# Patient Record
Sex: Male | Born: 1960 | Race: White | Hispanic: No | Marital: Single | Smoking: Never smoker
Health system: Southern US, Community
[De-identification: ages and names within clinical notes are randomized; demographics above are authoritative.]

## PROBLEM LIST (undated history)

## (undated) DIAGNOSIS — I10 Essential (primary) hypertension: Secondary | ICD-10-CM

## (undated) DIAGNOSIS — E119 Type 2 diabetes mellitus without complications: Secondary | ICD-10-CM

## (undated) DIAGNOSIS — E785 Hyperlipidemia, unspecified: Secondary | ICD-10-CM

## (undated) DIAGNOSIS — T7840XA Allergy, unspecified, initial encounter: Secondary | ICD-10-CM

## (undated) HISTORY — DX: Hyperlipidemia, unspecified: E78.5

## (undated) HISTORY — DX: Allergy, unspecified, initial encounter: T78.40XA

## (undated) HISTORY — DX: Essential (primary) hypertension: I10

---

## 1997-10-08 ENCOUNTER — Ambulatory Visit (HOSPITAL_COMMUNITY): Admission: RE | Admit: 1997-10-08 | Discharge: 1997-10-08 | Payer: Self-pay | Admitting: Obstetrics and Gynecology

## 1997-12-28 ENCOUNTER — Inpatient Hospital Stay (HOSPITAL_COMMUNITY): Admission: AD | Admit: 1997-12-28 | Discharge: 1997-12-28 | Payer: Self-pay | Admitting: Obstetrics and Gynecology

## 1997-12-29 ENCOUNTER — Inpatient Hospital Stay (HOSPITAL_COMMUNITY): Admission: AD | Admit: 1997-12-29 | Discharge: 1997-12-31 | Payer: Self-pay | Admitting: Obstetrics and Gynecology

## 1998-05-28 ENCOUNTER — Other Ambulatory Visit: Admission: RE | Admit: 1998-05-28 | Discharge: 1998-05-28 | Payer: Self-pay | Admitting: Obstetrics and Gynecology

## 2004-05-01 HISTORY — PX: VASECTOMY: SHX75

## 2012-09-17 ENCOUNTER — Encounter: Payer: Self-pay | Admitting: Family Medicine

## 2012-09-17 ENCOUNTER — Ambulatory Visit (INDEPENDENT_AMBULATORY_CARE_PROVIDER_SITE_OTHER): Payer: BC Managed Care – PPO | Admitting: Family Medicine

## 2012-09-17 VITALS — BP 146/96 | HR 76 | Temp 98.3°F | Resp 18 | Ht 66.5 in | Wt 208.0 lb

## 2012-09-17 DIAGNOSIS — T7840XA Allergy, unspecified, initial encounter: Secondary | ICD-10-CM | POA: Insufficient documentation

## 2012-09-17 DIAGNOSIS — H04309 Unspecified dacryocystitis of unspecified lacrimal passage: Secondary | ICD-10-CM

## 2012-09-17 DIAGNOSIS — E1169 Type 2 diabetes mellitus with other specified complication: Secondary | ICD-10-CM | POA: Insufficient documentation

## 2012-09-17 DIAGNOSIS — I152 Hypertension secondary to endocrine disorders: Secondary | ICD-10-CM | POA: Insufficient documentation

## 2012-09-17 DIAGNOSIS — I10 Essential (primary) hypertension: Secondary | ICD-10-CM | POA: Insufficient documentation

## 2012-09-17 DIAGNOSIS — H04302 Unspecified dacryocystitis of left lacrimal passage: Secondary | ICD-10-CM

## 2012-09-17 DIAGNOSIS — E785 Hyperlipidemia, unspecified: Secondary | ICD-10-CM | POA: Insufficient documentation

## 2012-09-17 MED ORDER — SULFAMETHOXAZOLE-TRIMETHOPRIM 800-160 MG PO TABS: 1.0000 | ORAL_TABLET | Freq: Two times a day (BID) | ORAL | Status: DC

## 2012-09-17 NOTE — Progress Notes (Signed)
  Subjective:    Patient ID: Joseph [name], male    DOB: 09-09-1960, 52 y.o.   MRN: [id number]  HPI Patient reports 2-3 days of swelling and pain in his left eye. The upper eyelid is erythematous, edematous and swollen. The medial corner of the eye is swollen with a palpable swollen lacrimal duct. There is no conjunctivitis, blurred vision, or pain with extraocular movement. Past Medical History  Diagnosis Date  . Hyperlipidemia   . Hypertension   . Allergy    No current outpatient prescriptions on file prior to visit.   No current facility-administered medications on file prior to visit.   No Known Allergies History   Social History  . Marital Status: Married    Spouse Name: N/A    Number of Children: N/A  . Years of Education: N/A   Occupational History  . Not on file.   Social History Main Topics  . Smoking status: Never Smoker   . Smokeless tobacco: Never Used  . Alcohol Use: No  . Drug Use: No  . Sexually Active: Not on file   Other Topics Concern  . Not on file   Social History Narrative  . No narrative on file      Review of Systems  All other systems reviewed and are negative.       Objective:   Physical Exam  Vitals reviewed. Eyes: Conjunctivae and EOM are normal. Pupils are equal, round, and reactive to light. Right eye exhibits no chemosis, no discharge, no exudate and no hordeolum. Left eye exhibits no chemosis, no discharge, no exudate and no hordeolum.    Cardiovascular: Normal rate and regular rhythm.   Pulmonary/Chest: Effort normal and breath sounds normal.  There is erythema and swelling in the circled area.  The left upper eyelid is also edematous, erythematous, and swollen        Assessment & Plan:  1. Dacrocystitis, left Bactrim double strength tablets 1 by mouth twice a day for 10 days. He is also to apply warm moist compresses to the area 3 times a day. He is to recheck in 48 hours, sooner if worse. He may need probing of the upper  canniculi if swelling persists.

## 2012-09-19 ENCOUNTER — Telehealth: Payer: Self-pay | Admitting: Family Medicine

## 2012-09-19 NOTE — Telephone Encounter (Signed)
Great. Stay on ABx.

## 2012-09-19 NOTE — Telephone Encounter (Signed)
..  Patient aware per vm 

## 2012-09-30 ENCOUNTER — Telehealth: Payer: Self-pay | Admitting: Family Medicine

## 2012-10-01 NOTE — Telephone Encounter (Signed)
Pt called and told to apply compresses and he is going to call and make eye appt.

## 2012-10-01 NOTE — Telephone Encounter (Signed)
Apply warm compresses TID and I'd like him to see an eye doctor for possible nasolacrimal duct obstruction.  If it gets really bad, come in ASAP.  Does he have an eye doctor?

## 2012-10-04 ENCOUNTER — Encounter: Payer: Self-pay | Admitting: Family Medicine

## 2012-10-04 ENCOUNTER — Ambulatory Visit (INDEPENDENT_AMBULATORY_CARE_PROVIDER_SITE_OTHER): Payer: BC Managed Care – PPO | Admitting: Family Medicine

## 2012-10-04 VITALS — BP 149/100 | HR 68 | Temp 98.1°F | Resp 18 | Ht 66.5 in | Wt 206.0 lb

## 2012-10-04 DIAGNOSIS — I10 Essential (primary) hypertension: Secondary | ICD-10-CM

## 2012-10-04 DIAGNOSIS — L0291 Cutaneous abscess, unspecified: Secondary | ICD-10-CM

## 2012-10-04 DIAGNOSIS — L0390 Cellulitis, unspecified: Secondary | ICD-10-CM

## 2012-10-04 MED ORDER — SULFAMETHOXAZOLE-TRIMETHOPRIM 800-160 MG PO TABS: 1.0000 | ORAL_TABLET | Freq: Two times a day (BID) | ORAL | Status: DC

## 2012-10-04 NOTE — Progress Notes (Signed)
Subjective:    Patient ID: Joseph [name], male    DOB: 1960-05-15, 52 y.o.   MRN: [id number]  HPI 09/17/12 Patient reports 2-3 days of swelling and pain in his left eye. The upper eyelid is erythematous, edematous and swollen. The medial corner of the eye is swollen with a palpable swollen lacrimal duct. There is no conjunctivitis, blurred vision, or pain with extraocular movement.  Therefore, I diagnosed him with: 1. Dacrocystitis, left Bactrim double strength tablets 1 by mouth twice a day for 10 days. He is also to apply warm moist compresses to the area 3 times a day. He is to recheck in 48 hours, sooner if worse. He may need probing of the upper canniculi if swelling persists.  10/04/12 The redness and swelling is 90% better. He still has a trace amount of erythema just superior to the corner of the left eye. There is no palpable swelling there is a small whitehead in the center of the erythema. Therefore I did not feel that this was a nasolacrimal duct obstruction as much is possible cellulitis. There is still some residual erythema and irritation. There is no conjunctival erythema. He has no pain with extraocular movement and he has no blurred vision. Past Medical History  Diagnosis Date  . Hyperlipidemia   . Hypertension   . Allergy    Current Outpatient Prescriptions on File Prior to Visit  Medication Sig Dispense Refill  . aspirin 81 MG tablet Take 81 mg by mouth daily.      Marland Kitchen atorvastatin (LIPITOR) 80 MG tablet Take 80 mg by mouth at bedtime.      . fluticasone (FLONASE) 50 MCG/ACT nasal spray Place 2 sprays into the nose daily.      . Glucosamine 500 MG CAPS Take 1 capsule by mouth daily.      Marland Kitchen lisinopril-hydrochlorothiazide (PRINZIDE,ZESTORETIC) 20-12.5 MG per tablet Take 1 tablet by mouth daily.      Marland Kitchen loratadine (CLARITIN) 10 MG tablet Take 10 mg by mouth daily as needed for allergies.      . Lysine 500 MG CAPS Take 1 capsule by mouth daily.      . Multiple Vitamins-Minerals  (MULTIVITAMIN PO) Take by mouth.      . sulfamethoxazole-trimethoprim (BACTRIM DS,SEPTRA DS) 800-160 MG per tablet Take 1 tablet by mouth 2 (two) times daily.  20 tablet  0   No current facility-administered medications on file prior to visit.   No Known Allergies History   Social History  . Marital Status: Married    Spouse Name: N/A    Number of Children: N/A  . Years of Education: N/A   Occupational History  . Not on file.   Social History Main Topics  . Smoking status: Never Smoker   . Smokeless tobacco: Never Used  . Alcohol Use: No  . Drug Use: No  . Sexually Active: Not on file   Other Topics Concern  . Not on file   Social History Narrative  . No narrative on file      Review of Systems  All other systems reviewed and are negative.       Objective:   Physical Exam  Vitals reviewed. Eyes: Conjunctivae and EOM are normal. Pupils are equal, round, and reactive to light. Right eye exhibits no chemosis, no discharge, no exudate and no hordeolum. Left eye exhibits no chemosis, no discharge, no exudate and no hordeolum.    Cardiovascular: Normal rate and regular rhythm.   Pulmonary/Chest: Effort normal and  breath sounds normal.  There is erythema in the circled area.          Assessment & Plan:  1. Hypertension Increase the Prinzide 20/12.5-2 tablets by mouth daily.  Recheck blood pressure in 2 weeks.  2. Cellulitis Bactrim double strength tablets 1 by mouth twice a day for an additional 10 days.

## 2012-10-07 ENCOUNTER — Ambulatory Visit: Payer: BC Managed Care – PPO | Admitting: Family Medicine

## 2012-10-21 ENCOUNTER — Encounter: Payer: Self-pay | Admitting: Family Medicine

## 2012-10-21 ENCOUNTER — Ambulatory Visit (INDEPENDENT_AMBULATORY_CARE_PROVIDER_SITE_OTHER): Payer: BC Managed Care – PPO | Admitting: Family Medicine

## 2012-10-21 VITALS — BP 142/90 | HR 72 | Temp 98.1°F | Resp 16 | Wt 209.0 lb

## 2012-10-21 DIAGNOSIS — I10 Essential (primary) hypertension: Secondary | ICD-10-CM

## 2012-10-21 DIAGNOSIS — F411 Generalized anxiety disorder: Secondary | ICD-10-CM

## 2012-10-21 DIAGNOSIS — F418 Other specified anxiety disorders: Secondary | ICD-10-CM

## 2012-10-21 MED ORDER — METOPROLOL SUCCINATE ER 25 MG PO TB24: 25.0000 mg | ORAL_TABLET | Freq: Every day | ORAL | Status: DC

## 2012-10-21 NOTE — Progress Notes (Signed)
  Subjective:    Patient ID: Joseph [name], male    DOB: Oct 11, 1960, 52 y.o.   MRN: [id number]  HPI  Patient is currently taking Zestoretic 20/12.5 tablets 2 tablets by mouth daily. His blood pressure on ranging 140-160 over 90s. He is under a tremendous amount of stress and anxiety. His marriage is breaking apart. They're about to start couples counseling next week. He thinks this is a large part of his blood pressure issues. His previously been well controlled prior to this. He denies any depression. He denies panic attacks. He generally feels anxious. Past Medical History  Diagnosis Date  . Hyperlipidemia   . Hypertension   . Allergy    Current Outpatient Prescriptions on File Prior to Visit  Medication Sig Dispense Refill  . aspirin 81 MG tablet Take 81 mg by mouth daily.      Marland Kitchen atorvastatin (LIPITOR) 80 MG tablet Take 80 mg by mouth at bedtime.      . fluticasone (FLONASE) 50 MCG/ACT nasal spray Place 2 sprays into the nose daily.      . Glucosamine 500 MG CAPS Take 1 capsule by mouth daily.      Marland Kitchen lisinopril-hydrochlorothiazide (PRINZIDE,ZESTORETIC) 20-12.5 MG per tablet Take 2 tablets by mouth daily.       Marland Kitchen loratadine (CLARITIN) 10 MG tablet Take 10 mg by mouth daily as needed for allergies.      . Lysine 500 MG CAPS Take 1 capsule by mouth daily.      . Multiple Vitamins-Minerals (MULTIVITAMIN PO) Take by mouth.       No current facility-administered medications on file prior to visit.   No Known Allergies History   Social History  . Marital Status: Married    Spouse Name: N/A    Number of Children: N/A  . Years of Education: N/A   Occupational History  . Not on file.   Social History Main Topics  . Smoking status: Never Smoker   . Smokeless tobacco: Never Used  . Alcohol Use: No  . Drug Use: No  . Sexually Active: Not on file   Other Topics Concern  . Not on file   Social History Narrative  . No narrative on file     Review of Systems  All other systems  reviewed and are negative.       Objective:   Physical Exam  Vitals reviewed. Constitutional: He appears well-developed and well-nourished.  Cardiovascular: Normal rate, regular rhythm, normal heart sounds and intact distal pulses.  Exam reveals no gallop and no friction rub.   No murmur heard. Pulmonary/Chest: Effort normal and breath sounds normal. No respiratory distress. He has no wheezes. He has no rales. He exhibits no tenderness.          Assessment & Plan:  1. HTN (hypertension) At metoprolol XL 25 mg by mouth daily to try to address his elevated blood pressure as well as possibly help some with the situational anxiety. We will hold off on SSRIs and anxiolytics at the present time.  2. Situational anxiety Encouraged the patient to follow up with marriage counseling they're pursuing.  Recheck blood pressure in 2 weeks.

## 2012-12-16 ENCOUNTER — Telehealth: Payer: Self-pay | Admitting: Family Medicine

## 2012-12-16 MED ORDER — METOPROLOL SUCCINATE ER 25 MG PO TB24: 25.0000 mg | ORAL_TABLET | Freq: Every day | ORAL | Status: DC

## 2012-12-16 NOTE — Telephone Encounter (Signed)
Metoprolol Succ ER 25 mg tab 1 QD #90

## 2012-12-16 NOTE — Telephone Encounter (Signed)
Rx Refilled  

## 2012-12-31 ENCOUNTER — Telehealth: Payer: Self-pay | Admitting: Family Medicine

## 2012-12-31 MED ORDER — ATORVASTATIN CALCIUM 80 MG PO TABS: 80.0000 mg | ORAL_TABLET | Freq: Every day | ORAL | Status: DC

## 2012-12-31 NOTE — Telephone Encounter (Signed)
Atorvastatin 80 mg tab 1 QHS #90

## 2013-01-09 ENCOUNTER — Other Ambulatory Visit: Payer: Self-pay | Admitting: Family Medicine

## 2013-01-09 DIAGNOSIS — Z0000 Encounter for general adult medical examination without abnormal findings: Secondary | ICD-10-CM

## 2013-01-14 ENCOUNTER — Other Ambulatory Visit: Payer: BC Managed Care – PPO

## 2013-01-14 DIAGNOSIS — Z0000 Encounter for general adult medical examination without abnormal findings: Secondary | ICD-10-CM

## 2013-01-14 LAB — COMPREHENSIVE METABOLIC PANEL
BUN: 16 mg/dL (ref 6–23)
CO2: 30 mEq/L (ref 19–32)
Glucose, Bld: 101 mg/dL — ABNORMAL HIGH (ref 70–99)
Sodium: 140 mEq/L (ref 135–145)
Total Bilirubin: 0.8 mg/dL (ref 0.3–1.2)
Total Protein: 6.7 g/dL (ref 6.0–8.3)

## 2013-01-14 LAB — CBC WITH DIFFERENTIAL/PLATELET
Eosinophils Absolute: 0.1 10*3/uL (ref 0.0–0.7)
Eosinophils Relative: 2 % (ref 0–5)
HCT: 43.2 % (ref 39.0–52.0)
Hemoglobin: 15.2 g/dL (ref 13.0–17.0)
Lymphs Abs: 1.9 10*3/uL (ref 0.7–4.0)
MCH: 30.5 pg (ref 26.0–34.0)
MCV: 86.6 fL (ref 78.0–100.0)
Monocytes Absolute: 0.8 10*3/uL (ref 0.1–1.0)
Monocytes Relative: 11 % (ref 3–12)
Neutrophils Relative %: 58 % (ref 43–77)
RBC: 4.99 MIL/uL (ref 4.22–5.81)

## 2013-01-14 LAB — LIPID PANEL
Cholesterol: 113 mg/dL (ref 0–200)
Triglycerides: 144 mg/dL (ref <150)
VLDL: 29 mg/dL (ref 0–40)

## 2013-01-15 ENCOUNTER — Other Ambulatory Visit: Payer: Self-pay | Admitting: Family Medicine

## 2013-01-17 ENCOUNTER — Encounter: Payer: Self-pay | Admitting: Family Medicine

## 2013-01-17 ENCOUNTER — Ambulatory Visit (INDEPENDENT_AMBULATORY_CARE_PROVIDER_SITE_OTHER): Payer: BC Managed Care – PPO | Admitting: Family Medicine

## 2013-01-17 VITALS — BP 110/70 | HR 60 | Temp 97.9°F | Resp 14 | Ht 68.0 in | Wt 205.0 lb

## 2013-01-17 DIAGNOSIS — Z23 Encounter for immunization: Secondary | ICD-10-CM

## 2013-01-17 DIAGNOSIS — Z0000 Encounter for general adult medical examination without abnormal findings: Secondary | ICD-10-CM

## 2013-01-17 NOTE — Progress Notes (Signed)
Subjective:    Patient ID: Joseph [name], male    DOB: 09-Jul-1960, 52 y.o.   MRN: [id number]  HPI  Patient is a very pleasant 52 year old Caucasian male who is here today for his complete physical exam. Since I have last seen him he has changed his lifestyle dramatically. He is exercising every day. He is running. His blood pressure is much improved at 110/78 he is watching his diet. He is decreased snacking. Overall he is lost approximately 20 pounds. His most recent lab work is listed below: Appointment on 01/14/2013  Component Date Value Range Status  . WBC 01/14/2013 6.6  4.0 - 10.5 K/uL Final  . RBC 01/14/2013 4.99  4.22 - 5.81 MIL/uL Final  . Hemoglobin 01/14/2013 15.2  13.0 - 17.0 g/dL Final  . HCT 82/95/6213 43.2  39.0 - 52.0 % Final  . MCV 01/14/2013 86.6  78.0 - 100.0 fL Final  . MCH 01/14/2013 30.5  26.0 - 34.0 pg Final  . MCHC 01/14/2013 35.2  30.0 - 36.0 g/dL Final  . RDW 08/65/7846 13.9  11.5 - 15.5 % Final  . Platelets 01/14/2013 294  150 - 400 K/uL Final  . Neutrophils Relative % 01/14/2013 58  43 - 77 % Final  . Neutro Abs 01/14/2013 3.8  1.7 - 7.7 K/uL Final  . Lymphocytes Relative 01/14/2013 29  12 - 46 % Final  . Lymphs Abs 01/14/2013 1.9  0.7 - 4.0 K/uL Final  . Monocytes Relative 01/14/2013 11  3 - 12 % Final  . Monocytes Absolute 01/14/2013 0.8  0.1 - 1.0 K/uL Final  . Eosinophils Relative 01/14/2013 2  0 - 5 % Final  . Eosinophils Absolute 01/14/2013 0.1  0.0 - 0.7 K/uL Final  . Basophils Relative 01/14/2013 0  0 - 1 % Final  . Basophils Absolute 01/14/2013 0.0  0.0 - 0.1 K/uL Final  . Smear Review 01/14/2013 Criteria for review not met   Final  . Sodium 01/14/2013 140  135 - 145 mEq/L Final  . Potassium 01/14/2013 3.3* 3.5 - 5.3 mEq/L Final  . Chloride 01/14/2013 101  96 - 112 mEq/L Final  . CO2 01/14/2013 30  19 - 32 mEq/L Final  . Glucose, Bld 01/14/2013 101* 70 - 99 mg/dL Final  . BUN 96/29/5284 16  6 - 23 mg/dL Final  . Creat 13/24/4010 1.04  0.50 - 1.35  mg/dL Final  . Total Bilirubin 01/14/2013 0.8  0.3 - 1.2 mg/dL Final  . Alkaline Phosphatase 01/14/2013 55  39 - 117 U/L Final  . AST 01/14/2013 21  0 - 37 U/L Final  . ALT 01/14/2013 26  0 - 53 U/L Final  . Total Protein 01/14/2013 6.7  6.0 - 8.3 g/dL Final  . Albumin 27/25/3664 4.1  3.5 - 5.2 g/dL Final  . Calcium 40/34/7425 9.4  8.4 - 10.5 mg/dL Final  . Cholesterol 95/63/8756 113  0 - 200 mg/dL Final   Comment: ATP III Classification:                                < 200        mg/dL        Desirable                               200 - 239     mg/dL  Borderline High                               >= 240        mg/dL        High                             . Triglycerides 01/14/2013 144  <150 mg/dL Final  . HDL 13/24/4010 40  >39 mg/dL Final  . Total CHOL/HDL Ratio 01/14/2013 2.8   Final  . VLDL 01/14/2013 29  0 - 40 mg/dL Final  . LDL Cholesterol 01/14/2013 44  0 - 99 mg/dL Final   Comment:                            Total Cholesterol/HDL Ratio:CHD Risk                                                 Coronary Heart Disease Risk Table                                                                 Men       Women                                   1/2 Average Risk              3.4        3.3                                       Average Risk              5.0        4.4                                    2X Average Risk              9.6        7.1                                    3X Average Risk             23.4       11.0                          Use the calculated Patient Ratio above and the CHD Risk table                           to determine the patient's CHD Risk.  ATP III Classification (LDL):                                < 100        mg/dL         Optimal                               100 - 129     mg/dL         Near or Above Optimal                               130 - 159     mg/dL         Borderline High                               160 -  189     mg/dL         High                                > 190        mg/dL         Very High                             . PSA 01/14/2013 1.57  <=4.00 ng/mL Final   Comment: Test Methodology: ECLIA PSA (Electrochemiluminescence Immunoassay)                                                     For PSA values from 2.5-4.0, particularly in younger men <60 years                          old, the AUA and NCCN suggest testing for % Free PSA (3515) and                          evaluation of the rate of increase in PSA (PSA velocity).   Past Medical History  Diagnosis Date  . Hyperlipidemia   . Hypertension   . Allergy    Current Outpatient Prescriptions on File Prior to Visit  Medication Sig Dispense Refill  . aspirin 81 MG tablet Take 81 mg by mouth daily.      Marland Kitchen atorvastatin (LIPITOR) 80 MG tablet Take 1 tablet (80 mg total) by mouth at bedtime.  90 tablet  1  . fluticasone (FLONASE) 50 MCG/ACT nasal spray Place 2 sprays into the nose daily.      . Glucosamine 500 MG CAPS Take 1 capsule by mouth daily.      Marland Kitchen lisinopril-hydrochlorothiazide (PRINZIDE,ZESTORETIC) 20-12.5 MG per tablet TAKE 2 TABLETS BY MOUTH DAILY  180 tablet  3  . loratadine (CLARITIN) 10 MG tablet Take 10 mg by mouth daily as needed for allergies.      . Lysine 500 MG CAPS Take 1 capsule by mouth daily.      . metoprolol succinate (  TOPROL-XL) 25 MG 24 hr tablet Take 1 tablet (25 mg total) by mouth daily.  90 tablet  1  . Multiple Vitamins-Minerals (MULTIVITAMIN PO) Take by mouth.       No current facility-administered medications on file prior to visit.   No Known Allergies History   Social History  . Marital Status: Married    Spouse Name: N/A    Number of Children: N/A  . Years of Education: N/A   Occupational History  . Not on file.   Social History Main Topics  . Smoking status: Never Smoker   . Smokeless tobacco: Never Used  . Alcohol Use: No  . Drug Use: No  . Sexual Activity: Yes     Comment:  married to [place].  Three kids.  exercising daily.   Other Topics Concern  . Not on file   Social History Narrative  . No narrative on file   Family History  Problem Relation Age of Onset  . Heart disease Mother   . Heart disease Father   . Stroke Father   . Hypertension Sister      Review of Systems  All other systems reviewed and are negative.       Objective:   Physical Exam  Vitals reviewed. Constitutional: He is oriented to person, place, and time. He appears well-developed and well-nourished. No distress.  HENT:  Head: Normocephalic and atraumatic.  Right Ear: External ear normal.  Left Ear: External ear normal.  Nose: Nose normal.  Mouth/Throat: Oropharynx is clear and moist. No oropharyngeal exudate.  Eyes: Conjunctivae and EOM are normal. Pupils are equal, round, and reactive to light. Right eye exhibits no discharge. Left eye exhibits no discharge. No scleral icterus.  Neck: Normal range of motion. Neck supple. No JVD present. No tracheal deviation present. No thyromegaly present.  Cardiovascular: Normal rate, regular rhythm, normal heart sounds and intact distal pulses.  Exam reveals no gallop and no friction rub.   No murmur heard. Pulmonary/Chest: Effort normal and breath sounds normal. No stridor. No respiratory distress. He has no wheezes. He has no rales. He exhibits no tenderness.  Abdominal: Soft. Bowel sounds are normal. He exhibits no distension and no mass. There is no tenderness. There is no rebound and no guarding.  Genitourinary: Rectum normal and prostate normal.  Musculoskeletal: Normal range of motion. He exhibits no edema and no tenderness.  Lymphadenopathy:    He has no cervical adenopathy.  Neurological: He is alert and oriented to person, place, and time. He has normal reflexes. He displays normal reflexes. No cranial nerve deficit. He exhibits normal muscle tone. Coordination normal.  Skin: Skin is warm. No rash noted. He is not diaphoretic.  No erythema. No pallor.  Psychiatric: He has a normal mood and affect. His behavior is normal. Judgment and thought content normal.          Assessment & Plan:  1. Need for prophylactic vaccination and inoculation against influenza - Flu Vaccine QUAD 36+ mos IM  2. Routine general medical examination at a health care facility Patient's tetanus shot is up-to-date. He was given his flu shot today. His prostate exam is normal. His blood pressure is much improved. I recommend weaning him off the Toprol.  He will keep an eye on his blood pressure at home and call me if it gets higher than 140/90.  His cholesterol is excellent. I recommended decreasing atorvastatin to 40 mg by mouth daily. He will break the 80 mg pills in half.  I applauded the patient on his lifestyle changes, it is paying off. I will schedule him for colonoscopy for screening for colon cancer.

## 2013-02-07 ENCOUNTER — Encounter: Payer: Self-pay | Admitting: Internal Medicine

## 2013-03-24 ENCOUNTER — Ambulatory Visit (AMBULATORY_SURGERY_CENTER): Payer: Self-pay | Admitting: *Deleted

## 2013-03-24 VITALS — Ht 68.0 in | Wt 207.2 lb

## 2013-03-24 DIAGNOSIS — Z1211 Encounter for screening for malignant neoplasm of colon: Secondary | ICD-10-CM

## 2013-03-24 MED ORDER — NA SULFATE-K SULFATE-MG SULF 17.5-3.13-1.6 GM/177ML PO SOLN: 1.0000 | Freq: Once | ORAL | Status: DC

## 2013-03-24 NOTE — Progress Notes (Signed)
No allergies to eggs or soy. No problems with anesthesia.  

## 2013-03-25 ENCOUNTER — Encounter: Payer: Self-pay | Admitting: Internal Medicine

## 2013-03-31 ENCOUNTER — Encounter: Payer: Self-pay | Admitting: Internal Medicine

## 2013-03-31 ENCOUNTER — Ambulatory Visit (AMBULATORY_SURGERY_CENTER): Payer: BC Managed Care – PPO | Admitting: Internal Medicine

## 2013-03-31 VITALS — BP 134/92 | HR 59 | Temp 98.3°F | Resp 23 | Ht 68.0 in | Wt 207.0 lb

## 2013-03-31 DIAGNOSIS — Z1211 Encounter for screening for malignant neoplasm of colon: Secondary | ICD-10-CM

## 2013-03-31 DIAGNOSIS — K5730 Diverticulosis of large intestine without perforation or abscess without bleeding: Secondary | ICD-10-CM

## 2013-03-31 MED ORDER — SODIUM CHLORIDE 0.9 % IV SOLN: 500.0000 mL | INTRAVENOUS | Status: DC

## 2013-03-31 NOTE — Op Note (Signed)
[facility] [address] [place], [address]   COLONOSCOPY PROCEDURE REPORT  PATIENT: Joseph [name], Joseph [name]  MR#: [id number] BIRTHDATE: Oct 15, 1960 , 52  yrs. old GENDER: Male ENDOSCOPIST: Iva Boop, MD, [facility] REFERRED XB:[id number] Tanya Nones, M.D. PROCEDURE DATE:  03/31/2013 PROCEDURE:   Colonoscopy, screening First Screening Colonoscopy - Avg.  risk and is 50 yrs.  old or older Yes.  Prior Negative Screening - Now for repeat screening. N/A  History of Adenoma - Now for follow-up colonoscopy & has been > or = to 3 yrs.  N/A  Polyps Removed Today? No.  Recommend repeat exam, <10 yrs? No. ASA CLASS:   Class II INDICATIONS:average risk screening and first colonoscopy. MEDICATIONS: propofol (Diprivan) 300mg  IV, MAC sedation, administered by CRNA, and These medications were titrated to patient response per physician's verbal order  DESCRIPTION OF PROCEDURE:   After the risks benefits and alternatives of the procedure were thoroughly explained, informed consent was obtained.  A digital rectal exam revealed no abnormalities of the rectum, A digital rectal exam revealed no prostatic nodules, and A digital rectal exam revealed the prostate was not enlarged.   The LB [id number]  endoscope was introduced through the anus and advanced to the cecum, which was identified by both the appendix and ileocecal valve. No adverse events experienced.   The quality of the prep was excellent using Suprep  The instrument was then slowly withdrawn as the colon was fully examined.      COLON FINDINGS: Moderate diverticulosis was noted in the sigmoid colon.   The colon mucosa was otherwise normal.  Retroflexed views revealed no abnormalities. The time to cecum=2 minutes 13 seconds. Withdrawal time=8 minutes 19 seconds.  The scope was withdrawn and the procedure completed. COMPLICATIONS: There were no complications.  ENDOSCOPIC IMPRESSION: 1.   Moderate diverticulosis was noted in the  sigmoid colon 2.   The colon mucosa was otherwise normal  RECOMMENDATIONS: Repeat colonoscopy 10 years - 2024   eSigned:  Iva Boop, MD, [facility] 03/31/2013 1:49 PM cc: Lynnea Ferrier, MD and The Patient

## 2013-03-31 NOTE — Patient Instructions (Addendum)
No polyps or cancer seen today. You do have a condition called diverticulosis - common and not usually a problem. Please read the handout provided.  Next routine colonoscopy in 10 years - 2024.  I appreciate the opportunity to care for you. Iva Boop, MD, FACG  YOU HAD AN ENDOSCOPIC PROCEDURE TODAY AT THE [facility]: Refer to the procedure report that was given to you for any specific questions about what was found during the examination.  If the procedure report does not answer your questions, please call your gastroenterologist to clarify.  If you requested that your care partner not be given the details of your procedure findings, then the procedure report has been included in a sealed envelope for you to review at your convenience later.  YOU SHOULD EXPECT: Some feelings of bloating in the abdomen. Passage of more gas than usual.  Walking can help get rid of the air that was put into your GI tract during the procedure and reduce the bloating. If you had a lower endoscopy (such as a colonoscopy or flexible sigmoidoscopy) you may notice spotting of blood in your stool or on the toilet paper. If you underwent a bowel prep for your procedure, then you may not have a normal bowel movement for a few days.  DIET: Your first meal following the procedure should be a light meal and then it is ok to progress to your normal diet.  A half-sandwich or bowl of soup is an example of a good first meal.  Heavy or fried foods are harder to digest and may make you feel nauseous or bloated.  Likewise meals heavy in dairy and vegetables can cause extra gas to form and this can also increase the bloating.  Drink plenty of fluids but you should avoid alcoholic beverages for 24 hours.  ACTIVITY: Your care partner should take you home directly after the procedure.  You should plan to take it easy, moving slowly for the rest of the day.  You can resume normal activity the day after the procedure however  you should NOT DRIVE or use heavy machinery for 24 hours (because of the sedation medicines used during the test).    SYMPTOMS TO REPORT IMMEDIATELY: A gastroenterologist can be reached at any hour.  During normal business hours, 8:30 AM to 5:00 PM Monday through Friday, call [phone].  After hours and on weekends, please call the GI answering service at [phone] who will take a message and have the physician on call contact you.   Following lower endoscopy (colonoscopy or flexible sigmoidoscopy):  Excessive amounts of blood in the stool  Significant tenderness or worsening of abdominal pains  Swelling of the abdomen that is new, acute  Fever of 100F or higher    FOLLOW UP: If any biopsies were taken you will be contacted by phone or by letter within the next 1-3 weeks.  Call your gastroenterologist if you have not heard about the biopsies in 3 weeks.  Our staff will call the home number listed on your records the next business day following your procedure to check on you and address any questions or concerns that you may have at that time regarding the information given to you following your procedure. This is a courtesy call and so if there is no answer at the home number and we have not heard from you through the emergency physician on call, we will assume that you have returned to your regular daily activities without  incident.  SIGNATURES/CONFIDENTIALITY: You and/or your care partner have signed paperwork which will be entered into your electronic medical record.  These signatures attest to the fact that that the information above on your After Visit Summary has been reviewed and is understood.  Full responsibility of the confidentiality of this discharge information lies with you and/or your care-partner.   Diverticulosis information given.  Next Colonoscopy 10 years-2024

## 2013-03-31 NOTE — Progress Notes (Signed)
Patient did not experience any of the following events: a burn prior to discharge; a fall within the facility; wrong site/side/patient/procedure/implant event; or a hospital transfer or hospital admission upon discharge from the facility. (G8907) Patient did not have preoperative order for IV antibiotic SSI prophylaxis. (G8918)  

## 2013-04-01 ENCOUNTER — Telehealth: Payer: Self-pay | Admitting: *Deleted

## 2013-04-01 NOTE — Telephone Encounter (Signed)
  Follow up Call-  Call back number 03/31/2013  Post procedure Call Back phone  # [phone]  Permission to leave phone message Yes     Patient questions:  Do you have a fever, pain , or abdominal swelling? no Pain Score  0 *  Have you tolerated food without any problems? yes  Have you been able to return to your normal activities? yes  Do you have any questions about your discharge instructions: Diet   no Medications  no Follow up visit  no  Do you have questions or concerns about your Care? no  Actions: * If pain score is 4 or above: No action needed, pain <4.

## 2013-05-01 HISTORY — PX: COLONOSCOPY: SHX174

## 2013-05-30 ENCOUNTER — Telehealth: Payer: Self-pay | Admitting: Family Medicine

## 2013-05-30 MED ORDER — FLUTICASONE PROPIONATE 50 MCG/ACT NA SUSP: 2.0000 | Freq: Every day | NASAL | Status: DC

## 2013-05-30 NOTE — Telephone Encounter (Signed)
Medication refilled per protocol. 

## 2013-06-26 ENCOUNTER — Other Ambulatory Visit: Payer: Self-pay | Admitting: Family Medicine

## 2013-07-21 ENCOUNTER — Other Ambulatory Visit: Payer: Self-pay | Admitting: Family Medicine

## 2013-07-21 MED ORDER — FLUTICASONE PROPIONATE 50 MCG/ACT NA SUSP: 2.0000 | Freq: Every day | NASAL | Status: DC

## 2013-07-21 NOTE — Telephone Encounter (Signed)
Rx Refilled  

## 2013-07-23 ENCOUNTER — Other Ambulatory Visit: Payer: Self-pay | Admitting: Family Medicine

## 2013-07-23 MED ORDER — FLUTICASONE PROPIONATE 50 MCG/ACT NA SUSP: 2.0000 | Freq: Every day | NASAL | Status: DC

## 2013-07-23 NOTE — Telephone Encounter (Signed)
Rx refilled for 90 day supply. 

## 2013-08-04 ENCOUNTER — Ambulatory Visit: Payer: BC Managed Care – PPO | Admitting: Family Medicine

## 2013-08-12 ENCOUNTER — Ambulatory Visit: Payer: BC Managed Care – PPO | Admitting: Family Medicine

## 2013-08-25 ENCOUNTER — Ambulatory Visit (INDEPENDENT_AMBULATORY_CARE_PROVIDER_SITE_OTHER): Payer: BC Managed Care – PPO | Admitting: Family Medicine

## 2013-08-25 ENCOUNTER — Encounter: Payer: Self-pay | Admitting: Family Medicine

## 2013-08-25 ENCOUNTER — Encounter (INDEPENDENT_AMBULATORY_CARE_PROVIDER_SITE_OTHER): Payer: Self-pay

## 2013-08-25 VITALS — BP 130/90 | HR 72 | Temp 96.6°F | Resp 16 | Ht 68.0 in | Wt 218.0 lb

## 2013-08-25 DIAGNOSIS — Q828 Other specified congenital malformations of skin: Secondary | ICD-10-CM

## 2013-08-25 NOTE — Progress Notes (Signed)
   Subjective:    Patient ID: Joseph [name], male    DOB: [birth year], 53 y.o.   MRN: [id number]  HPI Patient presents to the skin tags he would like removed. He has 2 small skin tags on his left side of his neck each approximately 2 mm in size. He has 3 large skin tags in his right inguinal canal one 6 mm in size, one 4 mm in size, and one 3 mm in size. He has two skin tags in his left inguinal canal each approximately 4 mm in size.  There are no concerning features or cancers features about these. Past Medical History  Diagnosis Date  . Hyperlipidemia   . Hypertension   . Allergy    Past Surgical History  Procedure Laterality Date  . Vasectomy  2006   Current Outpatient Prescriptions on File Prior to Visit  Medication Sig Dispense Refill  . aspirin 81 MG tablet Take 81 mg by mouth daily.      Marland Kitchen atorvastatin (LIPITOR) 80 MG tablet TAKE 1 TABLET (80 MG TOTAL) BY MOUTH AT BEDTIME.  90 tablet  1  . fluticasone (FLONASE) 50 MCG/ACT nasal spray Place 2 sprays into both nostrils daily.  48 g  4  . Glucosamine 500 MG CAPS Take 1 capsule by mouth daily.      Marland Kitchen lisinopril-hydrochlorothiazide (PRINZIDE,ZESTORETIC) 20-12.5 MG per tablet TAKE 2 TABLETS BY MOUTH DAILY  180 tablet  3  . loratadine (CLARITIN) 10 MG tablet Take 10 mg by mouth daily as needed for allergies.      . Lysine 500 MG CAPS Take 1 capsule by mouth daily.      . Multiple Vitamins-Minerals (MULTIVITAMIN PO) Take by mouth.       No current facility-administered medications on file prior to visit.   History   Social History  . Marital Status: Married    Spouse Name: N/A    Number of Children: N/A  . Years of Education: N/A   Occupational History  . Not on file.   Social History Main Topics  . Smoking status: Never Smoker   . Smokeless tobacco: Never Used  . Alcohol Use: No  . Drug Use: No  . Sexual Activity: Yes     Comment: married to [place].  Three kids.  exercising daily.   Other Topics Concern  . Not on file    Social History Narrative  . No narrative on file      Review of Systems  All other systems reviewed and are negative.      Objective:   Physical Exam  Vitals reviewed. Cardiovascular: Normal rate and regular rhythm.   Pulmonary/Chest: Effort normal and breath sounds normal.  Skin tags as described and listed in the history of present illness        Assessment & Plan:  1. Accessory skin tags Each on the nine skin tags were anesthetized with 0.1% lidocaine with epinephrine. Using sterile technique, each of the skin tags was then removed via shave biopsy. Hemostasis was achieved with Drysol and a Band-Aid. The patient tolerated the procedure well with no complications. Followup when necessary.

## 2013-10-13 ENCOUNTER — Ambulatory Visit (INDEPENDENT_AMBULATORY_CARE_PROVIDER_SITE_OTHER): Payer: BC Managed Care – PPO | Admitting: Physician Assistant

## 2013-10-13 ENCOUNTER — Encounter: Payer: Self-pay | Admitting: Physician Assistant

## 2013-10-13 VITALS — BP 136/88 | HR 76 | Temp 97.8°F | Resp 18 | Wt 218.0 lb

## 2013-10-13 DIAGNOSIS — J329 Chronic sinusitis, unspecified: Secondary | ICD-10-CM

## 2013-10-13 MED ORDER — AMOXICILLIN-POT CLAVULANATE 875-125 MG PO TABS: 1.0000 | ORAL_TABLET | Freq: Two times a day (BID) | ORAL | Status: DC

## 2013-10-13 NOTE — Progress Notes (Signed)
    Patient ID: Joseph [name] MRN: [id number], DOB: 1960/05/30, 53 y.o. Date of Encounter: 10/13/2013, 3:08 PM    Chief Complaint:  Chief Complaint  Patient presents with  . sinus headache and pain x 2 weeks     HPI: 53 y.o. year old white male reports sinus headache pain for the past 2 weeks. When he bends over he can fill increased pressure and dizzy sensation. Says that he just sneezed and got out yellow mucus. He generally has not been a to get much out feels like it's very congested and stopped up. Has had some cough secondary to drainage down his throat but has had no chest congestion. Had some fever one day. No other known fevers or chills. No significant sore throat or ear pain.     Home Meds:   Outpatient Prescriptions Prior to Visit  Medication Sig Dispense Refill  . aspirin 81 MG tablet Take 81 mg by mouth daily.      Marland Kitchen atorvastatin (LIPITOR) 80 MG tablet TAKE 1 TABLET (80 MG TOTAL) BY MOUTH AT BEDTIME.  90 tablet  1  . fluticasone (FLONASE) 50 MCG/ACT nasal spray Place 2 sprays into both nostrils daily.  48 g  4  . Glucosamine 500 MG CAPS Take 1 capsule by mouth daily.      Marland Kitchen lisinopril-hydrochlorothiazide (PRINZIDE,ZESTORETIC) 20-12.5 MG per tablet TAKE 2 TABLETS BY MOUTH DAILY  180 tablet  3  . loratadine (CLARITIN) 10 MG tablet Take 10 mg by mouth daily as needed for allergies.      . Lysine 500 MG CAPS Take 1 capsule by mouth daily.      . Multiple Vitamins-Minerals (MULTIVITAMIN PO) Take by mouth.       No facility-administered medications prior to visit.    Allergies: No Known Allergies    Review of Systems: See HPI for pertinent ROS. All other ROS negative.    Physical Exam: Blood pressure 136/88, pulse 76, temperature 97.8 F (36.6 C), temperature source Oral, resp. rate 18, weight 218 lb (98.884 kg)., Body mass index is 33.15 kg/(m^2). General: WNWD WM.  Appears in no acute distress. HEENT: Normocephalic, atraumatic, eyes without discharge, sclera  non-icteric, nares are without discharge. Bilateral auditory canals clear, TM's are without perforation, pearly grey and translucent with reflective cone of light bilaterally. Oral cavity moist, posterior pharynx without exudate, erythema, peritonsillar abscess. Positive tenderness with percussion of bilateral maxillary sinuses. Minimal tenderness with percussion of frontal sinuses.  Neck: Supple. No thyromegaly. No lymphadenopathy. Lungs: Clear bilaterally to auscultation without wheezes, rales, or rhonchi. Breathing is unlabored. Heart: Regular rhythm. No murmurs, rubs, or gallops. Msk:  Strength and tone normal for age. Extremities/Skin: Warm and dry. Neuro: Alert and oriented X 3. Moves all extremities spontaneously. Gait is normal. CNII-XII grossly in tact. Psych:  Responds to questions appropriately with a normal affect.     ASSESSMENT AND PLAN:  53 y.o. year old male with  1. Sinusitis - amoxicillin-clavulanate (AUGMENTIN) 875-125 MG per tablet; Take 1 tablet by mouth 2 (two) times daily.  Dispense: 20 tablet; Refill: 0 Complete all of antibiotic. Offered Prednisone but he defers. Says it caused significant insomnia in the past for him. cont over-the-counter decongestants for symptom relief. Follow up if symptoms do not resolve with completion of antibiotic.  Marin Olp [place], [place], [facility] 10/13/2013 3:08 PM

## 2013-12-25 ENCOUNTER — Other Ambulatory Visit: Payer: Self-pay | Admitting: Family Medicine

## 2013-12-25 NOTE — Telephone Encounter (Signed)
Refill appropriate and filled per protocol. 

## 2014-01-07 ENCOUNTER — Other Ambulatory Visit: Payer: Self-pay | Admitting: Family Medicine

## 2014-03-06 ENCOUNTER — Ambulatory Visit (INDEPENDENT_AMBULATORY_CARE_PROVIDER_SITE_OTHER): Payer: BC Managed Care – PPO | Admitting: Family Medicine

## 2014-03-06 DIAGNOSIS — Z23 Encounter for immunization: Secondary | ICD-10-CM

## 2014-04-08 ENCOUNTER — Other Ambulatory Visit: Payer: Self-pay | Admitting: Family Medicine

## 2014-04-20 ENCOUNTER — Encounter: Payer: BC Managed Care – PPO | Admitting: Family Medicine

## 2014-04-28 ENCOUNTER — Other Ambulatory Visit: Payer: BC Managed Care – PPO

## 2014-04-28 DIAGNOSIS — E785 Hyperlipidemia, unspecified: Secondary | ICD-10-CM

## 2014-04-28 DIAGNOSIS — Z79899 Other long term (current) drug therapy: Secondary | ICD-10-CM

## 2014-04-28 DIAGNOSIS — I10 Essential (primary) hypertension: Secondary | ICD-10-CM

## 2014-04-28 DIAGNOSIS — Z0000 Encounter for general adult medical examination without abnormal findings: Secondary | ICD-10-CM

## 2014-04-28 DIAGNOSIS — Z125 Encounter for screening for malignant neoplasm of prostate: Secondary | ICD-10-CM

## 2014-04-28 LAB — LIPID PANEL
CHOL/HDL RATIO: 4.3 ratio
CHOLESTEROL: 145 mg/dL (ref 0–200)
HDL: 34 mg/dL — AB (ref >39)
LDL Cholesterol: 66 mg/dL (ref 0–99)
Triglycerides: 224 mg/dL — ABNORMAL HIGH (ref <150)
VLDL: 45 mg/dL — AB (ref 0–40)

## 2014-04-28 LAB — COMPLETE METABOLIC PANEL WITH GFR
ALT: 52 U/L (ref 0–53)
AST: 28 U/L (ref 0–37)
Albumin: 4.1 g/dL (ref 3.5–5.2)
Alkaline Phosphatase: 59 U/L (ref 39–117)
BUN: 16 mg/dL (ref 6–23)
CALCIUM: 9.4 mg/dL (ref 8.4–10.5)
CO2: 29 meq/L (ref 19–32)
CREATININE: 1.1 mg/dL (ref 0.50–1.35)
Chloride: 101 mEq/L (ref 96–112)
GFR, Est African American: 89 mL/min
GFR, Est Non African American: 77 mL/min
Glucose, Bld: 118 mg/dL — ABNORMAL HIGH (ref 70–99)
Potassium: 3.6 mEq/L (ref 3.5–5.3)
Sodium: 140 mEq/L (ref 135–145)
Total Bilirubin: 0.6 mg/dL (ref 0.2–1.2)
Total Protein: 6.6 g/dL (ref 6.0–8.3)

## 2014-04-28 LAB — CBC WITH DIFFERENTIAL/PLATELET
Basophils Absolute: 0 10*3/uL (ref 0.0–0.1)
Basophils Relative: 0 % (ref 0–1)
Eosinophils Absolute: 0.2 10*3/uL (ref 0.0–0.7)
Eosinophils Relative: 3 % (ref 0–5)
HCT: 45.1 % (ref 39.0–52.0)
Hemoglobin: 15.5 g/dL (ref 13.0–17.0)
LYMPHS PCT: 35 % (ref 12–46)
Lymphs Abs: 2.6 10*3/uL (ref 0.7–4.0)
MCH: 29.8 pg (ref 26.0–34.0)
MCHC: 34.4 g/dL (ref 30.0–36.0)
MCV: 86.6 fL (ref 78.0–100.0)
MPV: 11 fL (ref 8.6–12.4)
Monocytes Absolute: 0.7 10*3/uL (ref 0.1–1.0)
Monocytes Relative: 10 % (ref 3–12)
NEUTROS ABS: 3.8 10*3/uL (ref 1.7–7.7)
NEUTROS PCT: 52 % (ref 43–77)
Platelets: 251 10*3/uL (ref 150–400)
RBC: 5.21 MIL/uL (ref 4.22–5.81)
RDW: 14.4 % (ref 11.5–15.5)
WBC: 7.3 10*3/uL (ref 4.0–10.5)

## 2014-04-28 LAB — TSH: TSH: 1.062 u[IU]/mL (ref 0.350–4.500)

## 2014-04-29 LAB — PSA: PSA: 1.52 ng/mL (ref <=4.00)

## 2014-05-08 ENCOUNTER — Ambulatory Visit (INDEPENDENT_AMBULATORY_CARE_PROVIDER_SITE_OTHER): Payer: BLUE CROSS/BLUE SHIELD | Admitting: Family Medicine

## 2014-05-08 ENCOUNTER — Encounter: Payer: Self-pay | Admitting: Family Medicine

## 2014-05-08 VITALS — BP 142/100 | HR 82 | Temp 98.2°F | Resp 18 | Ht 68.0 in | Wt 228.0 lb

## 2014-05-08 DIAGNOSIS — Z0000 Encounter for general adult medical examination without abnormal findings: Secondary | ICD-10-CM

## 2014-05-08 MED ORDER — FLUTICASONE PROPIONATE 50 MCG/ACT NA SUSP: 2.0000 | Freq: Every day | NASAL | Status: DC

## 2014-05-08 NOTE — Progress Notes (Signed)
Subjective:    Patient ID: Joseph [name], male    DOB: 1961-01-25, 54 y.o.   MRN: [id number]  HPI Patient is a very pleasant 54 year old white male who is here today for complete physical exam. His colonoscopy was performed last year. All his immunizations are up-to-date. His blood pressure today is elevated at 142/100. He states that he is under tremendous amount of stress from work recently. However he has not been checking his blood pressure at home regularly. His most recent lab work as listed below. Patient missed that he is not eating as well as he was last year. Lab on 04/28/2014  Component Date Value Ref Range Status  . Sodium 04/28/2014 140  135 - 145 mEq/L Final  . Potassium 04/28/2014 3.6  3.5 - 5.3 mEq/L Final  . Chloride 04/28/2014 101  96 - 112 mEq/L Final  . CO2 04/28/2014 29  19 - 32 mEq/L Final  . Glucose, Bld 04/28/2014 118* 70 - 99 mg/dL Final  . BUN 04/28/2014 16  6 - 23 mg/dL Final  . Creat 04/28/2014 1.10  0.50 - 1.35 mg/dL Final  . Total Bilirubin 04/28/2014 0.6  0.2 - 1.2 mg/dL Final  . Alkaline Phosphatase 04/28/2014 59  39 - 117 U/L Final  . AST 04/28/2014 28  0 - 37 U/L Final  . ALT 04/28/2014 52  0 - 53 U/L Final  . Total Protein 04/28/2014 6.6  6.0 - 8.3 g/dL Final  . Albumin 04/28/2014 4.1  3.5 - 5.2 g/dL Final  . Calcium 04/28/2014 9.4  8.4 - 10.5 mg/dL Final  . GFR, Est African American 04/28/2014 89   Final  . GFR, Est Non African American 04/28/2014 77   Final   Comment:   The estimated GFR is a calculation valid for adults (>=1 years old) that uses the CKD-EPI algorithm to adjust for age and sex. It is   not to be used for children, pregnant women, hospitalized patients,    patients on dialysis, or with rapidly changing kidney function. According to the NKDEP, eGFR >89 is normal, 60-89 shows mild impairment, 30-59 shows moderate impairment, 15-29 shows severe impairment and <15 is ESRD.     Marland Kitchen TSH 04/28/2014 1.062  0.350 - 4.500 uIU/mL Final  .  Cholesterol 04/28/2014 145  0 - 200 mg/dL Final   Comment: ATP III Classification:       < 200        mg/dL        Desirable      200 - 239     mg/dL        Borderline High      >= 240        mg/dL        High     . Triglycerides 04/28/2014 224* <150 mg/dL Final  . HDL 04/28/2014 34* >39 mg/dL Final  . Total CHOL/HDL Ratio 04/28/2014 4.3   Final  . VLDL 04/28/2014 45* 0 - 40 mg/dL Final  . LDL Cholesterol 04/28/2014 66  0 - 99 mg/dL Final   Comment:   Total Cholesterol/HDL Ratio:CHD Risk                        Coronary Heart Disease Risk Table  Men       Women          1/2 Average Risk              3.4        3.3              Average Risk              5.0        4.4           2X Average Risk              9.6        7.1           3X Average Risk             23.4       11.0 Use the calculated Patient Ratio above and the CHD Risk table  to determine the patient's CHD Risk. ATP III Classification (LDL):       < 100        mg/dL         Optimal      100 - 129     mg/dL         Near or Above Optimal      130 - 159     mg/dL         Borderline High      160 - 189     mg/dL         High       > 190        mg/dL         Very High     . WBC 04/28/2014 7.3  4.0 - 10.5 K/uL Final  . RBC 04/28/2014 5.21  4.22 - 5.81 MIL/uL Final  . Hemoglobin 04/28/2014 15.5  13.0 - 17.0 g/dL Final  . HCT 04/28/2014 45.1  39.0 - 52.0 % Final  . MCV 04/28/2014 86.6  78.0 - 100.0 fL Final  . MCH 04/28/2014 29.8  26.0 - 34.0 pg Final  . MCHC 04/28/2014 34.4  30.0 - 36.0 g/dL Final  . RDW 04/28/2014 14.4  11.5 - 15.5 % Final  . Platelets 04/28/2014 251  150 - 400 K/uL Final  . MPV 04/28/2014 11.0  8.6 - 12.4 fL Final   ** Please note change in reference range(s). **  . Neutrophils Relative % 04/28/2014 52  43 - 77 % Final  . Neutro Abs 04/28/2014 3.8  1.7 - 7.7 K/uL Final  . Lymphocytes Relative 04/28/2014 35  12 - 46 % Final  . Lymphs Abs 04/28/2014 2.6  0.7 - 4.0  K/uL Final  . Monocytes Relative 04/28/2014 10  3 - 12 % Final  . Monocytes Absolute 04/28/2014 0.7  0.1 - 1.0 K/uL Final  . Eosinophils Relative 04/28/2014 3  0 - 5 % Final  . Eosinophils Absolute 04/28/2014 0.2  0.0 - 0.7 K/uL Final  . Basophils Relative 04/28/2014 0  0 - 1 % Final  . Basophils Absolute 04/28/2014 0.0  0.0 - 0.1 K/uL Final  . Smear Review 04/28/2014 Criteria for review not met   Final  . PSA 04/28/2014 1.52  <=4.00 ng/mL Final   Comment: Test Methodology: ECLIA PSA (Electrochemiluminescence Immunoassay)   For PSA values from 2.5-4.0, particularly in younger men <42 years old, the AUA and NCCN suggest testing for % Free PSA (3515) and evaluation of the rate of increase in PSA (  PSA velocity).    Past Medical History  Diagnosis Date  . Hyperlipidemia   . Hypertension   . Allergy    Past Surgical History  Procedure Laterality Date  . Vasectomy  2006   Current Outpatient Prescriptions on File Prior to Visit  Medication Sig Dispense Refill  . aspirin 81 MG tablet Take 81 mg by mouth daily.    Marland Kitchen atorvastatin (LIPITOR) 80 MG tablet TAKE 1 TABLET (80 MG TOTAL) BY MOUTH AT BEDTIME. 90 tablet 1  . Glucosamine 500 MG CAPS Take 1 capsule by mouth daily.    Marland Kitchen lisinopril-hydrochlorothiazide (PRINZIDE,ZESTORETIC) 20-12.5 MG per tablet TAKE 2 TABLETS BY MOUTH DAILY 180 tablet 0  . loratadine (CLARITIN) 10 MG tablet Take 10 mg by mouth daily as needed for allergies.    . Lysine 500 MG CAPS Take 1 capsule by mouth daily.    . Multiple Vitamins-Minerals (MULTIVITAMIN PO) Take by mouth.     No current facility-administered medications on file prior to visit.   No Known Allergies History   Social History  . Marital Status: Married    Spouse Name: N/A    Number of Children: N/A  . Years of Education: N/A   Occupational History  . Not on file.   Social History Main Topics  . Smoking status: Never Smoker   . Smokeless tobacco: Never Used  . Alcohol Use: No  . Drug Use:  No  . Sexual Activity: Yes     Comment: married to [place].  Three kids.  exercising daily.   Other Topics Concern  . Not on file   Social History Narrative   Family History  Problem Relation Age of Onset  . Heart disease Mother   . Heart disease Father   . Stroke Father   . Hypertension Sister   . Colon cancer Neg Hx       Review of Systems  All other systems reviewed and are negative.      Objective:   Physical Exam  Constitutional: He is oriented to person, place, and time. He appears well-developed and well-nourished. No distress.  HENT:  Head: Normocephalic and atraumatic.  Right Ear: External ear normal.  Left Ear: External ear normal.  Nose: Nose normal.  Mouth/Throat: Oropharynx is clear and moist. No oropharyngeal exudate.  Eyes: Conjunctivae and EOM are normal. Pupils are equal, round, and reactive to light. Right eye exhibits no discharge. Left eye exhibits no discharge. No scleral icterus.  Neck: Normal range of motion. Neck supple. No JVD present. No tracheal deviation present. No thyromegaly present.  Cardiovascular: Normal rate, regular rhythm, normal heart sounds and intact distal pulses.  Exam reveals no gallop and no friction rub.   No murmur heard. Pulmonary/Chest: Effort normal and breath sounds normal. No stridor. No respiratory distress. He has no wheezes. He has no rales. He exhibits no tenderness.  Abdominal: Soft. Bowel sounds are normal. He exhibits no distension and no mass. There is no tenderness. There is no rebound and no guarding.  Genitourinary: Rectum normal and prostate normal.  Musculoskeletal: Normal range of motion. He exhibits no edema or tenderness.  Lymphadenopathy:    He has no cervical adenopathy.  Neurological: He is alert and oriented to person, place, and time. He has normal reflexes. He displays normal reflexes. No cranial nerve deficit. He exhibits normal muscle tone. Coordination normal.  Skin: Rash noted. He is not  diaphoretic. There is erythema.  Psychiatric: He has a normal mood and affect. His behavior is  normal. Judgment and thought content normal.  Vitals reviewed.         Assessment & Plan:  Routine general medical examination at a health care facility  Physical exam is significant for an elevated blood pressure, borderline prediabetes, and significant elevations in triglycerides. Patient missed that his diet has slipped substantially. He would like to try 6 months of intensive therapeutic lifestyle changes including a low saturated fat low carbohydrate diet and increasing aerobic exercise. We will recheck his blood work in 6 months prior to making any medication changes. Patient will check his blood pressure everyday for the next 2 weeks and report to me the values. If consistently greater than 140/90, I would increase the patient's lisinopril hydrochlorothiazide to 40/25. 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 There is one concerning for cancer) of his ear that requires a biopsy. There is also one behind his left ear that requires biopsy.  There is one lesion on the dorsum of each hand is concerning for an AK. I would recommend a biopsy of the lesion in front year a biopsy of the lesion behind the ear and cryotherapy for the lesions on the hands. The patient will return in 2 weeks for those treatments.

## 2014-05-22 ENCOUNTER — Ambulatory Visit: Payer: BLUE CROSS/BLUE SHIELD | Admitting: Family Medicine

## 2014-06-05 ENCOUNTER — Ambulatory Visit: Payer: BLUE CROSS/BLUE SHIELD | Admitting: Family Medicine

## 2014-06-19 ENCOUNTER — Ambulatory Visit (INDEPENDENT_AMBULATORY_CARE_PROVIDER_SITE_OTHER): Payer: BLUE CROSS/BLUE SHIELD | Admitting: Family Medicine

## 2014-06-19 ENCOUNTER — Encounter: Payer: Self-pay | Admitting: Family Medicine

## 2014-06-19 VITALS — BP 154/94 | HR 76 | Temp 98.2°F | Resp 18 | Wt 228.0 lb

## 2014-06-19 DIAGNOSIS — D485 Neoplasm of uncertain behavior of skin: Secondary | ICD-10-CM | POA: Diagnosis not present

## 2014-06-19 DIAGNOSIS — L570 Actinic keratosis: Secondary | ICD-10-CM

## 2014-06-19 NOTE — Progress Notes (Signed)
Subjective:    Patient ID: Joseph [name], male    DOB: Aug 07, 1960, 54 y.o.   MRN: [id number]  HPI Please see my last office visit. Patient has returned today for biopsies of the lesions we discussed that his exam. There is a 7 mm erythematous scaly papule just in front of his left ear. This lesion has persisted. He is here today for biopsy of that. There is a 6 mm erythematous scaly papule on the dorsum of his right hand. He is here for cryotherapy to treat. There are 3 other lesions on the dorsum of his left hand. Each are scaly white papules that are approximately 3-4 mm in size. I believe all the lesions on his hands are actinic keratoses or possibly squamous cell carcinoma in situ. I am very concerned about a lesion on his left temple. His blood pressure is also consistently elevated today. Past Medical History  Diagnosis Date  . Hyperlipidemia   . Hypertension   . Allergy    Past Surgical History  Procedure Laterality Date  . Vasectomy  2006   Current Outpatient Prescriptions on File Prior to Visit  Medication Sig Dispense Refill  . aspirin 81 MG tablet Take 81 mg by mouth daily.    Marland Kitchen atorvastatin (LIPITOR) 80 MG tablet TAKE 1 TABLET (80 MG TOTAL) BY MOUTH AT BEDTIME. 90 tablet 1  . fluticasone (FLONASE) 50 MCG/ACT nasal spray Place 2 sprays into both nostrils daily. 48 g 11  . Glucosamine 500 MG CAPS Take 1 capsule by mouth daily.    Marland Kitchen lisinopril-hydrochlorothiazide (PRINZIDE,ZESTORETIC) 20-12.5 MG per tablet TAKE 2 TABLETS BY MOUTH DAILY 180 tablet 0  . loratadine (CLARITIN) 10 MG tablet Take 10 mg by mouth daily as needed for allergies.    . Lysine 500 MG CAPS Take 1 capsule by mouth daily.    . Multiple Vitamins-Minerals (MULTIVITAMIN PO) Take by mouth.     No current facility-administered medications on file prior to visit.   No Known Allergies History   Social History  . Marital Status: Married    Spouse Name: N/A  . Number of Children: N/A  . Years of Education: N/A     Occupational History  . Not on file.   Social History Main Topics  . Smoking status: Never Smoker   . Smokeless tobacco: Never Used  . Alcohol Use: No  . Drug Use: No  . Sexual Activity: Yes     Comment: married to [place].  Three kids.  exercising daily.   Other Topics Concern  . Not on file   Social History Narrative      Review of Systems  All other systems reviewed and are negative.      Objective:   Physical Exam  Cardiovascular: Normal rate and regular rhythm.   Pulmonary/Chest: Effort normal and breath sounds normal.  Skin: Rash noted. There is erythema.  Vitals reviewed.         Assessment & Plan:  Neoplasm of uncertain behavior of skin - Plan: Pathology  AK (actinic keratosis)  The lesion just in front of his left ear was anesthetized with 0.1% lidocaine with epinephrine. Shave biopsy was performed of the lesion using sterile technique. It was sent to pathology and labeled container. Hemostasis was achieved with Drysol and Ollie Sporn. The lesion on the dorsum of his right hand and the 3 lesions on the dorsum of his left hand were each treated with cryotherapy using liquid nitrogen for a total of 30 seconds each.  Also recommended he check his blood pressure daily and if consistently greater than 140/90, I would add amlodipine 10 mg by mouth daily.

## 2014-06-23 LAB — PATHOLOGY

## 2014-06-25 ENCOUNTER — Other Ambulatory Visit: Payer: Self-pay | Admitting: Family Medicine

## 2014-06-25 NOTE — Telephone Encounter (Signed)
Refill appropriate and filled per protocol. 

## 2014-06-29 ENCOUNTER — Other Ambulatory Visit: Payer: Self-pay | Admitting: Family Medicine

## 2014-06-29 ENCOUNTER — Telehealth: Payer: Self-pay | Admitting: Family Medicine

## 2014-06-29 DIAGNOSIS — C4492 Squamous cell carcinoma of skin, unspecified: Secondary | ICD-10-CM

## 2014-06-29 NOTE — Telephone Encounter (Signed)
Dr Dennard Schaumann asked patient to call in bp results   Average for 1 week 155/110   [phone]

## 2014-06-30 MED ORDER — AMLODIPINE BESYLATE 10 MG PO TABS: 10.0000 mg | ORAL_TABLET | Freq: Every day | ORAL | Status: DC

## 2014-06-30 NOTE — Telephone Encounter (Signed)
Pt aware and med sent to pharm - he will call back in a few week to report his BP readings

## 2014-06-30 NOTE — Telephone Encounter (Signed)
LMTRC

## 2014-06-30 NOTE — Telephone Encounter (Signed)
Add amlodipine 10 mg poqday as BP is too high.

## 2014-07-04 ENCOUNTER — Other Ambulatory Visit: Payer: Self-pay | Admitting: Physician Assistant

## 2014-07-06 NOTE — Telephone Encounter (Signed)
Medication refilled per protocol. 

## 2014-09-14 ENCOUNTER — Other Ambulatory Visit: Payer: Self-pay | Admitting: Family Medicine

## 2014-09-14 MED ORDER — AMLODIPINE BESYLATE 10 MG PO TABS: 10.0000 mg | ORAL_TABLET | Freq: Every day | ORAL | Status: DC

## 2014-09-14 NOTE — Telephone Encounter (Signed)
Medication called/sent to requested pharmacy  

## 2014-12-25 ENCOUNTER — Other Ambulatory Visit: Payer: Self-pay | Admitting: Family Medicine

## 2015-01-06 ENCOUNTER — Other Ambulatory Visit: Payer: Self-pay | Admitting: Family Medicine

## 2015-06-25 ENCOUNTER — Other Ambulatory Visit: Payer: Self-pay | Admitting: Family Medicine

## 2015-06-25 ENCOUNTER — Encounter: Payer: Self-pay | Admitting: Family Medicine

## 2015-06-25 NOTE — Telephone Encounter (Signed)
Medication refill for one time only.  Patient needs to be seen.  Letter sent for patient to call and schedule 

## 2015-08-18 ENCOUNTER — Encounter: Payer: Self-pay | Admitting: Physician Assistant

## 2015-08-18 ENCOUNTER — Other Ambulatory Visit: Payer: Self-pay | Admitting: Physician Assistant

## 2015-08-18 ENCOUNTER — Ambulatory Visit (INDEPENDENT_AMBULATORY_CARE_PROVIDER_SITE_OTHER): Payer: BLUE CROSS/BLUE SHIELD | Admitting: Physician Assistant

## 2015-08-18 VITALS — BP 126/94 | HR 96 | Temp 98.0°F | Resp 18 | Wt 223.0 lb

## 2015-08-18 DIAGNOSIS — R81 Glycosuria: Secondary | ICD-10-CM | POA: Diagnosis not present

## 2015-08-18 DIAGNOSIS — R300 Dysuria: Secondary | ICD-10-CM | POA: Diagnosis not present

## 2015-08-18 LAB — URINALYSIS, MICROSCOPIC ONLY
Bacteria, UA: NONE SEEN [HPF]
CASTS: NONE SEEN [LPF]
CRYSTALS: NONE SEEN [HPF]
RBC / HPF: NONE SEEN RBC/HPF (ref <=2)
Yeast: NONE SEEN [HPF]

## 2015-08-18 LAB — URINALYSIS, ROUTINE W REFLEX MICROSCOPIC
Bilirubin Urine: NEGATIVE
Hgb urine dipstick: NEGATIVE
LEUKOCYTES UA: NEGATIVE
Nitrite: NEGATIVE
PH: 6.5 (ref 5.0–8.0)
Specific Gravity, Urine: 1.015 (ref 1.001–1.035)

## 2015-08-18 LAB — HEMOGLOBIN A1C, FINGERSTICK: Hgb A1C (fingerstick): 11.7 % — ABNORMAL HIGH (ref <5.7)

## 2015-08-18 MED ORDER — CIPROFLOXACIN HCL 500 MG PO TABS: ORAL_TABLET | ORAL | Status: DC

## 2015-08-18 MED ORDER — AZITHROMYCIN 500 MG PO TABS: ORAL_TABLET | ORAL | Status: DC

## 2015-08-18 MED ORDER — CIPROFLOXACIN HCL 500 MG PO TABS: 500.0000 mg | ORAL_TABLET | Freq: Two times a day (BID) | ORAL | Status: DC

## 2015-08-18 NOTE — Progress Notes (Signed)
Patient ID: Joseph [name] MRN: [id number], DOB: 07-19-1960, 55 y.o. Date of Encounter: @DATE @  Chief Complaint:  Chief Complaint  Patient presents with  . urgency,burning, pain with urination    HPI: 55 y.o. year old white male  presents with above complaints.   He states that he started noticing these symptoms about 2 weeks ago. Says that he started drinking cranberry juice and then bought some AZO and has been using this. Says that he feels burning every time he urinates. Also is having urinary frequency. When asked if he had seen any discharge, he says that he has seen only a little small amount of white discharge 2 times. States that there is no coating or covering over the penis at all but just saw small amount of discharge coming out a couple times. Has seen no blood. However says that he also feels burning at the end of his penis even right now--- sitting in exam room after leaving urine sample. Reports that he has felt no pelvic pain----has had none of the discomfort that usually occurs with prostatitis. Has had no known fevers. Says he has a physical scheduled for next week.   Past Medical History  Diagnosis Date  . Hyperlipidemia   . Hypertension   . Allergy      Home Meds: Outpatient Prescriptions Prior to Visit  Medication Sig Dispense Refill  . amLODipine (NORVASC) 10 MG tablet Take 1 tablet (10 mg total) by mouth daily. 90 tablet 3  . aspirin 81 MG tablet Take 81 mg by mouth daily.    Marland Kitchen atorvastatin (LIPITOR) 80 MG tablet TAKE 1 TABLET BY MOUTH AT BEDTIME 90 tablet 0  . fluticasone (FLONASE) 50 MCG/ACT nasal spray Place 2 sprays into both nostrils daily. 48 g 11  . Glucosamine 500 MG CAPS Take 1 capsule by mouth daily.    Marland Kitchen lisinopril-hydrochlorothiazide (PRINZIDE,ZESTORETIC) 20-12.5 MG per tablet TAKE 2 TABLETS BY MOUTH DAILY 180 tablet 3  . loratadine (CLARITIN) 10 MG tablet Take 10 mg by mouth daily as needed for allergies.    . Lysine 500 MG CAPS Take 1  capsule by mouth daily.    . Multiple Vitamins-Minerals (MULTIVITAMIN PO) Take by mouth.     No facility-administered medications prior to visit.    Allergies: No Known Allergies  Social History   Social History  . Marital Status: Married    Spouse Name: N/A  . Number of Children: N/A  . Years of Education: N/A   Occupational History  . Not on file.   Social History Main Topics  . Smoking status: Never Smoker   . Smokeless tobacco: Never Used  . Alcohol Use: No  . Drug Use: No  . Sexual Activity: Yes     Comment: married to [place].  Three kids.  exercising daily.   Other Topics Concern  . Not on file   Social History Narrative    Family History  Problem Relation Age of Onset  . Heart disease Mother   . Heart disease Father   . Stroke Father   . Hypertension Sister   . Colon cancer Neg Hx      Review of Systems:  See HPI for pertinent ROS. All other ROS negative.    Physical Exam: Blood pressure 126/94, pulse 96, temperature 98 F (36.7 C), temperature source Oral, resp. rate 18, weight 223 lb (101.152 kg)., Body mass index is 33.91 kg/(m^2). General: Overweight WM. Appears in no acute distress. Neck: Supple. No thyromegaly. No  lymphadenopathy. Lungs: Clear bilaterally to auscultation without wheezes, rales, or rhonchi. Breathing is unlabored. Heart: RRR with S1 S2. No murmurs, rubs, or gallops. Abdomen: Soft, non-tender, non-distended with normoactive bowel sounds. No hepatomegaly. No rebound/guarding. No obvious abdominal masses. No tenderness with palpation even in the pelvic region. Musculoskeletal:  Strength and tone normal for age. No tenderness with percussion of costophrenic angles bilaterally. Extremities/Skin: Warm and dry. Neuro: Alert and oriented X 3. Moves all extremities spontaneously. Gait is normal. CNII-XII grossly in tact. Psych:  Responds to questions appropriately with a normal affect.     ASSESSMENT AND PLAN:  55 y.o. year old male  with  1. Burning with urination  Urinalysis does not show any signs of UTI/prostatitis. Therefore I was going to treat empirically to cover STDs gonorrhea chlamydia. However when I went back in the room and reviewed this he says that there is no way he can have an STD unless he can pick it up in a bathroom. Therefore told him not to pick up those antibiotics. Instead have him start Cipro to cover for possible UTI while I wait for lab results. Sent prescription for Cipro 500 mg twice a day 14 days but will follow-up with lab results in the interim. - Urinalysis, Routine w reflex microscopic (not at [facility]) - Urine culture - GC/chlamydia probe amp, urine - azithromycin (ZITHROMAX) 500 MG tablet; 4 pills x one dose  Dispense: 4 tablet; Refill: 0 - ciprofloxacin (CIPRO) 500 MG tablet; Take one tablet for one dose.  Dispense: 1 tablet; Refill: 0  2. Glucosuria - Hemoglobin A1C, fingerstick   Signed, [address] [place], [place], [facility] 08/18/2015 1:52 PM

## 2015-08-19 ENCOUNTER — Other Ambulatory Visit: Payer: Self-pay | Admitting: Physician Assistant

## 2015-08-19 ENCOUNTER — Ambulatory Visit (INDEPENDENT_AMBULATORY_CARE_PROVIDER_SITE_OTHER): Payer: BLUE CROSS/BLUE SHIELD | Admitting: Physician Assistant

## 2015-08-19 ENCOUNTER — Encounter: Payer: Self-pay | Admitting: Physician Assistant

## 2015-08-19 ENCOUNTER — Telehealth: Payer: Self-pay | Admitting: Family Medicine

## 2015-08-19 VITALS — BP 122/78 | HR 76 | Temp 98.2°F | Resp 18 | Wt 223.0 lb

## 2015-08-19 DIAGNOSIS — E1165 Type 2 diabetes mellitus with hyperglycemia: Secondary | ICD-10-CM | POA: Diagnosis not present

## 2015-08-19 DIAGNOSIS — E119 Type 2 diabetes mellitus without complications: Secondary | ICD-10-CM | POA: Insufficient documentation

## 2015-08-19 DIAGNOSIS — R7989 Other specified abnormal findings of blood chemistry: Secondary | ICD-10-CM | POA: Diagnosis not present

## 2015-08-19 LAB — GC/CHLAMYDIA PROBE AMP
CT Probe RNA: NOT DETECTED
GC Probe RNA: NOT DETECTED

## 2015-08-19 LAB — COMPLETE METABOLIC PANEL WITH GFR
ALBUMIN: 4.4 g/dL (ref 3.6–5.1)
ALK PHOS: 76 U/L (ref 40–115)
ALT: 70 U/L — AB (ref 9–46)
AST: 45 U/L — AB (ref 10–35)
BUN: 21 mg/dL (ref 7–25)
CO2: 24 mmol/L (ref 20–31)
Calcium: 9.8 mg/dL (ref 8.6–10.3)
Chloride: 99 mmol/L (ref 98–110)
Creat: 1.14 mg/dL (ref 0.70–1.33)
GFR, Est African American: 85 mL/min (ref >=60)
GFR, Est Non African American: 74 mL/min (ref >=60)
GLUCOSE: 266 mg/dL — AB (ref 70–99)
POTASSIUM: 3.7 mmol/L (ref 3.5–5.3)
SODIUM: 137 mmol/L (ref 135–146)
TOTAL PROTEIN: 7.2 g/dL (ref 6.1–8.1)
Total Bilirubin: 0.7 mg/dL (ref 0.2–1.2)

## 2015-08-19 LAB — URINE CULTURE: Colony Count: 3000

## 2015-08-19 MED ORDER — GLUCOSE BLOOD VI STRP: 1.0000 | ORAL_STRIP | Status: DC

## 2015-08-19 MED ORDER — INSULIN PEN NEEDLE 30G X 5 MM MISC: 1.0000 | Freq: Every day | Status: DC

## 2015-08-19 MED ORDER — BLOOD GLUCOSE MONITOR KIT: PACK | Status: AC

## 2015-08-19 MED ORDER — METFORMIN HCL 500 MG PO TABS: 500.0000 mg | ORAL_TABLET | Freq: Two times a day (BID) | ORAL | Status: DC

## 2015-08-19 MED ORDER — FLUCONAZOLE 150 MG PO TABS: 150.0000 mg | ORAL_TABLET | Freq: Once | ORAL | Status: DC

## 2015-08-19 MED ORDER — LANCETS 30G MISC: 1.0000 | Freq: Every day | Status: DC

## 2015-08-19 MED ORDER — INSULIN GLARGINE 100 UNIT/ML ~~LOC~~ SOLN: SUBCUTANEOUS | Status: DC

## 2015-08-19 MED ORDER — INSULIN DETEMIR 100 UNIT/ML FLEXPEN: 10.0000 [IU] | PEN_INJECTOR | Freq: Every day | SUBCUTANEOUS | Status: DC

## 2015-08-19 NOTE — Telephone Encounter (Signed)
New RX sent.  Pt made aware of formulary changes.

## 2015-08-19 NOTE — Telephone Encounter (Signed)
Levemir.  He is to dose the same as what instructed at [facility] (for Lantus)

## 2015-08-19 NOTE — Progress Notes (Signed)
Patient ID: Joseph [name] MRN: [id number], DOB: 05-23-1960, 55 y.o. Date of Encounter: @DATE @  Chief Complaint:  Chief Complaint  Patient presents with  . review lab results    new diabetic    HPI: 55 y.o. year old white male  presents for f/u of yesterday's OV and eval that revealed newly diagnosed diabetes.  Yesterday he had office visit secondary to complaints of dysuria and urinary frequency. Urinalysis revealed positive glucose. Hemoglobin A1c was checked and was elevated at 11.7.  He has no prior diagnosis of diabetes. At labs 04/28/14 --glucose was 118.  He tells me that for over a year now, his diet has been horrible. Says that before then-- he was following a Mediterranean diet and ate very healthy and was very strict with this diet. Says that since then, he has eaten horrible-- has eaten burgers and all kinds of food. Says that he was on vacation last week. The kids were drinking [facility] so he started drinking [facility] and has been drinking orange juice and even drinking Kool-Aid that the kids were drinking. Says that he does continue to jog some-- is currently doing 3 days a week-- about 2 miles. However, says that in the past, he was jogging every day.  Says that his mom does have to check her blood sugar and monitor it, but is on no medicines for diabetes. Says that his father has passed away but he did not have diabetes.     Past Medical History  Diagnosis Date  . Hyperlipidemia   . Hypertension   . Allergy      Home Meds: Outpatient Prescriptions Prior to Visit  Medication Sig Dispense Refill  . amLODipine (NORVASC) 10 MG tablet Take 1 tablet (10 mg total) by mouth daily. 90 tablet 3  . aspirin 81 MG tablet Take 81 mg by mouth daily.    Marland Kitchen atorvastatin (LIPITOR) 80 MG tablet TAKE 1 TABLET BY MOUTH AT BEDTIME 90 tablet 0  . azithromycin (ZITHROMAX) 500 MG tablet 4 pills x one dose 4 tablet 0  . fluticasone (FLONASE) 50 MCG/ACT nasal spray Place 2  sprays into both nostrils daily. 48 g 11  . Glucosamine 500 MG CAPS Take 1 capsule by mouth daily.    Marland Kitchen lisinopril-hydrochlorothiazide (PRINZIDE,ZESTORETIC) 20-12.5 MG per tablet TAKE 2 TABLETS BY MOUTH DAILY 180 tablet 3  . loratadine (CLARITIN) 10 MG tablet Take 10 mg by mouth daily as needed for allergies.    . Lysine 500 MG CAPS Take 1 capsule by mouth daily.    . Multiple Vitamins-Minerals (MULTIVITAMIN PO) Take by mouth.    . ciprofloxacin (CIPRO) 500 MG tablet Take one tablet for one dose. 1 tablet 0  . ciprofloxacin (CIPRO) 500 MG tablet Take 1 tablet (500 mg total) by mouth 2 (two) times daily. 28 tablet 0   No facility-administered medications prior to visit.    Allergies: No Known Allergies  Social History   Social History  . Marital Status: Married    Spouse Name: N/A  . Number of Children: N/A  . Years of Education: N/A   Occupational History  . Not on file.   Social History Main Topics  . Smoking status: Never Smoker   . Smokeless tobacco: Never Used  . Alcohol Use: No  . Drug Use: No  . Sexual Activity: Yes     Comment: married to [place].  Three kids.  exercising daily.   Other Topics Concern  . Not on file  Social History Narrative    Family History  Problem Relation Age of Onset  . Heart disease Mother   . Heart disease Father   . Stroke Father   . Hypertension Sister   . Colon cancer Neg Hx      Review of Systems:  See HPI for pertinent ROS. All other ROS negative.    Physical Exam: Blood pressure 122/78, pulse 76, temperature 98.2 F (36.8 C), temperature source Oral, resp. rate 18, weight 223 lb (101.152 kg)., Body mass index is 33.91 kg/(m^2). General: WNWD WM. Appears in no acute distress. Neck: Supple. No thyromegaly. No lymphadenopathy. Lungs: Clear bilaterally to auscultation without wheezes, rales, or rhonchi. Breathing is unlabored. Heart: RRR with S1 S2. No murmurs, rubs, or gallops. Abdomen: Soft, non-tender, non-distended with  normoactive bowel sounds. No hepatomegaly. No rebound/guarding. No obvious abdominal masses. Musculoskeletal:  Strength and tone normal for age. Extremities/Skin: Warm and dry. No edema.  Neuro: Alert and oriented X 3. Moves all extremities spontaneously. Gait is normal. CNII-XII grossly in tact. Psych:  Responds to questions appropriately with a normal affect.     ASSESSMENT AND PLAN:  55 y.o. year old male with  1. Type 2 diabetes mellitus with hyperglycemia, unspecified long term insulin use status (HCC) - COMPLETE METABOLIC PANEL WITH GFR - metFORMIN (GLUCOPHAGE) 500 MG tablet; Take 1 tablet (500 mg total) by mouth 2 (two) times daily with a meal.  Dispense: 60 tablet; Refill: 0 - insulin glargine (LANTUS) 100 UNIT/ML injection; Start with 10 units each night. Increase by 2 units each night, as directed.  Dispense: 10 mL; Refill: 11 - fluconazole (DIFLUCAN) 150 MG tablet; Take 1 tablet (150 mg total) by mouth once.  Dispense: 1 tablet; Refill: 0  Will prescribe Diflucan x 1 as I suspect that given his history provided yesterday, and hyperglycemia, that he probably has some yeast contributing to his dysuria.   He is to start metformin 500 mg 1 twice a day. At his follow-up visit, if he is tolerating this well with no GI side effects, will increase dose to 1000 mg twice a day.  Today I have given and reviewed carbohydrate handout. He is extremely motivated to make diet changes and says that he does better to follow a strict diet and will probably start following his Mediterranean diet again.  Today we have given him samples of Toujeo.  He is to start with 10 units each night. He is to check blood sugar every morning. He is to increase the Toujeo by 2 units each night until he gets fasting sugars down towards 150.  He is to schedule follow-up office visit with me for one week.  He states that he is still on his statin and aspirin.  We will further discuss diabetes at follow-up  visits. Will discuss long-term effects of diabetes and effects on eyes kidneys feet and heart. In the future will need to do diabetic foot exam, discuss ACE inhibitor and recheck lipid panel.  He states that he had a complete physical exam scheduled with Dr. Dennard Schaumann for April 28. He is going to move that out a month so that we can get this blood sugar controlled with these follow-up visits prior to him coming back in for the physical.  He is to schedule follow-up office visit with me for one week. He is to call/follow-up if he is having any questions or concerns in the interim.  Signed, [address] [place], [place], [facility] 08/19/2015 10:04 AM

## 2015-08-19 NOTE — Telephone Encounter (Signed)
Insurance covers Engineer, agricultural, Scientist, clinical (histocompatibility and immunogenetics).  Need to switch to one of those.

## 2015-08-21 LAB — HEPATITIS PANEL, ACUTE
HCV AB: NEGATIVE
HEP A IGM: NONREACTIVE
HEP B C IGM: NONREACTIVE
Hepatitis B Surface Ag: NEGATIVE

## 2015-08-23 ENCOUNTER — Other Ambulatory Visit: Payer: Self-pay | Admitting: Family Medicine

## 2015-08-23 ENCOUNTER — Telehealth: Payer: Self-pay | Admitting: Family Medicine

## 2015-08-23 DIAGNOSIS — R945 Abnormal results of liver function studies: Principal | ICD-10-CM

## 2015-08-23 DIAGNOSIS — E1165 Type 2 diabetes mellitus with hyperglycemia: Secondary | ICD-10-CM

## 2015-08-23 DIAGNOSIS — R7989 Other specified abnormal findings of blood chemistry: Secondary | ICD-10-CM

## 2015-08-23 NOTE — Telephone Encounter (Signed)
Diflucan has helped some but still with some irritation.  Can he have 1 more??

## 2015-08-23 NOTE — Telephone Encounter (Signed)
Yes

## 2015-08-24 ENCOUNTER — Other Ambulatory Visit: Payer: BLUE CROSS/BLUE SHIELD

## 2015-08-24 MED ORDER — FLUCONAZOLE 150 MG PO TABS: 150.0000 mg | ORAL_TABLET | Freq: Once | ORAL | Status: DC

## 2015-08-24 NOTE — Telephone Encounter (Signed)
Left pt message rx sent.

## 2015-08-24 NOTE — Telephone Encounter (Signed)
rx to pharmacy 

## 2015-08-26 ENCOUNTER — Ambulatory Visit (INDEPENDENT_AMBULATORY_CARE_PROVIDER_SITE_OTHER): Payer: BLUE CROSS/BLUE SHIELD | Admitting: Physician Assistant

## 2015-08-26 ENCOUNTER — Encounter: Payer: Self-pay | Admitting: Physician Assistant

## 2015-08-26 VITALS — BP 126/78 | HR 68 | Temp 97.8°F | Resp 18 | Wt 223.0 lb

## 2015-08-26 DIAGNOSIS — R945 Abnormal results of liver function studies: Secondary | ICD-10-CM

## 2015-08-26 DIAGNOSIS — E1165 Type 2 diabetes mellitus with hyperglycemia: Secondary | ICD-10-CM | POA: Diagnosis not present

## 2015-08-26 DIAGNOSIS — I10 Essential (primary) hypertension: Secondary | ICD-10-CM | POA: Diagnosis not present

## 2015-08-26 DIAGNOSIS — E785 Hyperlipidemia, unspecified: Secondary | ICD-10-CM | POA: Diagnosis not present

## 2015-08-26 DIAGNOSIS — R7989 Other specified abnormal findings of blood chemistry: Secondary | ICD-10-CM

## 2015-08-26 MED ORDER — METFORMIN HCL 1000 MG PO TABS: 1000.0000 mg | ORAL_TABLET | Freq: Two times a day (BID) | ORAL | Status: DC

## 2015-08-26 NOTE — Progress Notes (Signed)
Patient ID: Joseph [name] MRN: [id number], DOB: 1960/08/24, 55 y.o. Date of Encounter: _0 @  Chief Complaint:  Chief Complaint  Patient presents with  . new diabetic    1 week follow up    HPI: 55 y.o. year old white male  Presents for f/u of Newly Diagnosed Diabetes.  08/19/2015: presents for f/u of yesterday's OV and eval that revealed newly diagnosed diabetes.  Yesterday he had office visit secondary to complaints of dysuria and urinary frequency. Urinalysis revealed positive glucose. Hemoglobin A1c was checked and was elevated at 11.7.  He has no prior diagnosis of diabetes. At labs 04/28/14 --glucose was 118.  He tells me that for over a year now, his diet has been horrible. Says that before then-- he was following a Mediterranean diet and ate very healthy and was very strict with this diet. Says that since then, he has eaten horrible-- has eaten burgers and all kinds of food. Says that he was on vacation last week. The kids were drinking [facility] so he started drinking [facility] and has been drinking orange juice and even drinking Kool-Aid that the kids were drinking. Says that he does continue to jog some-- is currently doing 3 days a week-- about 2 miles. However, says that in the past, he was jogging every day.  Says that his mom does have to check her blood sugar and monitor it, but is on no medicines for diabetes. Says that his father has passed away but he did not have diabetes.  AT THAT OV: Started Metformin 542m 1 po BID Started Lantus at 10 units QHS and to titrate up 2 units each night until fasting BS around 120-150 then pause at that amount.   08/26/2015: He is taking the metformin 500 mg twice a day as directed. Having no significant GI adverse effects or other adverse effects. He has been taking the insulin as directed.(Secondary to insurance formulary, Lantus was changed to Levemir).  He brought in his log sheet where he has been checking blood  sugar every morning. He has been titrating up to 2 units each night.  April 21--- Gave 12 units--Fasting BS --278 April 22-- Gave 14 units----Fasting BS---230 April 23-- Gave 16 units---Fasting BS---241 April 24--- Gave 18 units---Fasting BS---236 April 25--Gave 20 units---Fasting BS---195 April 26---Gave 20 units--Fasting BS---217 April 27--- Fasting BS---165  Says that a couple of those nights he did not eat dinner until 9 PM secondary to kids ballgames. Noticed that on the nights that he ate dinner later his fasting morning blood sugars were higher. When he eats dinner earlier, the fasting sugars are lower. Also said he wasn't sure if he was supposed to keep going higher on the insulin so he paused at 20 units these past couple of nights.  Has his kids every other week. 3 kids. Says that he was eating tons of fast food over the past year----when he has them, they would be going between ballgames and just pick up fast food.   Past Medical History  Diagnosis Date  . Hyperlipidemia   . Hypertension   . Allergy      Home Meds: Outpatient Prescriptions Prior to Visit  Medication Sig Dispense Refill  . amLODipine (NORVASC) 10 MG tablet Take 1 tablet (10 mg total) by mouth daily. 90 tablet 3  . aspirin 81 MG tablet Take 81 mg by mouth daily.    .Marland Kitchenatorvastatin (LIPITOR) 80 MG tablet TAKE 1 TABLET BY MOUTH AT BEDTIME 90 tablet  0  . azithromycin (ZITHROMAX) 500 MG tablet 4 pills x one dose 4 tablet 0  . blood glucose meter kit and supplies KIT Dispense based on patient and insurance preference. Check fasting blood sugar each morning.  E11.65 1 each 0  . fluconazole (DIFLUCAN) 150 MG tablet Take 1 tablet (150 mg total) by mouth once. 1 tablet 0  . fluticasone (FLONASE) 50 MCG/ACT nasal spray Place 2 sprays into both nostrils daily. 48 g 11  . Glucosamine 500 MG CAPS Take 1 capsule by mouth daily.    . glucose blood test strip 1 each by Other route every morning. check fasting blood sugar  each morning before breakfast 100 each 3  . Lancets 30G MISC 1 each by Does not apply route daily. 100 each 3  . lisinopril-hydrochlorothiazide (PRINZIDE,ZESTORETIC) 20-12.5 MG per tablet TAKE 2 TABLETS BY MOUTH DAILY 180 tablet 3  . loratadine (CLARITIN) 10 MG tablet Take 10 mg by mouth daily as needed for allergies.    . Lysine 500 MG CAPS Take 1 capsule by mouth daily.    . Multiple Vitamins-Minerals (MULTIVITAMIN PO) Take by mouth.    . Insulin Pen Needle 30G X 5 MM MISC 1 each by Does not apply route daily. 100 each 3  . metFORMIN (GLUCOPHAGE) 500 MG tablet Take 1 tablet (500 mg total) by mouth 2 (two) times daily with a meal. 60 tablet 0  . Insulin Detemir (LEVEMIR) 100 UNIT/ML Pen Inject 10 Units into the skin daily at 10 pm. Increase by 2 units each night as instructed (Patient not taking: Reported on 08/26/2015) 15 mL 11   No facility-administered medications prior to visit.    Allergies: No Known Allergies  Social History   Social History  . Marital Status: Married    Spouse Name: N/A  . Number of Children: N/A  . Years of Education: N/A   Occupational History  . Not on file.   Social History Main Topics  . Smoking status: Never Smoker   . Smokeless tobacco: Never Used  . Alcohol Use: No  . Drug Use: No  . Sexual Activity: Yes     Comment: married to [place].  Three kids.  exercising daily.   Other Topics Concern  . Not on file   Social History Narrative    Family History  Problem Relation Age of Onset  . Heart disease Mother   . Heart disease Father   . Stroke Father   . Hypertension Sister   . Colon cancer Neg Hx      Review of Systems:  See HPI for pertinent ROS. All other ROS negative.    Physical Exam: Blood pressure 126/78, pulse 68, temperature 97.8 F (36.6 C), temperature source Oral, resp. rate 18, weight 223 lb (101.152 kg)., Body mass index is 33.91 kg/(m^2). General: WNWD WM. Appears in no acute distress. Neck: Supple. No thyromegaly. No  lymphadenopathy. Lungs: Clear bilaterally to auscultation without wheezes, rales, or rhonchi. Breathing is unlabored. Heart: RRR with S1 S2. No murmurs, rubs, or gallops. Abdomen: Soft, non-tender, non-distended with normoactive bowel sounds. No hepatomegaly. No rebound/guarding. No obvious abdominal masses. Musculoskeletal:  Strength and tone normal for age. Extremities/Skin: Warm and dry. No edema.  Neuro: Alert and oriented X 3. Moves all extremities spontaneously. Gait is normal. CNII-XII grossly in tact. Psych:  Responds to questions appropriately with a normal affect.     ASSESSMENT AND PLAN:  54 y.o. year old male with  1. Type 2 diabetes mellitus with   hyperglycemia, unspecified long term insulin use status (HCC)  Today I am increasing metformin to 1000 mg twice a day. If this causes GI adverse effects, he is to call us. He is to keep the insulin at the present dose of 20 units for several days while he is increasing the dose of his metformin. He will continue to check fasting morning BS. He will then adjust his Insulin dose accordingly. Goal--to keep Fasting AM BS around 120 .  He is going to continue low carbohydrate diet.  Discussed that he will eventually be able to/ need to decrease his insulin dose as his diet continues to improve. He voices understanding about adjusting his insulin dose.   Discussed long-term effects diabetes can have, especially if BS uncontrolled----including eyes, kidneys, feet, heart.  Disussed need for annual Diabetic Eye Exam. Discussed proper foot care.   Discussed plan for f/u.  He rescheduled his CPE with Dr. Pickard to end of May.   He will f/u at that time or call/ return for OV sooner if any questions or concerns.   30 minutes spent in face to face time with patient explaining, reviewing above information.    2. Elevated LFTs At recent lab, AST and ALT were slightly elevated. Hepatitis panel was added and was negative. Liver ultrasound was  then ordered. Patient states that he has this scheduled for next week. Suspect that these elevated LFTs were secondary to his extremely poor diet over the past year causing "fatty liver " Do not think that they were secondary to his Lipitor as only the AST and ALT were affected and were minimally increased. Will follow this up once we get the ultrasound report.  Signed, Mary Beth Dixon, PA, BSFM 08/26/2015 9:44 AM    

## 2015-08-27 ENCOUNTER — Encounter: Payer: BLUE CROSS/BLUE SHIELD | Admitting: Family Medicine

## 2015-09-01 ENCOUNTER — Other Ambulatory Visit: Payer: Self-pay

## 2015-09-07 ENCOUNTER — Ambulatory Visit
Admission: RE | Admit: 2015-09-07 | Discharge: 2015-09-07 | Disposition: A | Discharge status: Home / Self Care | Payer: BLUE CROSS/BLUE SHIELD | Source: Ambulatory Visit | Attending: Physician Assistant | Admitting: Physician Assistant

## 2015-09-07 DIAGNOSIS — R945 Abnormal results of liver function studies: Principal | ICD-10-CM

## 2015-09-07 DIAGNOSIS — R7989 Other specified abnormal findings of blood chemistry: Secondary | ICD-10-CM

## 2015-09-21 ENCOUNTER — Other Ambulatory Visit: Payer: BLUE CROSS/BLUE SHIELD

## 2015-09-21 ENCOUNTER — Other Ambulatory Visit: Payer: Self-pay | Admitting: Family Medicine

## 2015-09-21 DIAGNOSIS — Z0000 Encounter for general adult medical examination without abnormal findings: Secondary | ICD-10-CM

## 2015-09-21 LAB — CBC WITH DIFFERENTIAL/PLATELET
Basophils Absolute: 85 cells/uL (ref 0–200)
Basophils Relative: 1 %
EOS ABS: 170 {cells}/uL (ref 15–500)
Eosinophils Relative: 2 %
HEMATOCRIT: 44.4 % (ref 38.5–50.0)
HEMOGLOBIN: 15 g/dL (ref 13.0–17.0)
LYMPHS ABS: 2805 {cells}/uL (ref 850–3900)
Lymphocytes Relative: 33 %
MCH: 29 pg (ref 27.0–33.0)
MCHC: 33.8 g/dL (ref 32.0–36.0)
MCV: 85.7 fL (ref 80.0–100.0)
MONO ABS: 680 {cells}/uL (ref 200–950)
MPV: 10.9 fL (ref 7.5–12.5)
Monocytes Relative: 8 %
NEUTROS PCT: 56 %
Neutro Abs: 4760 cells/uL (ref 1500–7800)
Platelets: 297 10*3/uL (ref 140–400)
RBC: 5.18 MIL/uL (ref 4.20–5.80)
RDW: 14.6 % (ref 11.0–15.0)
WBC: 8.5 10*3/uL (ref 3.8–10.8)

## 2015-09-21 LAB — COMPLETE METABOLIC PANEL WITH GFR
ALBUMIN: 4.2 g/dL (ref 3.6–5.1)
ALK PHOS: 62 U/L (ref 40–115)
ALT: 56 U/L — AB (ref 9–46)
AST: 30 U/L (ref 10–35)
BUN: 15 mg/dL (ref 7–25)
CALCIUM: 9.4 mg/dL (ref 8.6–10.3)
CO2: 25 mmol/L (ref 20–31)
CREATININE: 1.01 mg/dL (ref 0.70–1.33)
Chloride: 102 mmol/L (ref 98–110)
GFR, Est African American: >89 mL/min (ref >=60)
GFR, Est Non African American: 85 mL/min (ref >=60)
Glucose, Bld: 121 mg/dL — ABNORMAL HIGH (ref 70–99)
POTASSIUM: 3.3 mmol/L — AB (ref 3.5–5.3)
Sodium: 139 mmol/L (ref 135–146)
Total Bilirubin: 0.5 mg/dL (ref 0.2–1.2)
Total Protein: 6.5 g/dL (ref 6.1–8.1)

## 2015-09-21 LAB — LIPID PANEL
CHOLESTEROL: 134 mg/dL (ref 125–200)
HDL: 34 mg/dL — ABNORMAL LOW (ref >=40)
LDL Cholesterol: 71 mg/dL (ref <130)
Total CHOL/HDL Ratio: 3.9 Ratio (ref <=5.0)
Triglycerides: 143 mg/dL (ref <150)
VLDL: 29 mg/dL (ref <30)

## 2015-09-22 LAB — PSA: PSA: 1.17 ng/mL (ref <=4.00)

## 2015-09-23 ENCOUNTER — Other Ambulatory Visit: Payer: Self-pay | Admitting: Family Medicine

## 2015-09-24 ENCOUNTER — Encounter: Payer: Self-pay | Admitting: Family Medicine

## 2015-09-24 ENCOUNTER — Ambulatory Visit (INDEPENDENT_AMBULATORY_CARE_PROVIDER_SITE_OTHER): Payer: BLUE CROSS/BLUE SHIELD | Admitting: Family Medicine

## 2015-09-24 VITALS — BP 160/92 | HR 80 | Temp 97.6°F | Resp 18 | Ht 68.0 in | Wt 225.0 lb

## 2015-09-24 DIAGNOSIS — Z0000 Encounter for general adult medical examination without abnormal findings: Secondary | ICD-10-CM

## 2015-09-24 DIAGNOSIS — E1165 Type 2 diabetes mellitus with hyperglycemia: Secondary | ICD-10-CM | POA: Diagnosis not present

## 2015-09-24 DIAGNOSIS — E785 Hyperlipidemia, unspecified: Secondary | ICD-10-CM

## 2015-09-24 DIAGNOSIS — I10 Essential (primary) hypertension: Secondary | ICD-10-CM | POA: Diagnosis not present

## 2015-09-24 MED ORDER — AMLODIPINE BESYLATE 10 MG PO TABS: 10.0000 mg | ORAL_TABLET | Freq: Every day | ORAL | Status: DC

## 2015-09-24 NOTE — Progress Notes (Signed)
Subjective:    Patient ID: Joseph [name], male    DOB: [birth year], 55 y.o.   MRN: [id number]  HPI Was seen 1 month ago. At that time was found to have a hemoglobin A1c of 11.7. Was started on metformin 1000 mg twice a day and was placed on Levemir. He is currently on 22 units of Levemir daily. Fasting blood sugars are averaging between 130 and 148. He is having no episodes of hypoglycemia. His blood pressure today is extremely high but he is adamant that it is good at home and that this is a fluke because he is nervous being at the doctor's office. We spent over 20 minutes discussing diabetes and diabetic education and checking his blood sugars and going over options for treatment in addition to his physical. Past Medical History  Diagnosis Date  . Hyperlipidemia   . Hypertension   . Allergy    Past Surgical History  Procedure Laterality Date  . Vasectomy  2006   Current Outpatient Prescriptions on File Prior to Visit  Medication Sig Dispense Refill  . aspirin 81 MG tablet Take 81 mg by mouth daily.    Marland Kitchen atorvastatin (LIPITOR) 80 MG tablet TAKE 1 TABLET BY MOUTH AT BEDTIME 90 tablet 1  . B-D UF III MINI PEN NEEDLES 31G X 5 MM MISC daily. as directed  3  . blood glucose meter kit and supplies KIT Dispense based on patient and insurance preference. Check fasting blood sugar each morning.  E11.65 1 each 0  . fluticasone (FLONASE) 50 MCG/ACT nasal spray Place 2 sprays into both nostrils daily. 48 g 11  . Glucosamine 500 MG CAPS Take 1 capsule by mouth daily.    Marland Kitchen glucose blood test strip 1 each by Other route every morning. check fasting blood sugar each morning before breakfast 100 each 3  . Insulin Detemir (LEVEMIR) 100 UNIT/ML Pen Inject 10 Units into the skin daily at 10 pm. Increase by 2 units each night as instructed (Patient taking differently: Inject 22 Units into the skin daily at 10 pm. Increase by 2 units each night as instructed) 15 mL 11  . Lancets 30G MISC 1 each by Does not  apply route daily. 100 each 3  . lisinopril-hydrochlorothiazide (PRINZIDE,ZESTORETIC) 20-12.5 MG per tablet TAKE 2 TABLETS BY MOUTH DAILY 180 tablet 3  . loratadine (CLARITIN) 10 MG tablet Take 10 mg by mouth daily as needed for allergies.    . Lysine 500 MG CAPS Take 1 capsule by mouth daily.    . metFORMIN (GLUCOPHAGE) 1000 MG tablet Take 1 tablet (1,000 mg total) by mouth 2 (two) times daily with a meal. 60 tablet 3  . Multiple Vitamins-Minerals (MULTIVITAMIN PO) Take by mouth.     No current facility-administered medications on file prior to visit.   No Known Allergies Social History   Social History  . Marital Status: Married    Spouse Name: N/A  . Number of Children: N/A  . Years of Education: N/A   Occupational History  . Not on file.   Social History Main Topics  . Smoking status: Never Smoker   . Smokeless tobacco: Never Used  . Alcohol Use: No  . Drug Use: No  . Sexual Activity: Yes     Comment: married to [place].  Three kids.  exercising daily.   Other Topics Concern  . Not on file   Social History Narrative   Family History  Problem Relation Age of Onset  . Heart disease  Mother   . Heart disease Father   . Stroke Father   . Hypertension Sister   . Colon cancer Neg Hx       Review of Systems  All other systems reviewed and are negative.      Objective:   Physical Exam  Constitutional: He is oriented to person, place, and time. He appears well-developed and well-nourished.  HENT:  Head: Normocephalic and atraumatic.  Right Ear: External ear normal.  Left Ear: External ear normal.  Nose: Nose normal.  Mouth/Throat: Oropharynx is clear and moist. No oropharyngeal exudate.  Eyes: Conjunctivae and EOM are normal. Pupils are equal, round, and reactive to light. Right eye exhibits no discharge. Left eye exhibits no discharge. No scleral icterus.  Neck: Normal range of motion. Neck supple. No JVD present. No tracheal deviation present. No thyromegaly  present.  Cardiovascular: Normal rate, regular rhythm, normal heart sounds and intact distal pulses.  Exam reveals no gallop and no friction rub.   No murmur heard. Pulmonary/Chest: Effort normal and breath sounds normal. No stridor. No respiratory distress. He has no wheezes. He has no rales. He exhibits no tenderness.  Abdominal: Soft. Bowel sounds are normal. He exhibits no distension and no mass. There is no tenderness. There is no rebound and no guarding.  Genitourinary: Rectum normal, prostate normal and penis normal.  Musculoskeletal: Normal range of motion. He exhibits no edema or tenderness.  Lymphadenopathy:    He has no cervical adenopathy.  Neurological: He is alert and oriented to person, place, and time. He has normal reflexes. He displays normal reflexes. No cranial nerve deficit. He exhibits normal muscle tone. Coordination normal.  Skin: Skin is warm. No rash noted. He is not diaphoretic. No erythema. No pallor.  Psychiatric: He has a normal mood and affect. His behavior is normal. Judgment and thought content normal.  Vitals reviewed.         Assessment & Plan:  Routine general medical examination at a health care facility  Essential hypertension  Hyperlipidemia  Type 2 diabetes mellitus with hyperglycemia, unspecified long term insulin use status ([place])  We had a very long discussion today regarding his diabetes. Over 20 minutes of time was spent just in diabetes education. I believe the patient has a opportunity to manage his blood sugars just with medication given his rapid response to insulin. Continue metformin 1000 mg by mouth twice a day. Discontinue Levemir. Replace with Jardiance 25 mg a day and recheck here in one month to see his response. Diabetic foot exam is performed today and is normal. Blood pressure is out of control however the patient states that this is a fluke. He will check his blood pressure everyday at home and bring the values with him when he  sees me in a month. He is convinced that he is just nervous today as it has been much better at other times. Cancer screening is up-to-date. His other lab work is within normal limits. His liver inflammation is improving with diet lifestyle and exercise changes that he is doing. In one month we'll check a urine microalbumin.

## 2015-10-14 ENCOUNTER — Other Ambulatory Visit: Payer: Self-pay | Admitting: Family Medicine

## 2015-10-14 DIAGNOSIS — E1165 Type 2 diabetes mellitus with hyperglycemia: Secondary | ICD-10-CM

## 2015-10-14 MED ORDER — METFORMIN HCL 1000 MG PO TABS: 1000.0000 mg | ORAL_TABLET | Freq: Two times a day (BID) | ORAL | Status: DC

## 2015-10-14 NOTE — Telephone Encounter (Signed)
Medication refilled per protocol. 

## 2015-10-21 ENCOUNTER — Encounter: Payer: Self-pay | Admitting: Family Medicine

## 2015-10-21 ENCOUNTER — Ambulatory Visit (INDEPENDENT_AMBULATORY_CARE_PROVIDER_SITE_OTHER): Payer: BLUE CROSS/BLUE SHIELD | Admitting: Family Medicine

## 2015-10-21 VITALS — BP 110/68 | HR 80 | Temp 98.1°F | Resp 16 | Wt 224.0 lb

## 2015-10-21 DIAGNOSIS — E1165 Type 2 diabetes mellitus with hyperglycemia: Secondary | ICD-10-CM

## 2015-10-21 MED ORDER — EMPAGLIFLOZIN 25 MG PO TABS: 25.0000 mg | ORAL_TABLET | Freq: Every day | ORAL | Status: DC

## 2015-10-21 NOTE — Progress Notes (Signed)
Subjective:    Patient ID: Joseph [name], male    DOB: 06-07-1960, 55 y.o.   MRN: [id number]  HPI 09/24/15 Was seen 1 month ago. At that time was found to have a hemoglobin A1c of 11.7. Was started on metformin 1000 mg twice a day and was placed on Levemir. He is currently on 22 units of Levemir daily. Fasting blood sugars are averaging between 130 and 148. He is having no episodes of hypoglycemia. His blood pressure today is extremely high but he is adamant that it is good at home and that this is a fluke because he is nervous being at the doctor's office. We spent over 20 minutes discussing diabetes and diabetic education and checking his blood sugars and going over options for treatment in addition to his physical.  At that time, my plan was: We had a very long discussion today regarding his diabetes. Over 20 minutes of time was spent just in diabetes education. I believe the patient has a opportunity to manage his blood sugars just with medication given his rapid response to insulin. Continue metformin 1000 mg by mouth twice a day. Discontinue Levemir. Replace with Jardiance 25 mg a day and recheck here in one month to see his response. Diabetic foot exam is performed today and is normal. Blood pressure is out of control however the patient states that this is a fluke. He will check his blood pressure everyday at home and bring the values with him when he sees me in a month. He is convinced that he is just nervous today as it has been much better at other times. Cancer screening is up-to-date. His other lab work is within normal limits. His liver inflammation is improving with diet lifestyle and exercise changes that he is doing. In one month we'll check a urine microalbumin.  10/21/15 Since stopping the insulin, his fasting blood sugar has been stable between 120 and 130. He denies any episodes of hypoglycemia. He denies any blurry vision, polyuria, polydipsia. Past Medical History  Diagnosis Date    . Hyperlipidemia   . Hypertension   . Allergy    Past Surgical History  Procedure Laterality Date  . Vasectomy  2006   Current Outpatient Prescriptions on File Prior to Visit  Medication Sig Dispense Refill  . amLODipine (NORVASC) 10 MG tablet Take 1 tablet (10 mg total) by mouth daily. 90 tablet 3  . aspirin 81 MG tablet Take 81 mg by mouth daily.    Marland Kitchen atorvastatin (LIPITOR) 80 MG tablet TAKE 1 TABLET BY MOUTH AT BEDTIME 90 tablet 1  . B-D UF III MINI PEN NEEDLES 31G X 5 MM MISC daily. as directed  3  . blood glucose meter kit and supplies KIT Dispense based on patient and insurance preference. Check fasting blood sugar each morning.  E11.65 1 each 0  . fluticasone (FLONASE) 50 MCG/ACT nasal spray Place 2 sprays into both nostrils daily. 48 g 11  . Glucosamine 500 MG CAPS Take 1 capsule by mouth daily.    Marland Kitchen glucose blood test strip 1 each by Other route every morning. check fasting blood sugar each morning before breakfast 100 each 3  . Lancets 30G MISC 1 each by Does not apply route daily. 100 each 3  . lisinopril-hydrochlorothiazide (PRINZIDE,ZESTORETIC) 20-12.5 MG per tablet TAKE 2 TABLETS BY MOUTH DAILY 180 tablet 3  . loratadine (CLARITIN) 10 MG tablet Take 10 mg by mouth daily as needed for allergies.    . Lysine 500  MG CAPS Take 1 capsule by mouth daily.    . metFORMIN (GLUCOPHAGE) 1000 MG tablet Take 1 tablet (1,000 mg total) by mouth 2 (two) times daily with a meal. 180 tablet 1  . Multiple Vitamins-Minerals (MULTIVITAMIN PO) Take by mouth.     No current facility-administered medications on file prior to visit.   No Known Allergies Social History   Social History  . Marital Status: Married    Spouse Name: N/A  . Number of Children: N/A  . Years of Education: N/A   Occupational History  . Not on file.   Social History Main Topics  . Smoking status: Never Smoker   . Smokeless tobacco: Never Used  . Alcohol Use: No  . Drug Use: No  . Sexual Activity: Yes      Comment: married to [place].  Three kids.  exercising daily.   Other Topics Concern  . Not on file   Social History Narrative   Family History  Problem Relation Age of Onset  . Heart disease Mother   . Heart disease Father   . Stroke Father   . Hypertension Sister   . Colon cancer Neg Hx       Review of Systems  All other systems reviewed and are negative.      Objective:   Physical Exam  Constitutional: He is oriented to person, place, and time. He appears well-developed and well-nourished.  HENT:  Head: Normocephalic and atraumatic.  Right Ear: External ear normal.  Left Ear: External ear normal.  Nose: Nose normal.  Mouth/Throat: Oropharynx is clear and moist. No oropharyngeal exudate.  Eyes: Conjunctivae and EOM are normal. Pupils are equal, round, and reactive to light. Right eye exhibits no discharge. Left eye exhibits no discharge. No scleral icterus.  Neck: Normal range of motion. Neck supple. No JVD present. No tracheal deviation present. No thyromegaly present.  Cardiovascular: Normal rate, regular rhythm, normal heart sounds and intact distal pulses.  Exam reveals no gallop and no friction rub.   No murmur heard. Pulmonary/Chest: Effort normal and breath sounds normal. No stridor. No respiratory distress. He has no wheezes. He has no rales. He exhibits no tenderness.  Abdominal: Soft. Bowel sounds are normal. He exhibits no distension and no mass. There is no tenderness. There is no rebound and no guarding.  Genitourinary: Rectum normal, prostate normal and penis normal.  Musculoskeletal: Normal range of motion. He exhibits no edema or tenderness.  Lymphadenopathy:    He has no cervical adenopathy.  Neurological: He is alert and oriented to person, place, and time. He has normal reflexes. No cranial nerve deficit. He exhibits normal muscle tone. Coordination normal.  Skin: Skin is warm. No rash noted. He is not diaphoretic. No erythema. No pallor.  Psychiatric:  He has a normal mood and affect. His behavior is normal. Judgment and thought content normal.  Vitals reviewed.         Assessment & Plan:  Diabetes type 2  Fasting blood sugars sound acceptable. I've asked the patient to begin checking 2 hour postprandial sugars. I recommended an annual diabetic eye exam. I would like to see the patient back at the end of August to check a microalbumin, CMP, fasting lipid panel, and hemoglobin A1c. Check 2 hour postprandial sugars. If higher than 200, I would recommend Actos versus trulicity.

## 2015-10-22 ENCOUNTER — Encounter: Payer: Self-pay | Admitting: Family Medicine

## 2015-11-01 ENCOUNTER — Ambulatory Visit: Payer: BLUE CROSS/BLUE SHIELD | Admitting: Family Medicine

## 2015-12-10 ENCOUNTER — Other Ambulatory Visit: Payer: Self-pay | Admitting: Family Medicine

## 2015-12-10 MED ORDER — EMPAGLIFLOZIN 25 MG PO TABS: 25.0000 mg | ORAL_TABLET | Freq: Every day | ORAL | 1 refills | Status: DC

## 2015-12-10 NOTE — Telephone Encounter (Signed)
Medication called/sent to requested pharmacy  

## 2015-12-22 ENCOUNTER — Other Ambulatory Visit: Payer: BLUE CROSS/BLUE SHIELD

## 2015-12-22 DIAGNOSIS — E1165 Type 2 diabetes mellitus with hyperglycemia: Principal | ICD-10-CM

## 2015-12-22 DIAGNOSIS — IMO0001 Reserved for inherently not codable concepts without codable children: Secondary | ICD-10-CM

## 2015-12-22 DIAGNOSIS — I10 Essential (primary) hypertension: Secondary | ICD-10-CM | POA: Diagnosis not present

## 2015-12-22 DIAGNOSIS — Z79899 Other long term (current) drug therapy: Secondary | ICD-10-CM

## 2015-12-22 DIAGNOSIS — E785 Hyperlipidemia, unspecified: Secondary | ICD-10-CM | POA: Diagnosis not present

## 2015-12-22 LAB — COMPLETE METABOLIC PANEL WITH GFR
ALT: 59 U/L — ABNORMAL HIGH (ref 9–46)
AST: 29 U/L (ref 10–35)
Albumin: 4.2 g/dL (ref 3.6–5.1)
Alkaline Phosphatase: 50 U/L (ref 40–115)
BILIRUBIN TOTAL: 0.5 mg/dL (ref 0.2–1.2)
BUN: 24 mg/dL (ref 7–25)
CHLORIDE: 102 mmol/L (ref 98–110)
CO2: 26 mmol/L (ref 20–31)
Calcium: 9.5 mg/dL (ref 8.6–10.3)
Creat: 1.12 mg/dL (ref 0.70–1.33)
GFR, EST AFRICAN AMERICAN: 86 mL/min (ref >=60)
GFR, EST NON AFRICAN AMERICAN: 75 mL/min (ref >=60)
Glucose, Bld: 115 mg/dL — ABNORMAL HIGH (ref 70–99)
POTASSIUM: 3.5 mmol/L (ref 3.5–5.3)
Sodium: 142 mmol/L (ref 135–146)
Total Protein: 6.6 g/dL (ref 6.1–8.1)

## 2015-12-22 LAB — LIPID PANEL
Cholesterol: 133 mg/dL (ref 125–200)
HDL: 35 mg/dL — AB (ref >=40)
LDL CALC: 68 mg/dL (ref <130)
TRIGLYCERIDES: 152 mg/dL — AB (ref <150)
Total CHOL/HDL Ratio: 3.8 Ratio (ref <=5.0)
VLDL: 30 mg/dL (ref <30)

## 2015-12-23 LAB — MICROALBUMIN / CREATININE URINE RATIO
Creatinine, Urine: 71 mg/dL (ref 20–370)
MICROALB UR: 1.1 mg/dL
MICROALB/CREAT RATIO: 15 ug/mg{creat} (ref <30)

## 2015-12-23 LAB — HEMOGLOBIN A1C
Hgb A1c MFr Bld: 6.6 % — ABNORMAL HIGH (ref <5.7)
Mean Plasma Glucose: 143 mg/dL

## 2015-12-24 ENCOUNTER — Encounter: Payer: Self-pay | Admitting: Family Medicine

## 2015-12-24 ENCOUNTER — Ambulatory Visit (INDEPENDENT_AMBULATORY_CARE_PROVIDER_SITE_OTHER): Payer: BLUE CROSS/BLUE SHIELD | Admitting: Family Medicine

## 2015-12-24 VITALS — BP 132/90 | HR 78 | Temp 98.0°F | Resp 16 | Ht 69.0 in | Wt 224.0 lb

## 2015-12-24 DIAGNOSIS — I10 Essential (primary) hypertension: Secondary | ICD-10-CM

## 2015-12-24 DIAGNOSIS — R945 Abnormal results of liver function studies: Secondary | ICD-10-CM

## 2015-12-24 DIAGNOSIS — E1165 Type 2 diabetes mellitus with hyperglycemia: Secondary | ICD-10-CM | POA: Diagnosis not present

## 2015-12-24 DIAGNOSIS — E785 Hyperlipidemia, unspecified: Secondary | ICD-10-CM | POA: Diagnosis not present

## 2015-12-24 DIAGNOSIS — R7989 Other specified abnormal findings of blood chemistry: Secondary | ICD-10-CM | POA: Diagnosis not present

## 2015-12-24 NOTE — Progress Notes (Signed)
Subjective:    Patient ID: Joseph [name], male    DOB: 11-08-1960, 55 y.o.   MRN: [id number]  HPI5/26/17 Was seen 1 month ago. At that time was found to have a hemoglobin A1c of 11.7. Was started on metformin 1000 mg twice a day and was placed on Levemir. He is currently on 22 units of Levemir daily. Fasting blood sugars are averaging between 130 and 148. He is having no episodes of hypoglycemia. His blood pressure today is extremely high but he is adamant that it is good at home and that this is a fluke because he is nervous being at the doctor's office. We spent over 20 minutes discussing diabetes and diabetic education and checking his blood sugars and going over options for treatment in addition to his physical.  At that time, my plan was: We had a very long discussion today regarding his diabetes. Over 20 minutes of time was spent just in diabetes education. I believe the patient has a opportunity to manage his blood sugars just with medication given his rapid response to insulin. Continue metformin 1000 mg by mouth twice a day. Discontinue Levemir. Replace with Jardiance 25 mg a day and recheck here in one month to see his response. Diabetic foot exam is performed today and is normal. Blood pressure is out of control however the patient states that this is a fluke. He will check his blood pressure everyday at home and bring the values with him when he sees me in a month. He is convinced that he is just nervous today as it has been much better at other times. Cancer screening is up-to-date. His other lab work is within normal limits. His liver inflammation is improving with diet lifestyle and exercise changes that he is doing. In one month we'll check a urine microalbumin.  10/21/15 Since stopping the insulin, his fasting blood sugar has been stable between 120 and 130. He denies any episodes of hypoglycemia. He denies any blurry vision, polyuria, polydipsia.  At that time, my plan was: Fasting blood  sugars sound acceptable. I've asked the patient to begin checking 2 hour postprandial sugars. I recommended an annual diabetic eye exam. I would like to see the patient back at the end of August to check a microalbumin, CMP, fasting lipid panel, and hemoglobin A1c. Check 2 hour postprandial sugars. If higher than 200, I would recommend Actos versus trulicity.  12/24/15 Patient has eye appointment scheduled for Monday. He is taking an aspirin. He denies any polyuria, polydipsia, or blurred vision. He denies any neuropathy in his feet. He denies any chest pain shortness of breath or dyspnea on exertion. He denies any myalgias or right upper quadrant pain. He is trying exercise 3 days a week. Past Medical History:  Diagnosis Date  . Allergy   . Hyperlipidemia   . Hypertension    Past Surgical History:  Procedure Laterality Date  . VASECTOMY  2006   Current Outpatient Prescriptions on File Prior to Visit  Medication Sig Dispense Refill  . amLODipine (NORVASC) 10 MG tablet Take 1 tablet (10 mg total) by mouth daily. 90 tablet 3  . aspirin 81 MG tablet Take 81 mg by mouth daily.    Marland Kitchen atorvastatin (LIPITOR) 80 MG tablet TAKE 1 TABLET BY MOUTH AT BEDTIME 90 tablet 1  . B-D UF III MINI PEN NEEDLES 31G X 5 MM MISC daily. as directed  3  . blood glucose meter kit and supplies KIT Dispense based on patient and  insurance preference. Check fasting blood sugar each morning.  E11.65 1 each 0  . empagliflozin (JARDIANCE) 25 MG TABS tablet Take 25 mg by mouth daily. 90 tablet 1  . fluticasone (FLONASE) 50 MCG/ACT nasal spray Place 2 sprays into both nostrils daily. 48 g 11  . Glucosamine 500 MG CAPS Take 1 capsule by mouth daily.    Marland Kitchen glucose blood test strip 1 each by Other route every morning. check fasting blood sugar each morning before breakfast 100 each 3  . Lancets 30G MISC 1 each by Does not apply route daily. 100 each 3  . lisinopril-hydrochlorothiazide (PRINZIDE,ZESTORETIC) 20-12.5 MG per tablet  TAKE 2 TABLETS BY MOUTH DAILY 180 tablet 3  . loratadine (CLARITIN) 10 MG tablet Take 10 mg by mouth daily as needed for allergies.    . Lysine 500 MG CAPS Take 1 capsule by mouth daily.    . metFORMIN (GLUCOPHAGE) 1000 MG tablet Take 1 tablet (1,000 mg total) by mouth 2 (two) times daily with a meal. 180 tablet 1  . Multiple Vitamins-Minerals (MULTIVITAMIN PO) Take by mouth.     No current facility-administered medications on file prior to visit.    No Known Allergies Social History   Social History  . Marital status: Married    Spouse name: N/A  . Number of children: N/A  . Years of education: N/A   Occupational History  . Not on file.   Social History Main Topics  . Smoking status: Never Smoker  . Smokeless tobacco: Never Used  . Alcohol use No  . Drug use: No  . Sexual activity: Yes     Comment: married to [place].  Three kids.  exercising daily.   Other Topics Concern  . Not on file   Social History Narrative  . No narrative on file   Family History  Problem Relation Age of Onset  . Heart disease Mother   . Heart disease Father   . Stroke Father   . Hypertension Sister   . Colon cancer Neg Hx       Review of Systems  Respiratory: Negative for apnea, cough, choking, chest tightness, shortness of breath and wheezing.   Cardiovascular: Negative for chest pain, palpitations and leg swelling.  Gastrointestinal: Negative for abdominal distention, abdominal pain and diarrhea.  Genitourinary: Negative for difficulty urinating, dysuria, frequency and hematuria.  All other systems reviewed and are negative.      Objective:   Physical Exam  Constitutional: He is oriented to person, place, and time. He appears well-developed and well-nourished.  HENT:  Head: Normocephalic and atraumatic.  Right Ear: External ear normal.  Left Ear: External ear normal.  Nose: Nose normal.  Mouth/Throat: Oropharynx is clear and moist. No oropharyngeal exudate.  Eyes: Conjunctivae  and EOM are normal. Pupils are equal, round, and reactive to light. Right eye exhibits no discharge. Left eye exhibits no discharge. No scleral icterus.  Neck: Normal range of motion. Neck supple. No JVD present. No tracheal deviation present. No thyromegaly present.  Cardiovascular: Normal rate, regular rhythm, normal heart sounds and intact distal pulses.  Exam reveals no gallop and no friction rub.   No murmur heard. Pulmonary/Chest: Effort normal and breath sounds normal. No stridor. No respiratory distress. He has no wheezes. He has no rales. He exhibits no tenderness.  Abdominal: Soft. Bowel sounds are normal. He exhibits no distension and no mass. There is no tenderness. There is no rebound and no guarding.  Genitourinary: Rectum normal, prostate normal and  penis normal.  Musculoskeletal: Normal range of motion. He exhibits no edema or tenderness.  Lymphadenopathy:    He has no cervical adenopathy.  Neurological: He is alert and oriented to person, place, and time. He has normal reflexes. No cranial nerve deficit. He exhibits normal muscle tone. Coordination normal.  Skin: Skin is warm. No rash noted. He is not diaphoretic. No erythema. No pallor.  Psychiatric: He has a normal mood and affect. His behavior is normal. Judgment and thought content normal.  Vitals reviewed.         Assessment & Plan:  Type 2 diabetes mellitus with hyperglycemia, unspecified long term insulin use status ([facility])  Essential hypertension  Hyperlipidemia  Elevated LFTs  Hemoglobin A1c has improved dramatically to 6.6. His blood pressure today is borderline but at home it is well controlled. His LDL cholesterol is at goal. 2 issues of concern is his elevated AST signifying fatty liver disease and also his low HDL. I have encouraged 10-15 pounds weight loss over the next 6 months and recheck lab work. I encouraged an hour of aerobic exercise 5 days a week. I believe that with 15 pounds weight loss we can cure  his elevated liver function tests, improve his HDL cholesterol, and better control his diabetes

## 2015-12-26 ENCOUNTER — Other Ambulatory Visit: Payer: Self-pay | Admitting: Family Medicine

## 2015-12-27 LAB — HM DIABETES EYE EXAM

## 2016-01-07 DIAGNOSIS — L570 Actinic keratosis: Secondary | ICD-10-CM | POA: Diagnosis not present

## 2016-01-07 DIAGNOSIS — L309 Dermatitis, unspecified: Secondary | ICD-10-CM | POA: Diagnosis not present

## 2016-01-07 DIAGNOSIS — Z85828 Personal history of other malignant neoplasm of skin: Secondary | ICD-10-CM | POA: Diagnosis not present

## 2016-02-17 ENCOUNTER — Encounter: Payer: Self-pay | Admitting: Family Medicine

## 2016-02-23 ENCOUNTER — Other Ambulatory Visit: Payer: Self-pay | Admitting: Physician Assistant

## 2016-02-23 NOTE — Telephone Encounter (Signed)
Rx refilled per protocol 

## 2016-03-17 ENCOUNTER — Ambulatory Visit (INDEPENDENT_AMBULATORY_CARE_PROVIDER_SITE_OTHER): Payer: BLUE CROSS/BLUE SHIELD

## 2016-03-17 DIAGNOSIS — Z23 Encounter for immunization: Secondary | ICD-10-CM | POA: Diagnosis not present

## 2016-04-20 ENCOUNTER — Ambulatory Visit (INDEPENDENT_AMBULATORY_CARE_PROVIDER_SITE_OTHER): Payer: BLUE CROSS/BLUE SHIELD | Admitting: Family Medicine

## 2016-04-20 ENCOUNTER — Encounter: Payer: Self-pay | Admitting: Family Medicine

## 2016-04-20 VITALS — BP 138/82 | HR 90 | Temp 98.2°F | Resp 14 | Ht 69.0 in | Wt 226.0 lb

## 2016-04-20 DIAGNOSIS — M79671 Pain in right foot: Secondary | ICD-10-CM | POA: Diagnosis not present

## 2016-04-20 NOTE — Progress Notes (Signed)
Subjective:    Patient ID: Joseph [name], male    DOB: 23-Aug-1960, 55 y.o.   MRN: [id number]  HPI  Patient reports 3 weeks of constant pain in his right heel, plantar surface. It is not near the insertion of the plantar fascia. It is most tender to palpation on the lateral edge of the plantar surface of the calcaneus. There is a 5 mm hard papule in this area the skin colored. It has normal dermal ridges. Papule is hyperkeratotic. This is concerning for a possible plantar's wart versus a foreign body. Using a razor blade, I performed a shave technique and remove the hyperkeratotic tissue. This revealed a round pore similar to a plantar wart.   Past Medical History:  Diagnosis Date  . Allergy   . Hyperlipidemia   . Hypertension    Past Surgical History:  Procedure Laterality Date  . VASECTOMY  2006   Current Outpatient Prescriptions on File Prior to Visit  Medication Sig Dispense Refill  . amLODipine (NORVASC) 10 MG tablet Take 1 tablet (10 mg total) by mouth daily. 90 tablet 3  . aspirin 81 MG tablet Take 81 mg by mouth daily.    Marland Kitchen atorvastatin (LIPITOR) 80 MG tablet TAKE 1 TABLET BY MOUTH AT BEDTIME 90 tablet 1  . B-D UF III MINI PEN NEEDLES 31G X 5 MM MISC daily. as directed  3  . blood glucose meter kit and supplies KIT Dispense based on patient and insurance preference. Check fasting blood sugar each morning.  E11.65 1 each 0  . empagliflozin (JARDIANCE) 25 MG TABS tablet Take 25 mg by mouth daily. 90 tablet 1  . fluticasone (FLONASE) 50 MCG/ACT nasal spray Place 2 sprays into both nostrils daily. 48 g 11  . Glucosamine 500 MG CAPS Take 1 capsule by mouth daily.    . Lancets 30G MISC 1 each by Does not apply route daily. 100 each 3  . lisinopril-hydrochlorothiazide (PRINZIDE,ZESTORETIC) 20-12.5 MG tablet TAKE 2 TABLETS BY MOUTH DAILY 180 tablet 3  . loratadine (CLARITIN) 10 MG tablet Take 10 mg by mouth daily as needed for allergies.    . Lysine 500 MG CAPS Take 1 capsule by mouth  daily.    . metFORMIN (GLUCOPHAGE) 1000 MG tablet Take 1 tablet (1,000 mg total) by mouth 2 (two) times daily with a meal. 180 tablet 1  . Multiple Vitamins-Minerals (MULTIVITAMIN PO) Take by mouth.    . ONE TOUCH ULTRA TEST test strip CHECK FASTING BLOOD SUGAR EACH MORNING 100 each 3   No current facility-administered medications on file prior to visit.    No Known Allergies Social History   Social History  . Marital status: Married    Spouse name: N/A  . Number of children: N/A  . Years of education: N/A   Occupational History  . Not on file.   Social History Main Topics  . Smoking status: Never Smoker  . Smokeless tobacco: Never Used  . Alcohol use No  . Drug use: No  . Sexual activity: Yes     Comment: married to Joseph [name].  Three kids.  exercising daily.   Other Topics Concern  . Not on file   Social History Narrative  . No narrative on file     Review of Systems  All other systems reviewed and are negative.      Objective:   Physical Exam  Cardiovascular: Normal rate and regular rhythm.   Pulmonary/Chest: Effort normal and breath sounds normal.  Vitals reviewed.  See hpi     Assessment & Plan:  Pain of right heel  [name] diagnosis includes plantar's wart versus foreign body. I believe this is a plantar's wart based on the appearance after performing debridement with a razor blade. I then treated the lesion with 30 seconds of liquid nitrogen cryotherapy. Should this area persist, I will perform a punch biopsy of the round pore using 2 mm punch.

## 2016-04-21 ENCOUNTER — Other Ambulatory Visit: Payer: Self-pay | Admitting: Family Medicine

## 2016-04-21 DIAGNOSIS — E1165 Type 2 diabetes mellitus with hyperglycemia: Secondary | ICD-10-CM

## 2016-05-12 DIAGNOSIS — L570 Actinic keratosis: Secondary | ICD-10-CM | POA: Diagnosis not present

## 2016-05-12 DIAGNOSIS — L578 Other skin changes due to chronic exposure to nonionizing radiation: Secondary | ICD-10-CM | POA: Diagnosis not present

## 2016-05-29 ENCOUNTER — Encounter: Payer: Self-pay | Admitting: Family Medicine

## 2016-05-29 ENCOUNTER — Ambulatory Visit (INDEPENDENT_AMBULATORY_CARE_PROVIDER_SITE_OTHER): Payer: BLUE CROSS/BLUE SHIELD | Admitting: Family Medicine

## 2016-05-29 VITALS — BP 140/88 | HR 82 | Temp 98.0°F | Resp 18 | Wt 228.0 lb

## 2016-05-29 DIAGNOSIS — B070 Plantar wart: Secondary | ICD-10-CM

## 2016-05-29 MED ORDER — HYDROCODONE-ACETAMINOPHEN 5-325 MG PO TABS: 1.0000 | ORAL_TABLET | Freq: Four times a day (QID) | ORAL | 0 refills | Status: DC | PRN

## 2016-05-29 MED ORDER — CEPHALEXIN 500 MG PO CAPS: 500.0000 mg | ORAL_CAPSULE | Freq: Three times a day (TID) | ORAL | 0 refills | Status: DC

## 2016-05-29 NOTE — Progress Notes (Signed)
Subjective:    Patient ID: Joseph [name], male    DOB: Nov 16, 1960, 56 y.o.   MRN: [id number]  HPI 04/20/16 Patient reports 3 weeks of constant pain in his right heel, plantar surface. It is not near the insertion of the plantar fascia. It is most tender to palpation on the lateral edge of the plantar surface of the calcaneus. There is a 5 mm hard papule in this area the skin colored. It has normal dermal ridges. Papule is hyperkeratotic. This is concerning for a possible plantar's wart versus a foreign body. Using a razor blade, I performed a shave technique and remove the hyperkeratotic tissue. This revealed a round pore similar to a plantar wart.  At that time, my plan was: Differential diagnosis includes plantar's wart versus foreign body. I believe this is a plantar's wart based on the appearance after performing debridement with a razor blade. I then treated the lesion with 30 seconds of liquid nitrogen cryotherapy. Should this area persist, I will perform a punch biopsy of the round pore using 2 mm punch.    05/29/16 Patient states the lesion has gotten larger. Now there is a hard extremely painful skin colored papule on the medial aspect of the right heel/plantar surface. It is approximately 5 mm in diameter. There are punctate purple capillary hemorrhages seen within the papule. Lesion is now obviously a plantar's wart. He is requesting therefore be removed given the fact conservative therapy last time failed to work. I offered the patient a referral to podiatrist. He is comfortable letting me remove it using a 5 mm punch biopsy Past Medical History:  Diagnosis Date  . Allergy   . Hyperlipidemia   . Hypertension    Past Surgical History:  Procedure Laterality Date  . VASECTOMY  2006   Current Outpatient Prescriptions on File Prior to Visit  Medication Sig Dispense Refill  . amLODipine (NORVASC) 10 MG tablet Take 1 tablet (10 mg total) by mouth daily. 90 tablet 3  . aspirin 81 MG  tablet Take 81 mg by mouth daily.    Marland Kitchen atorvastatin (LIPITOR) 80 MG tablet TAKE 1 TABLET BY MOUTH AT BEDTIME 90 tablet 1  . B-D UF III MINI PEN NEEDLES 31G X 5 MM MISC daily. as directed  3  . blood glucose meter kit and supplies KIT Dispense based on patient and insurance preference. Check fasting blood sugar each morning.  E11.65 1 each 0  . empagliflozin (JARDIANCE) 25 MG TABS tablet Take 25 mg by mouth daily. 90 tablet 1  . fluticasone (FLONASE) 50 MCG/ACT nasal spray Place 2 sprays into both nostrils daily. 48 g 11  . Glucosamine 500 MG CAPS Take 1 capsule by mouth daily.    . Lancets 30G MISC 1 each by Does not apply route daily. 100 each 3  . lisinopril-hydrochlorothiazide (PRINZIDE,ZESTORETIC) 20-12.5 MG tablet TAKE 2 TABLETS BY MOUTH DAILY 180 tablet 3  . loratadine (CLARITIN) 10 MG tablet Take 10 mg by mouth daily as needed for allergies.    . Lysine 500 MG CAPS Take 1 capsule by mouth daily.    . metFORMIN (GLUCOPHAGE) 1000 MG tablet TAKE 1 TABLET BY MOUTH TWICE A DAY WITH A MEAL 180 tablet 1  . Multiple Vitamins-Minerals (MULTIVITAMIN PO) Take by mouth.    . ONE TOUCH ULTRA TEST test strip CHECK FASTING BLOOD SUGAR EACH MORNING 100 each 3   No current facility-administered medications on file prior to visit.    No Known Allergies Social History  Social History  . Marital status: Married    Spouse name: N/A  . Number of children: N/A  . Years of education: N/A   Occupational History  . Not on file.   Social History Main Topics  . Smoking status: Never Smoker  . Smokeless tobacco: Never Used  . Alcohol use No  . Drug use: No  . Sexual activity: Yes     Comment: married to [place].  Three kids.  exercising daily.   Other Topics Concern  . Not on file   Social History Narrative  . No narrative on file     Review of Systems  All other systems reviewed and are negative.      Objective:   Physical Exam  Cardiovascular: Normal rate and regular rhythm.     Pulmonary/Chest: Effort normal and breath sounds normal.  Vitals reviewed.    See HPI     Assessment & Plan:  Plantar wart of right foot  The plantar's wart was anesthetized using 0.1% lidocaine with epinephrine. The area was prepped with betadine.  Then using a 5 mm punch biopsy the entire wart was cored down to the underlying dermis. Using a pair of forceps the wart was removed with traction and bluntly dissected using a pair of scissors. The base of the lesion was then debrided using a curette. The wound was then packed with quarter-inch iodoform gauze covered with Neosporin and bandaged.  Wound care discussed. Norco 5/325 1 poq6 hrs prn pain (10)

## 2016-06-06 ENCOUNTER — Other Ambulatory Visit: Payer: Self-pay | Admitting: Family Medicine

## 2016-06-06 NOTE — Telephone Encounter (Signed)
Medication refill for one time only.  Patient needs to be seen.  Letter sent for patient to call and schedule 

## 2016-06-07 ENCOUNTER — Telehealth: Payer: Self-pay | Admitting: Family Medicine

## 2016-06-07 NOTE — Telephone Encounter (Signed)
Patient's ins will no longer cover Jardiance but will cover Invokana or [place].

## 2016-06-08 MED ORDER — DAPAGLIFLOZIN PROPANEDIOL 10 MG PO TABS: 10.0000 mg | ORAL_TABLET | Freq: Every day | ORAL | 3 refills | Status: DC

## 2016-06-08 MED ORDER — EMPAGLIFLOZIN 25 MG PO TABS: 25.0000 mg | ORAL_TABLET | Freq: Every day | ORAL | 0 refills | Status: DC

## 2016-06-08 NOTE — Telephone Encounter (Signed)
Called and spoke to pt and he does not want to switch medications as he has a letter from his ins that states they will cover it. He has an appt to come in and discuss this with Dr. Dennard Schaumann. Called pharmacy and stopped the RX for [place].

## 2016-06-08 NOTE — Addendum Note (Signed)
Addended by: Shary Decamp B on: 06/08/2016 09:57 AM   Modules accepted: Orders

## 2016-06-08 NOTE — Telephone Encounter (Signed)
Medication called/sent to requested pharmacy and to [place] as ins will no longer cover medication.

## 2016-06-08 NOTE — Telephone Encounter (Signed)
farxiga 10 mg poqday

## 2016-06-09 ENCOUNTER — Other Ambulatory Visit: Payer: Self-pay | Admitting: Family Medicine

## 2016-06-09 ENCOUNTER — Other Ambulatory Visit: Payer: BLUE CROSS/BLUE SHIELD

## 2016-06-09 DIAGNOSIS — Z79899 Other long term (current) drug therapy: Secondary | ICD-10-CM

## 2016-06-09 DIAGNOSIS — I10 Essential (primary) hypertension: Secondary | ICD-10-CM | POA: Diagnosis not present

## 2016-06-09 DIAGNOSIS — E785 Hyperlipidemia, unspecified: Secondary | ICD-10-CM

## 2016-06-09 DIAGNOSIS — E1165 Type 2 diabetes mellitus with hyperglycemia: Secondary | ICD-10-CM

## 2016-06-09 LAB — COMPLETE METABOLIC PANEL WITH GFR
ALT: 74 U/L — ABNORMAL HIGH (ref 9–46)
AST: 40 U/L — ABNORMAL HIGH (ref 10–35)
Albumin: 4.4 g/dL (ref 3.6–5.1)
Alkaline Phosphatase: 55 U/L (ref 40–115)
BILIRUBIN TOTAL: 0.6 mg/dL (ref 0.2–1.2)
BUN: 22 mg/dL (ref 7–25)
CALCIUM: 9.4 mg/dL (ref 8.6–10.3)
CO2: 26 mmol/L (ref 20–31)
Chloride: 103 mmol/L (ref 98–110)
Creat: 1.02 mg/dL (ref 0.70–1.33)
GFR, Est Non African American: 84 mL/min (ref >=60)
Glucose, Bld: 137 mg/dL — ABNORMAL HIGH (ref 70–99)
Potassium: 3.5 mmol/L (ref 3.5–5.3)
Sodium: 141 mmol/L (ref 135–146)
TOTAL PROTEIN: 7.1 g/dL (ref 6.1–8.1)

## 2016-06-09 LAB — LIPID PANEL
CHOL/HDL RATIO: 4.3 ratio (ref <5.0)
Cholesterol: 145 mg/dL (ref <200)
HDL: 34 mg/dL — AB (ref >40)
LDL Cholesterol: 74 mg/dL (ref <100)
Triglycerides: 186 mg/dL — ABNORMAL HIGH (ref <150)
VLDL: 37 mg/dL — AB (ref <30)

## 2016-06-10 LAB — HEMOGLOBIN A1C
Hgb A1c MFr Bld: 7 % — ABNORMAL HIGH (ref <5.7)
MEAN PLASMA GLUCOSE: 154 mg/dL

## 2016-06-10 LAB — MICROALBUMIN / CREATININE URINE RATIO
CREATININE, URINE: 61 mg/dL (ref 20–370)
Microalb Creat Ratio: 30 mcg/mg creat — ABNORMAL HIGH (ref <30)
Microalb, Ur: 1.8 mg/dL

## 2016-06-13 ENCOUNTER — Ambulatory Visit (INDEPENDENT_AMBULATORY_CARE_PROVIDER_SITE_OTHER): Payer: BLUE CROSS/BLUE SHIELD | Admitting: Family Medicine

## 2016-06-13 ENCOUNTER — Encounter: Payer: Self-pay | Admitting: Family Medicine

## 2016-06-13 VITALS — BP 166/90 | Temp 97.8°F | Resp 16 | Ht 68.0 in | Wt 229.0 lb

## 2016-06-13 DIAGNOSIS — E7800 Pure hypercholesterolemia, unspecified: Secondary | ICD-10-CM | POA: Diagnosis not present

## 2016-06-13 DIAGNOSIS — I10 Essential (primary) hypertension: Secondary | ICD-10-CM | POA: Diagnosis not present

## 2016-06-13 DIAGNOSIS — R945 Abnormal results of liver function studies: Secondary | ICD-10-CM

## 2016-06-13 DIAGNOSIS — R7989 Other specified abnormal findings of blood chemistry: Secondary | ICD-10-CM | POA: Diagnosis not present

## 2016-06-13 DIAGNOSIS — E1165 Type 2 diabetes mellitus with hyperglycemia: Secondary | ICD-10-CM

## 2016-06-13 MED ORDER — EMPAGLIFLOZIN 25 MG PO TABS: 25.0000 mg | ORAL_TABLET | Freq: Every day | ORAL | 1 refills | Status: DC

## 2016-06-13 NOTE — Progress Notes (Signed)
Subjective:    Patient ID: Joseph [name], male    DOB: 1961-04-23, 56 y.o.   MRN: [id number]  Medication Refill  Pertinent negatives include no abdominal pain, chest pain or coughing.  09/24/15 Was seen 1 month ago. At that time was found to have a hemoglobin A1c of 11.7. Was started on metformin 1000 mg twice a day and was placed on Levemir. He is currently on 22 units of Levemir daily. Fasting blood sugars are averaging between 130 and 148. He is having no episodes of hypoglycemia. His blood pressure today is extremely high but he is adamant that it is good at home and that this is a fluke because he is nervous being at the doctor's office. We spent over 20 minutes discussing diabetes and diabetic education and checking his blood sugars and going over options for treatment in addition to his physical.  At that time, my plan was: We had a very long discussion today regarding his diabetes. Over 20 minutes of time was spent just in diabetes education. I believe the patient has a opportunity to manage his blood sugars just with medication given his rapid response to insulin. Continue metformin 1000 mg by mouth twice a day. Discontinue Levemir. Replace with Jardiance 25 mg a day and recheck here in one month to see his response. Diabetic foot exam is performed today and is normal. Blood pressure is out of control however the patient states that this is a fluke. He will check his blood pressure everyday at home and bring the values with him when he sees me in a month. He is convinced that he is just nervous today as it has been much better at other times. Cancer screening is up-to-date. His other lab work is within normal limits. His liver inflammation is improving with diet lifestyle and exercise changes that he is doing. In one month we'll check a urine microalbumin.  10/21/15 Since stopping the insulin, his fasting blood sugar has been stable between 120 and 130. He denies any episodes of hypoglycemia. He  denies any blurry vision, polyuria, polydipsia.  At that time, my plan was: Fasting blood sugars sound acceptable. I've asked the patient to begin checking 2 hour postprandial sugars. I recommended an annual diabetic eye exam. I would like to see the patient back at the end of August to check a microalbumin, CMP, fasting lipid panel, and hemoglobin A1c. Check 2 hour postprandial sugars. If higher than 200, I would recommend Actos versus trulicity.  12/24/15 Patient has eye appointment scheduled for Monday. He is taking an aspirin. He denies any polyuria, polydipsia, or blurred vision. He denies any neuropathy in his feet. He denies any chest pain shortness of breath or dyspnea on exertion. He denies any myalgias or right upper quadrant pain. He is trying exercise 3 days a week.  At that time, my plan was: Hemoglobin A1c has improved dramatically to 6.6. His blood pressure today is borderline but at home it is well controlled. His LDL cholesterol is at goal. 2 issues of concern is his elevated AST signifying fatty liver disease and also his low HDL. I have encouraged 10-15 pounds weight loss over the next 6 months and recheck lab work. I encouraged an hour of aerobic exercise 5 days a week. I believe that with 15 pounds weight loss we can cure his elevated liver function tests, improve his HDL cholesterol, and better control his diabetes  06/13/16 Patient has not been able to exercise because of the pain  in his right heel. Please see my previous office visits outlining the treatment of his plantar wart. As a result his weight has not dropped. Because of his inactivity, his hemoglobin A1c has gone up slightly. His blood pressure here today is elevated however at home he checking his blood pressure and finding it to be 130/80. He denies any chest pain shortness of breath or dyspnea on exertion. He denies any myalgias or right upper quadrant pain. He denies any polyuria, polydipsia, or blurred vision. His most  recent lab work as listed below Appointment on 06/09/2016  Component Date Value Ref Range Status  . Creatinine, Urine 06/09/2016 61  20 - 370 mg/dL Final  . Microalb, Ur 06/09/2016 1.8  Not estab mg/dL Final  . Microalb Creat Ratio 06/09/2016 30* <30 mcg/mg creat Final   Comment: The ADA has defined abnormalities in albumin excretion as follows:           Category           Result                            (mcg/mg creatinine)                 Normal:    <30       Microalbuminuria:    30 - 299   Clinical albuminuria:    > or = 300   The ADA recommends that at least two of three specimens collected within a 3 - 6 month period be abnormal before considering a patient to be within a diagnostic category.     . Hgb A1c MFr Bld 06/09/2016 7.0* <5.7 % Final   Comment:   For someone without known diabetes, a hemoglobin A1c value of 6.5% or greater indicates that they may have diabetes and this should be confirmed with a follow-up test.   For someone with known diabetes, a value <7% indicates that their diabetes is well controlled and a value greater than or equal to 7% indicates suboptimal control. A1c targets should be individualized based on duration of diabetes, age, comorbid conditions, and other considerations.   Currently, no consensus exists for use of hemoglobin A1c for diagnosis of diabetes for children.     . Mean Plasma Glucose 06/09/2016 154  mg/dL Final  . Cholesterol 06/09/2016 145  <200 mg/dL Final  . Triglycerides 06/09/2016 186* <150 mg/dL Final  . HDL 06/09/2016 34* >40 mg/dL Final  . Total CHOL/HDL Ratio 06/09/2016 4.3  <5.0 Ratio Final  . VLDL 06/09/2016 37* <30 mg/dL Final  . LDL Cholesterol 06/09/2016 74  <100 mg/dL Final  . Sodium 06/09/2016 141  135 - 146 mmol/L Final  . Potassium 06/09/2016 3.5  3.5 - 5.3 mmol/L Final  . Chloride 06/09/2016 103  98 - 110 mmol/L Final  . CO2 06/09/2016 26  20 - 31 mmol/L Final  . Glucose, Bld 06/09/2016 137* 70 - 99 mg/dL  Final  . BUN 06/09/2016 22  7 - 25 mg/dL Final  . Creat 06/09/2016 1.02  0.70 - 1.33 mg/dL Final   Comment:   For patients > or = 54 years of age: The upper reference limit for Creatinine is approximately 13% higher for people identified as African-American.     . Total Bilirubin 06/09/2016 0.6  0.2 - 1.2 mg/dL Final  . Alkaline Phosphatase 06/09/2016 55  40 - 115 U/L Final  . AST 06/09/2016 40* 10 - 35 U/L Final  .  ALT 06/09/2016 74* 9 - 46 U/L Final  . Total Protein 06/09/2016 7.1  6.1 - 8.1 g/dL Final  . Albumin 06/09/2016 4.4  3.6 - 5.1 g/dL Final  . Calcium 06/09/2016 9.4  8.6 - 10.3 mg/dL Final  . GFR, Est African American 06/09/2016 >89  >=60 mL/min Final  . GFR, Est Non African American 06/09/2016 84  >=60 mL/min Final    Past Medical History:  Diagnosis Date  . Allergy   . Hyperlipidemia   . Hypertension    Past Surgical History:  Procedure Laterality Date  . VASECTOMY  2006   Current Outpatient Prescriptions on File Prior to Visit  Medication Sig Dispense Refill  . amLODipine (NORVASC) 10 MG tablet Take 1 tablet (10 mg total) by mouth daily. 90 tablet 3  . aspirin 81 MG tablet Take 81 mg by mouth daily.    Marland Kitchen atorvastatin (LIPITOR) 80 MG tablet TAKE 1 TABLET BY MOUTH AT BEDTIME 90 tablet 1  . B-D UF III MINI PEN NEEDLES 31G X 5 MM MISC daily. as directed  3  . blood glucose meter kit and supplies KIT Dispense based on patient and insurance preference. Check fasting blood sugar each morning.  E11.65 1 each 0  . fluticasone (FLONASE) 50 MCG/ACT nasal spray Place 2 sprays into both nostrils daily. 48 g 11  . Glucosamine 500 MG CAPS Take 1 capsule by mouth daily.    Marland Kitchen HYDROcodone-acetaminophen (NORCO) 5-325 MG tablet Take 1 tablet by mouth every 6 (six) hours as needed for moderate pain. 10 tablet 0  . Lancets 30G MISC 1 each by Does not apply route daily. 100 each 3  . lisinopril-hydrochlorothiazide (PRINZIDE,ZESTORETIC) 20-12.5 MG tablet TAKE 2 TABLETS BY MOUTH DAILY  180 tablet 3  . loratadine (CLARITIN) 10 MG tablet Take 10 mg by mouth daily as needed for allergies.    . Lysine 500 MG CAPS Take 1 capsule by mouth daily.    . metFORMIN (GLUCOPHAGE) 1000 MG tablet TAKE 1 TABLET BY MOUTH TWICE A DAY WITH A MEAL 180 tablet 1  . Multiple Vitamins-Minerals (MULTIVITAMIN PO) Take by mouth.    . ONE TOUCH ULTRA TEST test strip CHECK FASTING BLOOD SUGAR EACH MORNING 100 each 3   No current facility-administered medications on file prior to visit.    No Known Allergies Social History   Social History  . Marital status: Married    Spouse name: N/A  . Number of children: N/A  . Years of education: N/A   Occupational History  . Not on file.   Social History Main Topics  . Smoking status: Never Smoker  . Smokeless tobacco: Never Used  . Alcohol use No  . Drug use: No  . Sexual activity: Yes     Comment: married to [place].  Three kids.  exercising daily.   Other Topics Concern  . Not on file   Social History Narrative  . No narrative on file   Family History  Problem Relation Age of Onset  . Heart disease Mother   . Heart disease Father   . Stroke Father   . Hypertension Sister   . Colon cancer Neg Hx       Review of Systems  Respiratory: Negative for apnea, cough, choking, chest tightness, shortness of breath and wheezing.   Cardiovascular: Negative for chest pain, palpitations and leg swelling.  Gastrointestinal: Negative for abdominal distention, abdominal pain and diarrhea.  Genitourinary: Negative for difficulty urinating, dysuria, frequency and hematuria.  All  other systems reviewed and are negative.      Objective:   Physical Exam  Constitutional: He is oriented to person, place, and time. He appears well-developed and well-nourished.  HENT:  Head: Normocephalic and atraumatic.  Right Ear: External ear normal.  Left Ear: External ear normal.  Nose: Nose normal.  Mouth/Throat: Oropharynx is clear and moist. No oropharyngeal  exudate.  Eyes: Conjunctivae and EOM are normal. Pupils are equal, round, and reactive to light. Right eye exhibits no discharge. Left eye exhibits no discharge. No scleral icterus.  Neck: Normal range of motion. Neck supple. No JVD present. No tracheal deviation present. No thyromegaly present.  Cardiovascular: Normal rate, regular rhythm, normal heart sounds and intact distal pulses.  Exam reveals no gallop and no friction rub.   No murmur heard. Pulmonary/Chest: Effort normal and breath sounds normal. No stridor. No respiratory distress. He has no wheezes. He has no rales. He exhibits no tenderness.  Abdominal: Soft. Bowel sounds are normal. He exhibits no distension and no mass. There is no tenderness. There is no rebound and no guarding.  Genitourinary: Rectum normal, prostate normal and penis normal.  Musculoskeletal: Normal range of motion. He exhibits no edema or tenderness.  Lymphadenopathy:    He has no cervical adenopathy.  Neurological: He is alert and oriented to person, place, and time. He has normal reflexes. No cranial nerve deficit. He exhibits normal muscle tone. Coordination normal.  Skin: Skin is warm. No rash noted. He is not diaphoretic. No erythema. No pallor.  Psychiatric: He has a normal mood and affect. His behavior is normal. Judgment and thought content normal.  Vitals reviewed.         Assessment & Plan:  Essential hypertension  Type 2 diabetes mellitus with hyperglycemia, unspecified long term insulin use status (HCC)  Pure hypercholesterolemia  Elevated LFTs  Discontinue Lipitor and repeat CMP in 2 weeks to see if elevated liver function tests are due to statin. I suspect however that they're due to inactivity and fatty liver disease which is been confirmed on ultrasound. If they do not improve off Lipitor I will have the patient resume Lipitor. I will really want him to focus hard on diet exercise and weight loss to get his hemoglobin A1c below 6.5. If  not successful in 6 months, we will add Victoza. His blood pressure today is elevated at his home blood pressures are acceptable. Diabetic foot exam is performed and is normal. Recommended annual diabetic eye exam

## 2016-06-14 ENCOUNTER — Telehealth: Payer: Self-pay | Admitting: Family Medicine

## 2016-06-14 NOTE — Telephone Encounter (Signed)
PA submitted through [email or web address] - Next Steps  The plan will send a determination in CoverMyMeds and via fax, typically within 1 to 5 business days.

## 2016-06-19 NOTE — Telephone Encounter (Signed)
Pt called to check status of PA  - still have not seen anything on it yet but did leave him samples up front.

## 2016-06-21 NOTE — Telephone Encounter (Signed)
Received additional information to fill out for medication - forms filled out and faxed back

## 2016-06-23 ENCOUNTER — Encounter: Payer: Self-pay | Admitting: Family Medicine

## 2016-06-23 NOTE — Telephone Encounter (Signed)
Joseph [name] has been denied stating he must try and failed one medication on formulary. [place] or Invokana. Per Dr. Dennard Schaumann he would like him on Farxiga 10mg . Have sent pt mychart message with all information. Will await answer from pt as he does have samples of Jardiance for 2 weeks.

## 2016-06-26 NOTE — Telephone Encounter (Signed)
Received email from pt and he would like to stay on Jardiance and will just pay for it out of pocket.

## 2016-06-28 ENCOUNTER — Other Ambulatory Visit: Payer: BLUE CROSS/BLUE SHIELD

## 2016-06-28 DIAGNOSIS — R7989 Other specified abnormal findings of blood chemistry: Secondary | ICD-10-CM

## 2016-06-28 DIAGNOSIS — R945 Abnormal results of liver function studies: Principal | ICD-10-CM

## 2016-06-28 LAB — COMPREHENSIVE METABOLIC PANEL
ALBUMIN: 4.1 g/dL (ref 3.6–5.1)
ALT: 81 U/L — ABNORMAL HIGH (ref 9–46)
AST: 34 U/L (ref 10–35)
Alkaline Phosphatase: 50 U/L (ref 40–115)
BUN: 21 mg/dL (ref 7–25)
CALCIUM: 9.5 mg/dL (ref 8.6–10.3)
CHLORIDE: 106 mmol/L (ref 98–110)
CO2: 23 mmol/L (ref 20–31)
Creat: 1.22 mg/dL (ref 0.70–1.33)
Glucose, Bld: 176 mg/dL — ABNORMAL HIGH (ref 70–99)
POTASSIUM: 3.4 mmol/L — AB (ref 3.5–5.3)
Sodium: 139 mmol/L (ref 135–146)
Total Bilirubin: 0.5 mg/dL (ref 0.2–1.2)
Total Protein: 6.6 g/dL (ref 6.1–8.1)

## 2016-06-29 ENCOUNTER — Encounter: Payer: Self-pay | Admitting: Family Medicine

## 2016-07-07 DIAGNOSIS — L579 Skin changes due to chronic exposure to nonionizing radiation, unspecified: Secondary | ICD-10-CM | POA: Diagnosis not present

## 2016-07-07 DIAGNOSIS — L570 Actinic keratosis: Secondary | ICD-10-CM | POA: Diagnosis not present

## 2016-08-22 ENCOUNTER — Telehealth: Payer: Self-pay | Admitting: *Deleted

## 2016-08-22 NOTE — Telephone Encounter (Signed)
Received request from pharmacy for [place] on Jardiance.   PA submitted.   Dx: E11.65- uncontrolled DM

## 2016-08-22 NOTE — Telephone Encounter (Signed)
Your information has been submitted to [employer]. To check for an updated outcome later, reopen this PA request from your dashboard. If [employer] has not responded to your request within 24 hours, contact [employer] at [phone]. If you think there may be a problem with your PA request, use our live chat feature at the bottom right.    

## 2016-08-23 MED ORDER — EMPAGLIFLOZIN 25 MG PO TABS: 25.0000 mg | ORAL_TABLET | Freq: Every day | ORAL | 1 refills | Status: DC

## 2016-08-23 NOTE — Telephone Encounter (Signed)
Received PA determination.   PA approved 08/22/2016- 08/22/2017.  Pharmacy made aware.

## 2016-09-17 ENCOUNTER — Other Ambulatory Visit: Payer: Self-pay | Admitting: Family Medicine

## 2016-09-27 ENCOUNTER — Other Ambulatory Visit: Payer: Self-pay | Admitting: Family Medicine

## 2016-09-27 DIAGNOSIS — I10 Essential (primary) hypertension: Secondary | ICD-10-CM

## 2016-09-27 DIAGNOSIS — E7800 Pure hypercholesterolemia, unspecified: Secondary | ICD-10-CM

## 2016-09-27 DIAGNOSIS — E1165 Type 2 diabetes mellitus with hyperglycemia: Secondary | ICD-10-CM

## 2016-10-04 ENCOUNTER — Other Ambulatory Visit: Payer: BLUE CROSS/BLUE SHIELD

## 2016-10-04 DIAGNOSIS — I10 Essential (primary) hypertension: Secondary | ICD-10-CM

## 2016-10-04 DIAGNOSIS — E1165 Type 2 diabetes mellitus with hyperglycemia: Secondary | ICD-10-CM

## 2016-10-04 DIAGNOSIS — Z125 Encounter for screening for malignant neoplasm of prostate: Secondary | ICD-10-CM | POA: Diagnosis not present

## 2016-10-04 DIAGNOSIS — E7800 Pure hypercholesterolemia, unspecified: Secondary | ICD-10-CM

## 2016-10-05 LAB — CBC WITH DIFFERENTIAL/PLATELET
BASOS PCT: 0 %
Basophils Absolute: 0 cells/uL (ref 0–200)
EOS ABS: 282 {cells}/uL (ref 15–500)
Eosinophils Relative: 3 %
HEMATOCRIT: 48.8 % (ref 38.5–50.0)
Hemoglobin: 16.1 g/dL (ref 13.0–17.0)
Lymphocytes Relative: 28 %
Lymphs Abs: 2632 cells/uL (ref 850–3900)
MCH: 28.9 pg (ref 27.0–33.0)
MCHC: 33 g/dL (ref 32.0–36.0)
MCV: 87.6 fL (ref 80.0–100.0)
MONO ABS: 752 {cells}/uL (ref 200–950)
MPV: 10.3 fL (ref 7.5–12.5)
Monocytes Relative: 8 %
NEUTROS ABS: 5734 {cells}/uL (ref 1500–7800)
Neutrophils Relative %: 61 %
PLATELETS: 240 10*3/uL (ref 140–400)
RBC: 5.57 MIL/uL (ref 4.20–5.80)
RDW: 15 % (ref 11.0–15.0)
WBC: 9.4 10*3/uL (ref 3.8–10.8)

## 2016-10-05 LAB — COMPREHENSIVE METABOLIC PANEL
ALK PHOS: 55 U/L (ref 40–115)
ALT: 70 U/L — AB (ref 9–46)
AST: 31 U/L (ref 10–35)
Albumin: 4.1 g/dL (ref 3.6–5.1)
BILIRUBIN TOTAL: 0.5 mg/dL (ref 0.2–1.2)
BUN: 23 mg/dL (ref 7–25)
CALCIUM: 9.2 mg/dL (ref 8.6–10.3)
CO2: 24 mmol/L (ref 20–31)
Chloride: 103 mmol/L (ref 98–110)
Creat: 1.11 mg/dL (ref 0.70–1.33)
GLUCOSE: 123 mg/dL — AB (ref 70–99)
POTASSIUM: 3.3 mmol/L — AB (ref 3.5–5.3)
Sodium: 140 mmol/L (ref 135–146)
TOTAL PROTEIN: 6.6 g/dL (ref 6.1–8.1)

## 2016-10-05 LAB — PSA: PSA: 1.1 ng/mL (ref <=4.0)

## 2016-10-05 LAB — HEMOGLOBIN A1C
HEMOGLOBIN A1C: 7 % — AB (ref <5.7)
Mean Plasma Glucose: 154 mg/dL

## 2016-10-05 LAB — LIPID PANEL
CHOL/HDL RATIO: 3.9 ratio (ref <5.0)
CHOLESTEROL: 125 mg/dL (ref <200)
HDL: 32 mg/dL — ABNORMAL LOW (ref >40)
LDL Cholesterol: 57 mg/dL (ref <100)
Triglycerides: 180 mg/dL — ABNORMAL HIGH (ref <150)
VLDL: 36 mg/dL — AB (ref <30)

## 2016-10-06 ENCOUNTER — Other Ambulatory Visit: Payer: BLUE CROSS/BLUE SHIELD

## 2016-10-10 ENCOUNTER — Ambulatory Visit (INDEPENDENT_AMBULATORY_CARE_PROVIDER_SITE_OTHER): Payer: BLUE CROSS/BLUE SHIELD | Admitting: Family Medicine

## 2016-10-10 ENCOUNTER — Encounter: Payer: Self-pay | Admitting: Family Medicine

## 2016-10-10 VITALS — BP 134/90 | HR 78 | Temp 98.0°F | Resp 18 | Ht 69.0 in | Wt 225.0 lb

## 2016-10-10 DIAGNOSIS — E119 Type 2 diabetes mellitus without complications: Secondary | ICD-10-CM | POA: Diagnosis not present

## 2016-10-10 DIAGNOSIS — Z0000 Encounter for general adult medical examination without abnormal findings: Secondary | ICD-10-CM | POA: Diagnosis not present

## 2016-10-10 DIAGNOSIS — I10 Essential (primary) hypertension: Secondary | ICD-10-CM

## 2016-10-10 DIAGNOSIS — E7800 Pure hypercholesterolemia, unspecified: Secondary | ICD-10-CM

## 2016-10-10 NOTE — Progress Notes (Signed)
Subjective:    Patient ID: Joseph [name], male    DOB: May 16, 1960, 56 y.o.   MRN: [id number]  Medication Refill  Pertinent negatives include no abdominal pain, chest pain or coughing.  09/24/15 Was seen 1 month ago. At that time was found to have a hemoglobin A1c of 11.7. Was started on metformin 1000 mg twice a day and was placed on Levemir. He is currently on 22 units of Levemir daily. Fasting blood sugars are averaging between 130 and 148. He is having no episodes of hypoglycemia. His blood pressure today is extremely high but he is adamant that it is good at home and that this is a fluke because he is nervous being at the doctor's office. We spent over 20 minutes discussing diabetes and diabetic education and checking his blood sugars and going over options for treatment in addition to his physical.  At that time, my plan was: We had a very long discussion today regarding his diabetes. Over 20 minutes of time was spent just in diabetes education. I believe the patient has a opportunity to manage his blood sugars just with medication given his rapid response to insulin. Continue metformin 1000 mg by mouth twice a day. Discontinue Levemir. Replace with Jardiance 25 mg a day and recheck here in one month to see his response. Diabetic foot exam is performed today and is normal. Blood pressure is out of control however the patient states that this is a fluke. He will check his blood pressure everyday at home and bring the values with him when he sees me in a month. He is convinced that he is just nervous today as it has been much better at other times. Cancer screening is up-to-date. His other lab work is within normal limits. His liver inflammation is improving with diet lifestyle and exercise changes that he is doing. In one month we'll check a urine microalbumin.  10/21/15 Since stopping the insulin, his fasting blood sugar has been stable between 120 and 130. He denies any episodes of hypoglycemia. He  denies any blurry vision, polyuria, polydipsia.  At that time, my plan was: Fasting blood sugars sound acceptable. I've asked the patient to begin checking 2 hour postprandial sugars. I recommended an annual diabetic eye exam. I would like to see the patient back at the end of August to check a microalbumin, CMP, fasting lipid panel, and hemoglobin A1c. Check 2 hour postprandial sugars. If higher than 200, I would recommend Actos versus trulicity.  12/24/15 Patient has eye appointment scheduled for Monday. He is taking an aspirin. He denies any polyuria, polydipsia, or blurred vision. He denies any neuropathy in his feet. He denies any chest pain shortness of breath or dyspnea on exertion. He denies any myalgias or right upper quadrant pain. He is trying exercise 3 days a week.  At that time, my plan was: Hemoglobin A1c has improved dramatically to 6.6. His blood pressure today is borderline but at home it is well controlled. His LDL cholesterol is at goal. 2 issues of concern is his elevated AST signifying fatty liver disease and also his low HDL. I have encouraged 10-15 pounds weight loss over the next 6 months and recheck lab work. I encouraged an hour of aerobic exercise 5 days a week. I believe that with 15 pounds weight loss we can cure his elevated liver function tests, improve his HDL cholesterol, and better control his diabetes  06/13/16 Patient has not been able to exercise because of the pain  in his right heel. Please see my previous office visits outlining the treatment of his plantar wart. As a result his weight has not dropped. Because of his inactivity, his hemoglobin A1c has gone up slightly. His blood pressure here today is elevated however at home he checking his blood pressure and finding it to be 130/80. He denies any chest pain shortness of breath or dyspnea on exertion. He denies any myalgias or right upper quadrant pain. He denies any polyuria, polydipsia, or blurred vision.  At that  time, my plan was: Discontinue Lipitor and repeat CMP in 2 weeks to see if elevated liver function tests are due to statin. I suspect however that they're due to inactivity and fatty liver disease which is been confirmed on ultrasound. If they do not improve off Lipitor I will have the patient resume Lipitor. I will really want him to focus hard on diet exercise and weight loss to get his hemoglobin A1c below 6.5. If not successful in 6 months, we will add Victoza. His blood pressure today is elevated at his home blood pressures are acceptable. Diabetic foot exam is performed and is normal. Recommended annual diabetic eye exam  10/10/16 Here today for complete physical exam.  He has been working with diet and exercise having originally lost 10 pounds he has gained some of that back. He admits that he has not been is stringently with his diet as he would like is consistent with his exercises he would like. He denies any chest pain or shortness of breath. He denies any polyuria polydipsia or blurry vision. His most recent lab work as listed below. Lab on 10/04/2016  Component Date Value Ref Range Status  . PSA 10/04/2016 1.1  <=4.0 ng/mL Final   Comment:   The total PSA value from this assay system is standardized against the WHO standard. The test result will be approximately 20% lower when compared to the equimolar-standardized total PSA ([employer] Coulter). Comparison of serial PSA results should be interpreted with this fact in mind.   This test was performed using the Siemens chemiluminescent method. Values obtained from different assay methods cannot be used interchangeably. PSA levels, regardless of value, should not be interpreted as absolute evidence of the presence or absence of disease.     . WBC 10/04/2016 9.4  3.8 - 10.8 K/uL Final  . RBC 10/04/2016 5.57  4.20 - 5.80 MIL/uL Final  . Hemoglobin 10/04/2016 16.1  13.0 - 17.0 g/dL Final  . HCT 10/04/2016 48.8  38.5 - 50.0 % Final  . MCV  10/04/2016 87.6  80.0 - 100.0 fL Final  . MCH 10/04/2016 28.9  27.0 - 33.0 pg Final  . MCHC 10/04/2016 33.0  32.0 - 36.0 g/dL Final  . RDW 10/04/2016 15.0  11.0 - 15.0 % Final  . Platelets 10/04/2016 240  140 - 400 K/uL Final  . MPV 10/04/2016 10.3  7.5 - 12.5 fL Final  . Neutro Abs 10/04/2016 5734  1,500 - 7,800 cells/uL Final  . Lymphs Abs 10/04/2016 2632  850 - 3,900 cells/uL Final  . Monocytes Absolute 10/04/2016 752  200 - 950 cells/uL Final  . Eosinophils Absolute 10/04/2016 282  15 - 500 cells/uL Final  . Basophils Absolute 10/04/2016 0  0 - 200 cells/uL Final  . Neutrophils Relative % 10/04/2016 61  % Final  . Lymphocytes Relative 10/04/2016 28  % Final  . Monocytes Relative 10/04/2016 8  % Final  . Eosinophils Relative 10/04/2016 3  % Final  .  Basophils Relative 10/04/2016 0  % Final  . Smear Review 10/04/2016 Criteria for review not met   Final  . Sodium 10/04/2016 140  135 - 146 mmol/L Final  . Potassium 10/04/2016 3.3* 3.5 - 5.3 mmol/L Final  . Chloride 10/04/2016 103  98 - 110 mmol/L Final  . CO2 10/04/2016 24  20 - 31 mmol/L Final  . Glucose, Bld 10/04/2016 123* 70 - 99 mg/dL Final  . BUN 10/04/2016 23  7 - 25 mg/dL Final  . Creat 10/04/2016 1.11  0.70 - 1.33 mg/dL Final   Comment:   For patients > or = 53 years of age: The upper reference limit for Creatinine is approximately 13% higher for people identified as African-American.     . Total Bilirubin 10/04/2016 0.5  0.2 - 1.2 mg/dL Final  . Alkaline Phosphatase 10/04/2016 55  40 - 115 U/L Final  . AST 10/04/2016 31  10 - 35 U/L Final  . ALT 10/04/2016 70* 9 - 46 U/L Final  . Total Protein 10/04/2016 6.6  6.1 - 8.1 g/dL Final  . Albumin 10/04/2016 4.1  3.6 - 5.1 g/dL Final  . Calcium 10/04/2016 9.2  8.6 - 10.3 mg/dL Final  . Hgb A1c MFr Bld 10/04/2016 7.0* <5.7 % Final   Comment:   For someone without known diabetes, a hemoglobin A1c value of 6.5% or greater indicates that they may have diabetes and this should  be confirmed with a follow-up test.   For someone with known diabetes, a value <7% indicates that their diabetes is well controlled and a value greater than or equal to 7% indicates suboptimal control. A1c targets should be individualized based on duration of diabetes, age, comorbid conditions, and other considerations.   Currently, no consensus exists for use of hemoglobin A1c for diagnosis of diabetes for children.     . Mean Plasma Glucose 10/04/2016 154  mg/dL Final  . Cholesterol 10/04/2016 125  <200 mg/dL Final  . Triglycerides 10/04/2016 180* <150 mg/dL Final  . HDL 10/04/2016 32* >40 mg/dL Final  . Total CHOL/HDL Ratio 10/04/2016 3.9  <5.0 Ratio Final  . VLDL 10/04/2016 36* <30 mg/dL Final  . LDL Cholesterol 10/04/2016 57  <100 mg/dL Final    Past Medical History:  Diagnosis Date  . Allergy   . Hyperlipidemia   . Hypertension    Past Surgical History:  Procedure Laterality Date  . VASECTOMY  2006   Current Outpatient Prescriptions on File Prior to Visit  Medication Sig Dispense Refill  . amLODipine (NORVASC) 10 MG tablet TAKE 1 TABLET BY MOUTH EVERY DAY 90 tablet 3  . aspirin 81 MG tablet Take 81 mg by mouth daily.    Marland Kitchen atorvastatin (LIPITOR) 80 MG tablet TAKE 1 TABLET BY MOUTH AT BEDTIME (Patient taking differently: TAKE 1/2 TABLET BY MOUTH AT BEDTIME) 90 tablet 1  . blood glucose meter kit and supplies KIT Dispense based on patient and insurance preference. Check fasting blood sugar each morning.  E11.65 1 each 0  . empagliflozin (JARDIANCE) 25 MG TABS tablet Take 25 mg by mouth daily. 90 tablet 1  . fluticasone (FLONASE) 50 MCG/ACT nasal spray Place 2 sprays into both nostrils daily. 48 g 11  . Glucosamine 500 MG CAPS Take 1 capsule by mouth daily.    . Lancets 30G MISC 1 each by Does not apply route daily. 100 each 3  . lisinopril-hydrochlorothiazide (PRINZIDE,ZESTORETIC) 20-12.5 MG tablet TAKE 2 TABLETS BY MOUTH DAILY 180 tablet 3  . loratadine (  CLARITIN) 10 MG  tablet Take 10 mg by mouth daily as needed for allergies.    . Lysine 500 MG CAPS Take 1 capsule by mouth daily.    . metFORMIN (GLUCOPHAGE) 1000 MG tablet TAKE 1 TABLET BY MOUTH TWICE A DAY WITH A MEAL 180 tablet 1  . Multiple Vitamins-Minerals (MULTIVITAMIN PO) Take by mouth.    . ONE TOUCH ULTRA TEST test strip CHECK FASTING BLOOD SUGAR EACH MORNING 100 each 3   No current facility-administered medications on file prior to visit.    No Known Allergies Social History   Social History  . Marital status: Married    Spouse name: N/A  . Number of children: N/A  . Years of education: N/A   Occupational History  . Not on file.   Social History Main Topics  . Smoking status: Never Smoker  . Smokeless tobacco: Never Used  . Alcohol use No  . Drug use: No  . Sexual activity: Yes     Comment: married to [place].  Three kids.  exercising daily.   Other Topics Concern  . Not on file   Social History Narrative  . No narrative on file   Family History  Problem Relation Age of Onset  . Heart disease Mother   . Heart disease Father   . Stroke Father   . Hypertension Sister   . Colon cancer Neg Hx       Review of Systems  Respiratory: Negative for apnea, cough, choking, chest tightness, shortness of breath and wheezing.   Cardiovascular: Negative for chest pain, palpitations and leg swelling.  Gastrointestinal: Negative for abdominal distention, abdominal pain and diarrhea.  Genitourinary: Negative for difficulty urinating, dysuria, frequency and hematuria.  All other systems reviewed and are negative.      Objective:   Physical Exam  Constitutional: He is oriented to person, place, and time. He appears well-developed and well-nourished.  HENT:  Head: Normocephalic and atraumatic.  Right Ear: External ear normal.  Left Ear: External ear normal.  Nose: Nose normal.  Mouth/Throat: Oropharynx is clear and moist. No oropharyngeal exudate.  Eyes: Conjunctivae and EOM are  normal. Pupils are equal, round, and reactive to light. Right eye exhibits no discharge. Left eye exhibits no discharge. No scleral icterus.  Neck: Normal range of motion. Neck supple. No JVD present. No tracheal deviation present. No thyromegaly present.  Cardiovascular: Normal rate, regular rhythm, normal heart sounds and intact distal pulses.  Exam reveals no gallop and no friction rub.   No murmur heard. Pulmonary/Chest: Effort normal and breath sounds normal. No stridor. No respiratory distress. He has no wheezes. He has no rales. He exhibits no tenderness.  Abdominal: Soft. Bowel sounds are normal. He exhibits no distension and no mass. There is no tenderness. There is no rebound and no guarding.  Musculoskeletal: Normal range of motion. He exhibits no edema or tenderness.  Lymphadenopathy:    He has no cervical adenopathy.  Neurological: He is alert and oriented to person, place, and time. He has normal reflexes. No cranial nerve deficit. He exhibits normal muscle tone. Coordination normal.  Skin: Skin is warm. No rash noted. He is not diaphoretic. No erythema. No pallor.  Psychiatric: He has a normal mood and affect. His behavior is normal. Judgment and thought content normal.  Vitals reviewed.         Assessment & Plan:  Routine general medical examination at a health care facility  Pure hypercholesterolemia  Essential hypertension  Controlled type  2 diabetes mellitus without complication, without long-term current use of insulin ([place])  I reviewed the lab work with the patient. Triglycerides are still too high. Mild elevation in liver function tests suggesting fatty liver disease. A1c is not completely at goal. Blood pressures borderline high. I recommended 20 pounds of weight loss through rigorous diet and lifestyle changes. Recheck in 6 months. Otherwise regular anticipatory guidance is provided. PSA is excellent. Colonoscopy was performed in 2014. Reassess in 6 months.  Diabetic foot exam is performed today and is normal

## 2016-10-19 ENCOUNTER — Other Ambulatory Visit: Payer: Self-pay | Admitting: Family Medicine

## 2016-10-19 DIAGNOSIS — E1165 Type 2 diabetes mellitus with hyperglycemia: Secondary | ICD-10-CM

## 2016-12-13 ENCOUNTER — Other Ambulatory Visit: Payer: Self-pay | Admitting: Family Medicine

## 2017-02-06 LAB — HM DIABETES EYE EXAM

## 2017-02-13 ENCOUNTER — Encounter: Payer: Self-pay | Admitting: Family Medicine

## 2017-03-07 ENCOUNTER — Ambulatory Visit (INDEPENDENT_AMBULATORY_CARE_PROVIDER_SITE_OTHER): Payer: BLUE CROSS/BLUE SHIELD | Admitting: Family Medicine

## 2017-03-07 DIAGNOSIS — Z23 Encounter for immunization: Secondary | ICD-10-CM

## 2017-04-01 ENCOUNTER — Other Ambulatory Visit: Payer: Self-pay | Admitting: Family Medicine

## 2017-04-11 ENCOUNTER — Other Ambulatory Visit: Payer: Self-pay | Admitting: Family Medicine

## 2017-04-11 DIAGNOSIS — E1165 Type 2 diabetes mellitus with hyperglycemia: Secondary | ICD-10-CM

## 2017-06-19 ENCOUNTER — Other Ambulatory Visit: Payer: Self-pay | Admitting: Family Medicine

## 2017-06-19 DIAGNOSIS — E1165 Type 2 diabetes mellitus with hyperglycemia: Secondary | ICD-10-CM

## 2017-07-09 DIAGNOSIS — Z85828 Personal history of other malignant neoplasm of skin: Secondary | ICD-10-CM | POA: Diagnosis not present

## 2017-07-09 DIAGNOSIS — L570 Actinic keratosis: Secondary | ICD-10-CM | POA: Diagnosis not present

## 2017-07-09 DIAGNOSIS — D485 Neoplasm of uncertain behavior of skin: Secondary | ICD-10-CM | POA: Diagnosis not present

## 2017-07-09 DIAGNOSIS — L814 Other melanin hyperpigmentation: Secondary | ICD-10-CM | POA: Diagnosis not present

## 2017-07-09 DIAGNOSIS — D225 Melanocytic nevi of trunk: Secondary | ICD-10-CM | POA: Diagnosis not present

## 2017-09-01 ENCOUNTER — Other Ambulatory Visit: Payer: Self-pay | Admitting: Family Medicine

## 2017-09-09 ENCOUNTER — Other Ambulatory Visit: Payer: Self-pay | Admitting: Family Medicine

## 2017-09-09 DIAGNOSIS — E1165 Type 2 diabetes mellitus with hyperglycemia: Secondary | ICD-10-CM

## 2017-09-21 ENCOUNTER — Other Ambulatory Visit: Payer: Self-pay | Admitting: Family Medicine

## 2017-10-02 ENCOUNTER — Other Ambulatory Visit: Payer: Self-pay | Admitting: Family Medicine

## 2017-10-02 DIAGNOSIS — Z125 Encounter for screening for malignant neoplasm of prostate: Secondary | ICD-10-CM

## 2017-10-02 DIAGNOSIS — Z0000 Encounter for general adult medical examination without abnormal findings: Secondary | ICD-10-CM

## 2017-10-02 DIAGNOSIS — E7800 Pure hypercholesterolemia, unspecified: Secondary | ICD-10-CM

## 2017-10-02 DIAGNOSIS — Z79899 Other long term (current) drug therapy: Secondary | ICD-10-CM

## 2017-10-02 DIAGNOSIS — I10 Essential (primary) hypertension: Secondary | ICD-10-CM

## 2017-10-02 DIAGNOSIS — E119 Type 2 diabetes mellitus without complications: Secondary | ICD-10-CM

## 2017-10-04 ENCOUNTER — Other Ambulatory Visit: Payer: Self-pay | Admitting: Surgery

## 2017-10-04 DIAGNOSIS — K429 Umbilical hernia without obstruction or gangrene: Secondary | ICD-10-CM | POA: Diagnosis not present

## 2017-10-10 ENCOUNTER — Encounter (HOSPITAL_COMMUNITY): Payer: Self-pay | Admitting: *Deleted

## 2017-10-12 ENCOUNTER — Other Ambulatory Visit: Payer: BLUE CROSS/BLUE SHIELD

## 2017-10-16 ENCOUNTER — Other Ambulatory Visit: Payer: BLUE CROSS/BLUE SHIELD

## 2017-10-16 DIAGNOSIS — E119 Type 2 diabetes mellitus without complications: Secondary | ICD-10-CM | POA: Diagnosis not present

## 2017-10-16 DIAGNOSIS — Z79899 Other long term (current) drug therapy: Secondary | ICD-10-CM

## 2017-10-16 DIAGNOSIS — Z125 Encounter for screening for malignant neoplasm of prostate: Secondary | ICD-10-CM | POA: Diagnosis not present

## 2017-10-16 DIAGNOSIS — I10 Essential (primary) hypertension: Secondary | ICD-10-CM

## 2017-10-16 DIAGNOSIS — E7800 Pure hypercholesterolemia, unspecified: Secondary | ICD-10-CM | POA: Diagnosis not present

## 2017-10-16 DIAGNOSIS — Z0000 Encounter for general adult medical examination without abnormal findings: Secondary | ICD-10-CM

## 2017-10-19 ENCOUNTER — Ambulatory Visit (INDEPENDENT_AMBULATORY_CARE_PROVIDER_SITE_OTHER): Payer: BLUE CROSS/BLUE SHIELD | Admitting: Family Medicine

## 2017-10-19 ENCOUNTER — Encounter: Payer: Self-pay | Admitting: Family Medicine

## 2017-10-19 VITALS — BP 132/84 | HR 80 | Temp 97.9°F | Resp 16 | Ht 69.0 in | Wt 226.0 lb

## 2017-10-19 DIAGNOSIS — Z23 Encounter for immunization: Secondary | ICD-10-CM

## 2017-10-19 DIAGNOSIS — E1165 Type 2 diabetes mellitus with hyperglycemia: Secondary | ICD-10-CM | POA: Diagnosis not present

## 2017-10-19 DIAGNOSIS — I10 Essential (primary) hypertension: Secondary | ICD-10-CM | POA: Diagnosis not present

## 2017-10-19 DIAGNOSIS — E7800 Pure hypercholesterolemia, unspecified: Secondary | ICD-10-CM

## 2017-10-19 DIAGNOSIS — Z0000 Encounter for general adult medical examination without abnormal findings: Secondary | ICD-10-CM

## 2017-10-19 NOTE — Progress Notes (Signed)
Subjective:    Patient ID: Joseph [name], male    DOB: May 02, 1960, 57 y.o.   MRN: [id number]  Medication Refill  Pertinent negatives include no abdominal pain, chest pain or coughing.  Patient has not been seen in over a year.  Since I last saw the patient, his umbilical hernia became more painful.  He has met with Dr. Ninfa Linden and they have scheduled an elective surgery to fix his umbilical hernia in July.  As a result he has been less active.  He is gained more weight.  And his blood sugars have worsened.  His most recent lab work is listed below.  He is due for Pneumovax 23.  He declines HIV screen.  His colonoscopy is up-to-date.  His diabetic foot exam was performed today and is normal. Appointment on 10/16/2017  Component Date Value Ref Range Status  . WBC 10/16/2017 8.9  3.8 - 10.8 Thousand/uL Final  . RBC 10/16/2017 6.00* 4.20 - 5.80 Million/uL Final  . Hemoglobin 10/16/2017 16.9  13.2 - 17.1 g/dL Final  . HCT 10/16/2017 49.6  38.5 - 50.0 % Final  . MCV 10/16/2017 82.7  80.0 - 100.0 fL Final  . MCH 10/16/2017 28.2  27.0 - 33.0 pg Final  . MCHC 10/16/2017 34.1  32.0 - 36.0 g/dL Final  . RDW 10/16/2017 14.5  11.0 - 15.0 % Final  . Platelets 10/16/2017 258  140 - 400 Thousand/uL Final  . MPV 10/16/2017 11.3  7.5 - 12.5 fL Final  . Neutro Abs 10/16/2017 5,055  1,500 - 7,800 cells/uL Final  . Lymphs Abs 10/16/2017 2,786  850 - 3,900 cells/uL Final  . WBC mixed population 10/16/2017 846  200 - 950 cells/uL Final  . Eosinophils Absolute 10/16/2017 134  15 - 500 cells/uL Final  . Basophils Absolute 10/16/2017 80  0 - 200 cells/uL Final  . Neutrophils Relative % 10/16/2017 56.8  % Final  . Total Lymphocyte 10/16/2017 31.3  % Final  . Monocytes Relative 10/16/2017 9.5  % Final  . Eosinophils Relative 10/16/2017 1.5  % Final  . Basophils Relative 10/16/2017 0.9  % Final  . Glucose, Bld 10/16/2017 149* 65 - 99 mg/dL Final   Comment: .            Fasting reference interval . For someone  without known diabetes, a glucose value >125 mg/dL indicates that they may have diabetes and this should be confirmed with a follow-up test. .   . BUN 10/16/2017 22  7 - 25 mg/dL Final  . Creat 10/16/2017 1.10  0.70 - 1.33 mg/dL Final   Comment: For patients >71 years of age, the reference limit for Creatinine is approximately 13% higher for people identified as African-American. .   Havery Moros Ratio 41/28/7867 NOT APPLICABLE  6 - 22 (calc) Final  . Sodium 10/16/2017 140  135 - 146 mmol/L Final  . Potassium 10/16/2017 3.6  3.5 - 5.3 mmol/L Final  . Chloride 10/16/2017 102  98 - 110 mmol/L Final  . CO2 10/16/2017 25  20 - 32 mmol/L Final  . Calcium 10/16/2017 9.4  8.6 - 10.3 mg/dL Final  . Total Protein 10/16/2017 7.1  6.1 - 8.1 g/dL Final  . Albumin 10/16/2017 4.5  3.6 - 5.1 g/dL Final  . Globulin 10/16/2017 2.6  1.9 - 3.7 g/dL (calc) Final  . AG Ratio 10/16/2017 1.7  1.0 - 2.5 (calc) Final  . Total Bilirubin 10/16/2017 0.6  0.2 - 1.2 mg/dL Final  . Alkaline phosphatase (  APISO) 10/16/2017 59  40 - 115 U/L Final  . AST 10/16/2017 33  10 - 35 U/L Final  . ALT 10/16/2017 73* 9 - 46 U/L Final  . Cholesterol 10/16/2017 145  <200 mg/dL Final  . HDL 10/16/2017 36* >40 mg/dL Final  . Triglycerides 10/16/2017 179* <150 mg/dL Final  . LDL Cholesterol (Calc) 10/16/2017 81  mg/dL (calc) Final   Comment: Reference range: <100 . Desirable range <100 mg/dL for primary prevention;   <70 mg/dL for patients with CHD or diabetic patients  with > or = 2 CHD risk factors. Marland Kitchen LDL-C is now calculated using the Martin-Hopkins  calculation, which is a validated novel method providing  better accuracy than the Friedewald equation in the  estimation of LDL-C.  Cresenciano Genre et al. Annamaria Helling. 3007;622(63): 2061-2068  (http://education.[facility].com/faq/FAQ164)   . Total CHOL/HDL Ratio 10/16/2017 4.0  <5.0 (calc) Final  . Non-HDL Cholesterol (Calc) 10/16/2017 109  <130 mg/dL (calc) Final    Comment: For patients with diabetes plus 1 major ASCVD risk  factor, treating to a non-HDL-C goal of <100 mg/dL  (LDL-C of <70 mg/dL) is considered a therapeutic  option.   Marland Kitchen PSA 10/16/2017 1.3  < OR = 4.0 ng/mL Final   Comment: The total PSA value from this assay system is  standardized against the WHO standard. The test  result will be approximately 20% lower when compared  to the equimolar-standardized total PSA (Beckman  Coulter). Comparison of serial PSA results should be  interpreted with this fact in mind. . This test was performed using the Siemens  chemiluminescent method. Values obtained from  different assay methods cannot be used interchangeably. PSA levels, regardless of value, should not be interpreted as absolute evidence of the presence or absence of disease.   . Hgb A1c MFr Bld 10/16/2017 7.9* <5.7 % of total Hgb Final   Comment: For someone without known diabetes, a hemoglobin A1c value of 6.5% or greater indicates that they may have  diabetes and this should be confirmed with a follow-up  test. . For someone with known diabetes, a value <7% indicates  that their diabetes is well controlled and a value  greater than or equal to 7% indicates suboptimal  control. A1c targets should be individualized based on  duration of diabetes, age, comorbid conditions, and  other considerations. . Currently, no consensus exists regarding use of hemoglobin A1c for diagnosis of diabetes for children. .   . Mean Plasma Glucose 10/16/2017 180  (calc) Final  . eAG (mmol/L) 10/16/2017 10.0  (calc) Final    Past Medical History:  Diagnosis Date  . Allergy   . Hyperlipidemia   . Hypertension    Past Surgical History:  Procedure Laterality Date  . VASECTOMY  2006   Current Outpatient Medications on File Prior to Visit  Medication Sig Dispense Refill  . amLODipine (NORVASC) 10 MG tablet TAKE 1 TABLET BY MOUTH EVERY DAY 90 tablet 3  . aspirin 81 MG tablet Take 81 mg by  mouth daily.    Marland Kitchen atorvastatin (LIPITOR) 80 MG tablet TAKE 1 TABLET BY MOUTH AT BEDTIME (Patient taking differently: TAKE 1/2 TABLET BY MOUTH AT BEDTIME) 90 tablet 1  . blood glucose meter kit and supplies KIT Dispense based on patient and insurance preference. Check fasting blood sugar each morning.  E11.65 1 each 0  . empagliflozin (JARDIANCE) 25 MG TABS tablet Take 25 mg by mouth daily. 90 tablet 1  . fluticasone (FLONASE) 50 MCG/ACT nasal spray Place  2 sprays into both nostrils daily. 48 g 11  . Glucosamine 500 MG CAPS Take 1 capsule by mouth daily.    Marland Kitchen JARDIANCE 25 MG TABS tablet TAKE 1 TABLET BY MOUTH DAILY 90 tablet 1  . Lancets 30G MISC 1 each by Does not apply route daily. 100 each 3  . lisinopril-hydrochlorothiazide (PRINZIDE,ZESTORETIC) 20-12.5 MG tablet TAKE 2 TABLETS BY MOUTH DAILY 180 tablet 3  . loratadine (CLARITIN) 10 MG tablet Take 10 mg by mouth daily as needed for allergies.    . Lysine 500 MG CAPS Take 1 capsule by mouth daily.    . metFORMIN (GLUCOPHAGE) 1000 MG tablet TAKE 1 TABLET BY MOUTH TWICE A DAY WITH A MEAL 180 tablet 0  . metFORMIN (GLUCOPHAGE) 1000 MG tablet TAKE 1 TABLET BY MOUTH TWICE A DAY WITH A MEAL 180 tablet 1  . Multiple Vitamins-Minerals (MULTIVITAMIN PO) Take by mouth.    . ONE TOUCH ULTRA TEST test strip CHECK FASTING BLOOD SUGAR EACH MORNING 100 each 3   No current facility-administered medications on file prior to visit.    No Known Allergies Social History   Socioeconomic History  . Marital status: Married    Spouse name: Not on file  . Number of children: Not on file  . Years of education: Not on file  . Highest education level: Not on file  Occupational History  . Not on file  Social Needs  . Financial resource strain: Not on file  . Food insecurity:    Worry: Not on file    Inability: Not on file  . Transportation needs:    Medical: Not on file    Non-medical: Not on file  Tobacco Use  . Smoking status: Never Smoker  . Smokeless  tobacco: Never Used  Substance and Sexual Activity  . Alcohol use: No  . Drug use: No  . Sexual activity: Yes    Comment: married to [place].  Three kids.  exercising daily.  Lifestyle  . Physical activity:    Days per week: Not on file    Minutes per session: Not on file  . Stress: Not on file  Relationships  . Social connections:    Talks on phone: Not on file    Gets together: Not on file    Attends religious service: Not on file    Active member of club or organization: Not on file    Attends meetings of clubs or organizations: Not on file    Relationship status: Not on file  . Intimate partner violence:    Fear of current or ex partner: Not on file    Emotionally abused: Not on file    Physically abused: Not on file    Forced sexual activity: Not on file  Other Topics Concern  . Not on file  Social History Narrative  . Not on file   Family History  Problem Relation Age of Onset  . Heart disease Father   . Stroke Father   . Hypertension Sister   . Heart disease Mother   . Colon cancer Neg Hx       Review of Systems  Respiratory: Negative for apnea, cough, choking, chest tightness, shortness of breath and wheezing.   Cardiovascular: Negative for chest pain, palpitations and leg swelling.  Gastrointestinal: Negative for abdominal distention, abdominal pain and diarrhea.  Genitourinary: Negative for difficulty urinating, dysuria, frequency and hematuria.  All other systems reviewed and are negative.      Objective:  Physical Exam  Constitutional: He is oriented to person, place, and time. He appears well-developed and well-nourished.  HENT:  Head: Normocephalic and atraumatic.  Right Ear: External ear normal.  Left Ear: External ear normal.  Nose: Nose normal.  Mouth/Throat: Oropharynx is clear and moist. No oropharyngeal exudate.  Eyes: Pupils are equal, round, and reactive to light. Conjunctivae and EOM are normal. Right eye exhibits no discharge. Left eye  exhibits no discharge. No scleral icterus.  Neck: Normal range of motion. Neck supple. No JVD present. No tracheal deviation present. No thyromegaly present.  Cardiovascular: Normal rate, regular rhythm, normal heart sounds and intact distal pulses. Exam reveals no gallop and no friction rub.  No murmur heard. Pulmonary/Chest: Effort normal and breath sounds normal. No stridor. No respiratory distress. He has no wheezes. He has no rales. He exhibits no tenderness.  Abdominal: Soft. Bowel sounds are normal. He exhibits no distension and no mass. There is no tenderness. There is no rebound and no guarding.  Musculoskeletal: Normal range of motion. He exhibits no edema or tenderness.  Lymphadenopathy:    He has no cervical adenopathy.  Neurological: He is alert and oriented to person, place, and time. He has normal reflexes. No cranial nerve deficit. He exhibits normal muscle tone. Coordination normal.  Skin: Skin is warm. No rash noted. He is not diaphoretic. No erythema. No pallor.  Psychiatric: He has a normal mood and affect. His behavior is normal. Judgment and thought content normal.  Vitals reviewed.         Assessment & Plan:  Routine general medical examination at a health care facility  Pure hypercholesterolemia  Essential hypertension  Uncontrolled type 2 diabetes mellitus with hyperglycemia, without long-term current use of insulin ([place])  Had a long discussion about therapeutic lifestyle changes to address his diabetes.  Add Trulicity 9.23 mg weekly to his regimen to try to help with weight loss and lower his blood sugars.  Increase aerobic exercise to 30 minutes a day 5 days a week and try to lose 10 to 15 pounds.  Recheck blood sugars and fasting lab work in 3 months.  Blood pressures well controlled.  Cholesterol is acceptable.  Patient received Pneumovax 23 today.  PSA is normal.  Colonoscopy is up-to-date.  Recommended annual diabetic eye exam

## 2017-10-19 NOTE — Addendum Note (Signed)
Addended by: Shary Decamp B on: 10/19/2017 10:19 AM   Modules accepted: Orders

## 2017-10-26 NOTE — Progress Notes (Signed)
10-16-17 HGA1C 7.9, CMP, CBC w/Diff

## 2017-10-26 NOTE — Patient Instructions (Addendum)
Joseph [name]  10/26/2017   Your procedure is scheduled on: 10-31-17   Report to [place]  Entrance    Report to Admitting at 8:00 AM    Call this number if you have problems the morning of surgery [phone]    Remember: NO SOLID FOOD AFTER MIDNIGHT THE NIGHT PRIOR TO SURGERY. NOTHING BY MOUTH EXCEPT CLEAR LIQUIDS UNTIL 3 HOURS PRIOR TO [place] SURGERY. PLEASE FINISH ENSURE DRINK PER SURGEON ORDER 3 HOURS PRIOR TO SCHEDULED SURGERY TIME WHICH NEEDS TO BE COMPLETED AT 7:00 AM.     CLEAR LIQUID DIET   Foods Allowed                                                                     Foods Excluded  Coffee and tea, regular and decaf                             liquids that you cannot  Plain Jell-O in any flavor                                             see through such as: Fruit ices (not with fruit pulp)                                     milk, soups, orange juice  Iced Popsicles                                    All solid food Carbonated beverages, regular and diet                                    Cranberry, grape and apple juices Sports drinks like Gatorade Lightly seasoned clear broth or consume(fat free) Sugar, honey syrup  Sample Menu Breakfast                                Lunch                                     Supper Cranberry juice                    Beef broth                            Chicken broth Jell-O                                     Grape juice  Apple juice Coffee or tea                        Jell-O                                      Popsicle                                                Coffee or tea                        Coffee or tea  _____________________________________________________________________    Take these medicines the morning of surgery with A SIP OF WATER: Amlodipine (Norvasc). You may bring and use your Flonase as needed                                You may not have  any metal on your body including hair pins and              piercings  Do not wear jewelry, lotions, powders or deodorant             Men may shave face and neck.   Do not bring valuables to the hospital. [place].  Contacts, dentures or bridgework may not be worn into surgery.  Leave suitcase in the car. After surgery it may be brought to your room.     Patients discharged the day of surgery will not be allowed to drive home.  Name and phone number of your driver: Marlane Hatcher [phone]                Please read over the following fact sheets you were given: _____________________________________________________________________ How to Manage Your Diabetes Before and After Surgery  Why is it important to control my blood sugar before and after surgery? . Improving blood sugar levels before and after surgery helps healing and can limit problems. . A way of improving blood sugar control is eating a healthy diet by: o  Eating less sugar and carbohydrates o  Increasing activity/exercise o  Talking with your doctor about reaching your blood sugar goals . High blood sugars (greater than 180 mg/dL) can raise your risk of infections and slow your recovery, so you will need to focus on controlling your diabetes during the weeks before surgery. . Make sure that the doctor who takes care of your diabetes knows about your planned surgery including the date and location.  How do I manage my blood sugar before surgery? . Check your blood sugar at least 4 times a day, starting 2 days before surgery, to make sure that the level is not too high or low. o Check your blood sugar the morning of your surgery when you wake up and every 2 hours until you get to the Short Stay unit. . If your blood sugar is less than 70 mg/dL, you will need to treat for low blood sugar: o Do not take insulin. o Treat a low blood sugar (less than 70 mg/dL) with  cup of  clear juice (cranberry or apple), 4 glucose tablets, OR glucose gel. o Recheck blood sugar in 15 minutes after treatment (to make sure it is greater than 70 mg/dL). If your blood sugar is not greater than 70 mg/dL on recheck, call [phone] for further instructions. . Report your blood sugar to the short stay nurse when you get to Short Stay.  . If you are admitted to the hospital after surgery: o Your blood sugar will be checked by the staff and you will probably be given insulin after surgery (instead of oral diabetes medicines) to make sure you have good blood sugar levels. o The goal for blood sugar control after surgery is 80-180 mg/dL.               [facility] - Preparing for Surgery Before surgery, you can play an important role.  Because skin is not sterile, your skin needs to be as free of germs as possible.  You can reduce the number of germs on your skin by washing with CHG (chlorahexidine gluconate) soap before surgery.  CHG is an antiseptic cleaner which kills germs and bonds with the skin to continue killing germs even after washing. Please DO NOT use if you have an allergy to CHG or antibacterial soaps.  If your skin becomes reddened/irritated stop using the CHG and inform your nurse when you arrive at Short Stay. Do not shave (including legs and underarms) for at least 48 hours prior to the first CHG shower.  You may shave your face/neck. Please follow these instructions carefully:  1.  Shower with CHG Soap the night before surgery and the  morning of Surgery.  2.  If you choose to wash your hair, wash your hair first as usual with your  normal  shampoo.  3.  After you shampoo, rinse your hair and body thoroughly to remove the  shampoo.                           4.  Use CHG as you would any other liquid soap.  You can apply chg directly  to the skin and wash                       Gently with a scrungie or clean washcloth.  5.  Apply the CHG Soap to your body ONLY FROM THE  NECK DOWN.   Do not use on face/ open                           Wound or open sores. Avoid contact with eyes, ears mouth and genitals (private parts).                       Wash face,  Genitals (private parts) with your normal soap.             6.  Wash thoroughly, paying special attention to the area where your surgery  will be performed.  7.  Thoroughly rinse your body with warm water from the neck down.  8.  DO NOT shower/wash with your normal soap after using and rinsing off  the CHG Soap.                9.  Pat yourself dry with a clean towel.            10.  Wear clean pajamas.  11.  Place clean sheets on your bed the night of your first shower and do not  sleep with pets. Day of Surgery : Do not apply any lotions/deodorants the morning of surgery.  Please wear clean clothes to the hospital/surgery center.  FAILURE TO FOLLOW THESE INSTRUCTIONS MAY RESULT IN THE CANCELLATION OF YOUR SURGERY PATIENT SIGNATURE_________________________________  NURSE SIGNATURE__________________________________  ________________________________________________________________________

## 2017-10-30 ENCOUNTER — Other Ambulatory Visit: Payer: Self-pay

## 2017-10-30 ENCOUNTER — Encounter (HOSPITAL_COMMUNITY)
Admission: RE | Admit: 2017-10-30 | Discharge: 2017-10-30 | Disposition: A | Discharge status: Home / Self Care | Payer: BLUE CROSS/BLUE SHIELD | Source: Ambulatory Visit | Attending: Surgery | Admitting: Surgery

## 2017-10-30 ENCOUNTER — Encounter (HOSPITAL_COMMUNITY): Payer: Self-pay

## 2017-10-30 DIAGNOSIS — E119 Type 2 diabetes mellitus without complications: Secondary | ICD-10-CM | POA: Insufficient documentation

## 2017-10-30 DIAGNOSIS — Z01818 Encounter for other preprocedural examination: Secondary | ICD-10-CM | POA: Diagnosis not present

## 2017-10-30 DIAGNOSIS — K429 Umbilical hernia without obstruction or gangrene: Secondary | ICD-10-CM | POA: Diagnosis not present

## 2017-10-30 DIAGNOSIS — I10 Essential (primary) hypertension: Secondary | ICD-10-CM | POA: Insufficient documentation

## 2017-10-30 HISTORY — DX: Type 2 diabetes mellitus without complications: E11.9

## 2017-10-30 LAB — GLUCOSE, CAPILLARY: Glucose-Capillary: 135 mg/dL — ABNORMAL HIGH (ref 70–99)

## 2017-10-30 NOTE — Anesthesia Preprocedure Evaluation (Addendum)
Anesthesia Evaluation  Patient identified by MRN, date of birth, ID band Patient awake    Reviewed: Allergy & Precautions, NPO status , Patient's Chart, lab work & pertinent test results  Airway Mallampati: II  TM Distance: >3 FB Neck ROM: Full    Dental no notable dental hx. (+) Teeth Intact, Dental Advisory Given   Pulmonary neg pulmonary ROS,    Pulmonary exam normal breath sounds clear to auscultation       Cardiovascular Exercise Tolerance: Good hypertension, Pt. on medications Normal cardiovascular exam Rhythm:Regular Rate:Normal     Neuro/Psych negative neurological ROS     GI/Hepatic negative GI ROS,   Endo/Other  diabetesMorbid obesity  Renal/GU      Musculoskeletal   Abdominal (+) + obese,   Peds  Hematology   Anesthesia Other Findings   Reproductive/Obstetrics                            Lab Results  Component Value Date   WBC 8.9 10/16/2017   HGB 16.9 10/16/2017   HCT 49.6 10/16/2017   MCV 82.7 10/16/2017   PLT 258 10/16/2017    Anesthesia Physical Anesthesia Plan  ASA: III  Anesthesia Plan: General   Post-op Pain Management:    Induction: Intravenous  PONV Risk Score and Plan: Treatment may vary due to age or medical condition, Dexamethasone and Ondansetron  Airway Management Planned: Oral ETT  Additional Equipment:   Intra-op Plan:   Post-operative Plan: Extubation in OR  Informed Consent: I have reviewed the patients History and Physical, chart, labs and discussed the procedure including the risks, benefits and alternatives for the proposed anesthesia with the patient or authorized representative who has indicated his/her understanding and acceptance.   Dental advisory given  Plan Discussed with: CRNA  Anesthesia Plan Comments:         Anesthesia Quick Evaluation

## 2017-10-30 NOTE — H&P (Signed)
Joseph [name] Mix Documented: 10/04/2017 9:41 AM Location: [facility] Patient #: [id number] DOB: 04-08-1961 Single / Language: Joseph [name] / Race: White Male   History of Present Illness Joseph [name] A. Ninfa [name]; 10/04/2017 9:57 AM) The patient is a 57 year old male who presents for an evaluation of a hernia. This pleasant gentleman is a self-referral for an umbilical hernia. He reports it has been present for a while but is now getting larger and symptomatic. He has pain at the umbilicus with physical activity which is described as sharp and occasionally moderate to severe. It will refer up to the ribs and down to the pelvis. He has no nausea, vomiting, or obstructive symptoms. He is currently pain-free today. He is voiding well and bowel movements are normal. He is otherwise healthy except for high blood pressure and diabetes   Past Surgical History Joseph Lorenzo, LPN; 08/02/345 4:25 AM) Oral Surgery  Vasectomy   Diagnostic Studies History Joseph Lorenzo, LPN; 01/03/6386 5:64 AM) Colonoscopy  1-5 years ago  Allergies Joseph Lorenzo, LPN; 07/01/2949 8:84 AM) No Known Allergies [10/04/2017]:  Medication History Joseph Lorenzo, LPN; 05/06/6061 0:16 AM) AmLODIPine Besylate (10MG  Tablet, Oral) Active. Atorvastatin Calcium (80MG  Tablet, Oral) Active. Jardiance (25MG  Tablet, Oral) Active. Lisinopril-Hydrochlorothiazide (20-12.5MG  Tablet, Oral) Active. MetFORMIN HCl (1000MG  Tablet, Oral) Active. Aspirin (81MG  Tablet DR, Oral) Active. Multivitamin Adult (Oral) Active. Lysine (500MG  Tablet, Oral) Active. Flonase (50MCG/ACT Suspension, Nasal) Active. Medications Reconciled  Social History Joseph Lorenzo, LPN; 0/05/930 3:55 AM) Alcohol use  Occasional alcohol use. Caffeine use  Carbonated beverages, Coffee, Tea. No drug use  Tobacco use  Never smoker.  Family History Joseph Lorenzo, LPN; 10/31/2200 5:42 AM) Arthritis  Mother. Cerebrovascular Accident   Father. Diabetes Mellitus  Mother. Heart Disease  Father, Mother. Heart disease in male family member before age 51  Hypertension  Father, Mother. Respiratory Condition  Mother. Thyroid problems  Mother.  Other Problems Joseph Lorenzo, LPN; 7/0/6237 6:28 AM) Diabetes Mellitus  High blood pressure  Hypercholesterolemia  Umbilical Hernia Repair     Review of Systems Joseph Billings Dockery LPN; 06/30/5174 1:60 AM) General Not Present- Appetite Loss, Chills, Fatigue, Fever, Night Sweats, Weight Gain and Weight Loss. Skin Not Present- Change in Wart/Mole, Dryness, Hives, Jaundice, New Lesions, Non-Healing Wounds, Rash and Ulcer. HEENT Present- Seasonal Allergies. Not Present- Earache, Hearing Loss, Hoarseness, Nose Bleed, Oral Ulcers, Ringing in the Ears, Sinus Pain, Sore Throat, Visual Disturbances, Wears glasses/contact lenses and Yellow Eyes. Respiratory Not Present- Bloody sputum, Chronic Cough, Difficulty Breathing, Snoring and Wheezing. Breast Not Present- Breast Mass, Breast Pain, Nipple Discharge and Skin Changes. Cardiovascular Not Present- Chest Pain, Difficulty Breathing Lying Down, Leg Cramps, Palpitations, Rapid Heart Rate, Shortness of Breath and Swelling of Extremities. Gastrointestinal Present- Abdominal Pain. Not Present- Bloating, Bloody Stool, Change in Bowel Habits, Chronic diarrhea, Constipation, Difficulty Swallowing, Excessive gas, Gets full quickly at meals, Hemorrhoids, Indigestion, Nausea, Rectal Pain and Vomiting. Male Genitourinary Not Present- Blood in Urine, Change in Urinary Stream, Frequency, Impotence, Nocturia, Painful Urination, Urgency and Urine Leakage. Musculoskeletal Not Present- Back Pain, Joint Pain, Joint Stiffness, Muscle Pain, Muscle Weakness and Swelling of Extremities. Neurological Not Present- Decreased Memory, Fainting, Headaches, Numbness, Seizures, Tingling, Tremor, Trouble walking and Weakness. Psychiatric Present- Change in Sleep Pattern.  Not Present- Anxiety, Bipolar, Depression, Fearful and Frequent crying. Endocrine Present- New Diabetes. Not Present- Cold Intolerance, Excessive Hunger, Hair Changes, Heat Intolerance and Hot flashes. Hematology Present- Easy Bruising. Not Present- Blood Thinners, Excessive bleeding, Gland problems, HIV and Persistent Infections.  Vitals (  Joseph Lorenzo LPN; 09/01/6268 3:50 AM) 10/04/2017 9:44 AM Weight: 227.8 lb Height: 68in Body Surface Area: 2.16 m Body Mass Index: 34.64 kg/m  Temp.: 98.44F(Temporal)  Pulse: 89 (Regular)  BP: 146/88 (Sitting, Left Arm, Standard)       Physical Exam ([name] A. Ninfa [name]; 10/04/2017 9:58 AM) General Mental Status-Alert. General Appearance-Consistent with stated age. Hydration-Well hydrated. Voice-Normal.  Head and Neck Head-normocephalic, atraumatic with no lesions or palpable masses. Trachea-midline.  Eye Eyeball - Bilateral-Extraocular movements intact. Sclera/Conjunctiva - Bilateral-No scleral icterus.  Chest and Lung Exam Chest and lung exam reveals -quiet, even and easy respiratory effort with no use of accessory muscles and on auscultation, normal breath sounds, no adventitious sounds and normal vocal resonance. Inspection Chest Wall - Normal. Back - normal.  Cardiovascular Cardiovascular examination reveals -normal heart sounds, regular rate and rhythm with no murmurs and normal pedal pulses bilaterally.  Abdomen Inspection Skin - Scar - no surgical scars. Hernias - Umbilical hernia - Reducible. Note: This is a moderate size umbilical hernia which is tender but reducible. Palpation/Percussion Palpation and Percussion of the abdomen reveal - Soft, Non Tender, No Rebound tenderness, No Rigidity (guarding) and No hepatosplenomegaly. Auscultation Auscultation of the abdomen reveals - Bowel sounds normal.  Neurologic - Did not examine.  Musculoskeletal - Did not examine.    Assessment & Plan  ([name] A. Ninfa [name]; 0/12/3816 2:99 AM) UMBILICAL HERNIA (B71.6) Impression: I discussed the diagnosis of hernia with the patient in detail. We discussed several different surgical options with umbilical hernia with mesh. We discussed that the laparoscopic and open techniques. He is a candidate for either technique. I believe the fascial defect is small to do this as an open repair as an outpatient. I discussed that procedure with him in detail. I discussed the risks which includes but is not limited to bleeding, infection, injury to surrounding structures, hernia recurrence, cardiopulmonary she is, DVT, postoperative recovery, etc. He understands and wishes to proceed with an open umbilical hernia repair with mesh which will be scheduled at his convenience. We also did discuss the signs and symptoms of incarceration. He will call if he has any issues preoperatively Current Plans Started OxyCODONE HCl 5 MG Oral Tablet, 1-2 Tablet every six hours, as needed, #25, 10/04/2017, No Refill.

## 2017-10-31 ENCOUNTER — Ambulatory Visit (HOSPITAL_COMMUNITY)
Admission: RE | Admit: 2017-10-31 | Discharge: 2017-10-31 | Disposition: A | Discharge status: Home / Self Care | Payer: BLUE CROSS/BLUE SHIELD | Source: Ambulatory Visit | Attending: Surgery | Admitting: Surgery

## 2017-10-31 ENCOUNTER — Encounter (HOSPITAL_COMMUNITY): Payer: Self-pay | Admitting: Emergency Medicine

## 2017-10-31 ENCOUNTER — Ambulatory Visit (HOSPITAL_COMMUNITY): Payer: BLUE CROSS/BLUE SHIELD | Admitting: Anesthesiology

## 2017-10-31 ENCOUNTER — Encounter (HOSPITAL_COMMUNITY)
Admission: RE | Disposition: A | Discharge status: Home / Self Care | Payer: Self-pay | Source: Ambulatory Visit | Attending: Surgery

## 2017-10-31 DIAGNOSIS — E7800 Pure hypercholesterolemia, unspecified: Secondary | ICD-10-CM | POA: Insufficient documentation

## 2017-10-31 DIAGNOSIS — Z7984 Long term (current) use of oral hypoglycemic drugs: Secondary | ICD-10-CM | POA: Insufficient documentation

## 2017-10-31 DIAGNOSIS — Z79899 Other long term (current) drug therapy: Secondary | ICD-10-CM | POA: Diagnosis not present

## 2017-10-31 DIAGNOSIS — E119 Type 2 diabetes mellitus without complications: Secondary | ICD-10-CM | POA: Insufficient documentation

## 2017-10-31 DIAGNOSIS — I10 Essential (primary) hypertension: Secondary | ICD-10-CM | POA: Diagnosis not present

## 2017-10-31 DIAGNOSIS — E785 Hyperlipidemia, unspecified: Secondary | ICD-10-CM | POA: Diagnosis not present

## 2017-10-31 DIAGNOSIS — Z6834 Body mass index (BMI) 34.0-34.9, adult: Secondary | ICD-10-CM | POA: Insufficient documentation

## 2017-10-31 DIAGNOSIS — Z7982 Long term (current) use of aspirin: Secondary | ICD-10-CM | POA: Diagnosis not present

## 2017-10-31 DIAGNOSIS — K429 Umbilical hernia without obstruction or gangrene: Secondary | ICD-10-CM | POA: Diagnosis not present

## 2017-10-31 DIAGNOSIS — Z8249 Family history of ischemic heart disease and other diseases of the circulatory system: Secondary | ICD-10-CM | POA: Diagnosis not present

## 2017-10-31 HISTORY — PX: INSERTION OF MESH: SHX5868

## 2017-10-31 HISTORY — PX: UMBILICAL HERNIA REPAIR: SHX196

## 2017-10-31 LAB — GLUCOSE, CAPILLARY
Glucose-Capillary: 212 mg/dL — ABNORMAL HIGH (ref 70–99)
Glucose-Capillary: 124 mg/dL — ABNORMAL HIGH (ref 70–99)

## 2017-10-31 SURGERY — REPAIR, HERNIA, UMBILICAL, ADULT
Anesthesia: General | Site: Abdomen

## 2017-10-31 MED ORDER — DEXAMETHASONE SODIUM PHOSPHATE 10 MG/ML IJ SOLN
INTRAMUSCULAR | Status: DC | PRN
  Administered 2017-10-31: 10 mg via INTRAVENOUS

## 2017-10-31 MED ORDER — PROPOFOL 10 MG/ML IV BOLUS
INTRAVENOUS | Status: DC | PRN
  Administered 2017-10-31: 170 mg via INTRAVENOUS

## 2017-10-31 MED ORDER — CELECOXIB 200 MG PO CAPS
200.0000 mg | ORAL_CAPSULE | ORAL | Status: AC
  Administered 2017-10-31: 200 mg via ORAL
  Filled 2017-10-31: qty 1

## 2017-10-31 MED ORDER — HYDROMORPHONE HCL 1 MG/ML IJ SOLN: 0.2500 mg | INTRAMUSCULAR | Status: DC | PRN

## 2017-10-31 MED ORDER — ROCURONIUM BROMIDE 100 MG/10ML IV SOLN
INTRAVENOUS | Status: AC
  Filled 2017-10-31: qty 1

## 2017-10-31 MED ORDER — 0.9 % SODIUM CHLORIDE (POUR BTL) OPTIME
TOPICAL | Status: DC | PRN
  Administered 2017-10-31: 1000 mL

## 2017-10-31 MED ORDER — CEFAZOLIN SODIUM-DEXTROSE 2-4 GM/100ML-% IV SOLN
2.0000 g | INTRAVENOUS | Status: AC
  Administered 2017-10-31: 2 g via INTRAVENOUS
  Filled 2017-10-31: qty 100

## 2017-10-31 MED ORDER — KETAMINE HCL 10 MG/ML IJ SOLN
INTRAMUSCULAR | Status: DC | PRN
  Administered 2017-10-31: 30 mg via INTRAVENOUS

## 2017-10-31 MED ORDER — ACETAMINOPHEN 500 MG PO TABS
1000.0000 mg | ORAL_TABLET | ORAL | Status: AC
  Administered 2017-10-31: 1000 mg via ORAL
  Filled 2017-10-31: qty 2

## 2017-10-31 MED ORDER — MEPERIDINE HCL 50 MG/ML IJ SOLN: 6.2500 mg | INTRAMUSCULAR | Status: DC | PRN

## 2017-10-31 MED ORDER — ONDANSETRON HCL 4 MG/2ML IJ SOLN
INTRAMUSCULAR | Status: DC | PRN
  Administered 2017-10-31: 4 mg via INTRAVENOUS

## 2017-10-31 MED ORDER — DEXAMETHASONE SODIUM PHOSPHATE 10 MG/ML IJ SOLN
INTRAMUSCULAR | Status: AC
  Filled 2017-10-31: qty 1

## 2017-10-31 MED ORDER — PROMETHAZINE HCL 25 MG/ML IJ SOLN: 6.2500 mg | INTRAMUSCULAR | Status: DC | PRN

## 2017-10-31 MED ORDER — BUPIVACAINE HCL (PF) 0.5 % IJ SOLN
INTRAMUSCULAR | Status: AC
  Filled 2017-10-31: qty 30

## 2017-10-31 MED ORDER — SUGAMMADEX SODIUM 500 MG/5ML IV SOLN
INTRAVENOUS | Status: AC
  Filled 2017-10-31: qty 5

## 2017-10-31 MED ORDER — ROCURONIUM BROMIDE 10 MG/ML (PF) SYRINGE
PREFILLED_SYRINGE | INTRAVENOUS | Status: DC | PRN
  Administered 2017-10-31: 50 mg via INTRAVENOUS

## 2017-10-31 MED ORDER — SUFENTANIL CITRATE 50 MCG/ML IV SOLN
INTRAVENOUS | Status: AC
  Filled 2017-10-31: qty 1

## 2017-10-31 MED ORDER — LIDOCAINE 20MG/ML (2%) 15 ML SYRINGE OPTIME
INTRAMUSCULAR | Status: DC | PRN
  Administered 2017-10-31: 1.5 mg/kg/h via INTRAVENOUS

## 2017-10-31 MED ORDER — PROPOFOL 10 MG/ML IV BOLUS
INTRAVENOUS | Status: AC
Start: 2017-10-31 — End: ?
  Filled 2017-10-31: qty 20

## 2017-10-31 MED ORDER — HYDROCODONE-ACETAMINOPHEN 7.5-325 MG PO TABS
1.0000 | ORAL_TABLET | Freq: Once | ORAL | Status: AC | PRN
  Administered 2017-10-31: 1 via ORAL

## 2017-10-31 MED ORDER — BUPIVACAINE HCL (PF) 0.5 % IJ SOLN
INTRAMUSCULAR | Status: DC | PRN
  Administered 2017-10-31: 20 mL

## 2017-10-31 MED ORDER — LACTATED RINGERS IV SOLN
INTRAVENOUS | Status: DC
Start: 2017-10-31 — End: 2017-10-31
  Administered 2017-10-31 (×2): via INTRAVENOUS

## 2017-10-31 MED ORDER — SUFENTANIL CITRATE 50 MCG/ML IV SOLN
INTRAVENOUS | Status: DC | PRN
  Administered 2017-10-31 (×2): 10 ug via INTRAVENOUS
  Administered 2017-10-31: 5 ug via INTRAVENOUS

## 2017-10-31 MED ORDER — ACETAMINOPHEN 10 MG/ML IV SOLN: 1000.0000 mg | Freq: Once | INTRAVENOUS | Status: DC | PRN

## 2017-10-31 MED ORDER — HYDROCODONE-ACETAMINOPHEN 7.5-325 MG PO TABS
ORAL_TABLET | ORAL | Status: AC
  Filled 2017-10-31: qty 1

## 2017-10-31 MED ORDER — GABAPENTIN 300 MG PO CAPS
300.0000 mg | ORAL_CAPSULE | ORAL | Status: AC
  Administered 2017-10-31: 300 mg via ORAL
  Filled 2017-10-31: qty 1

## 2017-10-31 MED ORDER — LIDOCAINE 2% (20 MG/ML) 5 ML SYRINGE
INTRAMUSCULAR | Status: AC
  Filled 2017-10-31: qty 15

## 2017-10-31 MED ORDER — SUGAMMADEX SODIUM 500 MG/5ML IV SOLN
INTRAVENOUS | Status: DC | PRN
  Administered 2017-10-31: 300 mg via INTRAVENOUS

## 2017-10-31 MED ORDER — OXYCODONE HCL 5 MG PO TABS: 5.0000 mg | ORAL_TABLET | Freq: Four times a day (QID) | ORAL | 0 refills | Status: DC | PRN

## 2017-10-31 MED ORDER — MIDAZOLAM HCL 5 MG/5ML IJ SOLN
INTRAMUSCULAR | Status: DC | PRN
  Administered 2017-10-31: 2 mg via INTRAVENOUS

## 2017-10-31 MED ORDER — MIDAZOLAM HCL 2 MG/2ML IJ SOLN
INTRAMUSCULAR | Status: AC
  Filled 2017-10-31: qty 2

## 2017-10-31 MED ORDER — LIDOCAINE 2% (20 MG/ML) 5 ML SYRINGE
INTRAMUSCULAR | Status: DC | PRN
  Administered 2017-10-31: 100 mg via INTRAVENOUS

## 2017-10-31 MED ORDER — CHLORHEXIDINE GLUCONATE CLOTH 2 % EX PADS: 6.0000 | MEDICATED_PAD | Freq: Once | CUTANEOUS | Status: DC

## 2017-10-31 MED ORDER — ONDANSETRON HCL 4 MG/2ML IJ SOLN
INTRAMUSCULAR | Status: AC
  Filled 2017-10-31: qty 2

## 2017-10-31 MED ORDER — KETAMINE HCL 10 MG/ML IJ SOLN
INTRAMUSCULAR | Status: AC
  Filled 2017-10-31: qty 1

## 2017-10-31 SURGICAL SUPPLY — 28 items
ADH SKN CLS APL DERMABOND .7 (GAUZE/BANDAGES/DRESSINGS) ×1
BINDER ABDOMINAL 12 ML 46-62 (SOFTGOODS) IMPLANT
BLADE SURG 15 STRL LF DISP TIS (BLADE) ×1 IMPLANT
BLADE SURG 15 STRL SS (BLADE) ×2
CHLORAPREP W/TINT 26ML (MISCELLANEOUS) ×2 IMPLANT
DECANTER SPIKE VIAL GLASS SM (MISCELLANEOUS) IMPLANT
DERMABOND ADVANCED (GAUZE/BANDAGES/DRESSINGS) ×1
DERMABOND ADVANCED .7 DNX12 (GAUZE/BANDAGES/DRESSINGS) ×1 IMPLANT
DRAPE LAPAROSCOPIC ABDOMINAL (DRAPES) ×2 IMPLANT
ELECT PENCIL ROCKER SW 15FT (MISCELLANEOUS) ×2 IMPLANT
ELECT REM PT RETURN 15FT ADLT (MISCELLANEOUS) ×2 IMPLANT
GAUZE SPONGE 4X4 12PLY STRL (GAUZE/BANDAGES/DRESSINGS) IMPLANT
GLOVE SURG SIGNA 7.5 PF LTX (GLOVE) ×2 IMPLANT
GOWN STRL REUS W/TWL XL LVL3 (GOWN DISPOSABLE) ×4 IMPLANT
KIT BASIN OR (CUSTOM PROCEDURE TRAY) ×2 IMPLANT
MESH VENTRALEX ST 1-7/10 CRC S (Mesh General) ×1 IMPLANT
NEEDLE HYPO 22GX1.5 SAFETY (NEEDLE) ×2 IMPLANT
PACK BASIC (CUSTOM PROCEDURE TRAY) ×2 IMPLANT
SPONGE LAP 18X18 RF (DISPOSABLE) ×2 IMPLANT
SUT MNCRL AB 4-0 PS2 18 (SUTURE) ×2 IMPLANT
SUT NOVA NAB DX-16 0-1 5-0 T12 (SUTURE) ×2 IMPLANT
SUT VIC AB 3-0 SH 27 (SUTURE) ×2
SUT VIC AB 3-0 SH 27X BRD (SUTURE) ×1 IMPLANT
SUT VICRYL 2 0 18  UND BR (SUTURE)
SUT VICRYL 2 0 18 UND BR (SUTURE) IMPLANT
SYR 20CC LL (SYRINGE) ×2 IMPLANT
TOWEL OR 17X26 10 PK STRL BLUE (TOWEL DISPOSABLE) ×2 IMPLANT
TOWEL OR NON WOVEN STRL DISP B (DISPOSABLE) ×2 IMPLANT

## 2017-10-31 NOTE — Transfer of Care (Signed)
Immediate Anesthesia Transfer of Care Note  Patient: Joseph [name]  Procedure(s) Performed: UMBILICAL HERNIA REPAIR WITH MESH (N/A Abdomen) INSERTION OF MESH (N/A Abdomen)  Patient Location: PACU  Anesthesia Type:General  Level of Consciousness: awake, alert , oriented and patient cooperative  Airway & Oxygen Therapy: Patient Spontanous Breathing and Patient connected to face mask oxygen  Post-op Assessment: Report given to RN, Post -op Vital signs reviewed and stable and Patient moving all extremities  Post vital signs: stable  Last Vitals:  Vitals Value Taken Time  BP 121/81 10/31/2017 11:00 AM  Temp 36.6 C 10/31/2017 10:56 AM  Pulse 63 10/31/2017 11:02 AM  Resp 10 10/31/2017 11:02 AM  SpO2 99 % 10/31/2017 11:02 AM  Vitals shown include unvalidated device data.  Last Pain:  Vitals:   10/31/17 1056  TempSrc:   PainSc: 0-No pain      Patients Stated Pain Goal: 4 (61/90/12 2241)  Complications: No apparent anesthesia complications

## 2017-10-31 NOTE — Interval H&P Note (Signed)
History and Physical Interval Note: no change in H and P  10/31/2017 9:18 AM  [name] Done  has presented today for surgery, with the diagnosis of UMBLICIAL HERNIA  The various methods of treatment have been discussed with the patient and family. After consideration of risks, benefits and other options for treatment, the patient has consented to  Procedure(s): UMBILICAL HERNIA REPAIR WITH MESH (N/A) INSERTION OF MESH (N/A) as a surgical intervention .  The patient's history has been reviewed, patient examined, no change in status, stable for surgery.  I have reviewed the patient's chart and labs.  Questions were answered to the patient's satisfaction.     Joseph [name] A

## 2017-10-31 NOTE — Op Note (Signed)
UMBILICAL HERNIA REPAIR WITH MESH, INSERTION OF MESH  Procedure Note  Joseph [name] 10/31/2017   Pre-op Diagnosis: UMBLICIAL HERNIA     Post-op Diagnosis: same  Procedure(s): UMBILICAL HERNIA REPAIR WITH MESH INSERTION OF MESH (4.3 cm round ventral patch)  Surgeon(s): Coralie Keens, MD  Anesthesia: General  Staff:  Circulator: Ignacia Palma, RN; House, [place] E, RN Scrub Person: Nathen May, CST  Estimated Blood Loss: Minimal      Procedure: The patient was brought to the operating room and identified as correct patient.  He was placed supine on the operating table and general anesthesia was induced.  His abdomen was then prepped and draped in usual sterile fashion.  I made [name] small transverse incision at the lower edge of the umbilicus with the scalpel.  I then carried this down to the small umbilical hernia defect.  I separated the sac from the overlying umbilical skin.  I then excised the sac and reduce the omentum back into the abdominal cavity.  The fascial defect was less than [name] centimeter in size.  I then brought [name] 4.3 cm round Prolene ventral patch from [place] onto the field.  It was placed through the fascial opening and pulled up against the peritoneum with the stay ties.  I then sutured the mesh in place circumferentially with #1 Novafil sutures.  I then cut the ties and closed the fascia over the top of the mesh with [name] figure-of-eight #1 Novafil suture.  Good closure of the fascial defect appeared to be achieved.  I then anesthetized the fascia and skin with Marcaine.  I then closed the subcutaneous tissue with interrupted 3-0 Vicryl sutures and closed the skin with [name] running 4-0 Monocryl.  Dermabond was then applied.  The patient tolerated the procedure well.  All the counts were correct at the end of the procedure.  The patient was then extubated in the operating room and taken in [name] stable condition to the recovery room.       Joseph [name]   Date: 10/31/2017  Time:  10:37 AM

## 2017-10-31 NOTE — Discharge Instructions (Signed)
[facility] _______[facility] ° °UMBILICAL OR INGUINAL HERNIA REPAIR: POST OP INSTRUCTIONS ° °Always review your discharge instruction sheet given to you by the facility where your surgery was performed. °IF YOU HAVE DISABILITY OR FAMILY LEAVE FORMS, YOU MUST BRING THEM TO THE OFFICE FOR PROCESSING.   °DO NOT GIVE THEM TO YOUR DOCTOR. ° °1. A  prescription for pain medication may be given to you upon discharge.  Take your pain medication as prescribed, if needed.  If narcotic pain medicine is not needed, then you may take acetaminophen (Tylenol) or ibuprofen (Advil) as needed. °2. Take your usually prescribed medications unless otherwise directed. °If you need a refill on your pain medication, please contact your pharmacy.  They will contact our office to request authorization. Prescriptions will not be filled after 5 pm or on week-ends. °3. You should follow a light diet the first 24 hours after arrival home, such as soup and crackers, etc.  Be sure to include lots of fluids daily.  Resume your normal diet the day after surgery. °4.Most patients will experience some swelling and bruising around the umbilicus or in the groin and scrotum.  Ice packs and reclining will help.  Swelling and bruising can take several days to resolve.  °6. It is common to experience some constipation if taking pain medication after surgery.  Increasing fluid intake and taking a stool softener (such as Colace) will usually help or prevent this problem from occurring.  A mild laxative (Milk of Magnesia or Miralax) should be taken according to package directions if there are no bowel movements after 48 hours. °7. Unless discharge instructions indicate otherwise, you may remove your bandages 24-48 hours after surgery, and you may shower at that time.  You may have steri-strips (small skin tapes) in place directly over the incision.  These strips should be left on the skin for 7-10 days.  If your surgeon used skin glue on the  incision, you may shower in 24 hours.  The glue will flake off over the next 2-3 weeks.  Any sutures or staples will be removed at the office during your follow-up visit. °8. ACTIVITIES:  You may resume regular (light) daily activities beginning the next day--such as daily self-care, walking, climbing stairs--gradually increasing activities as tolerated.  You may have sexual intercourse when it is comfortable.  Refrain from any heavy lifting or straining until approved by your doctor. ° °a.You may drive when you are no longer taking prescription pain medication, you can comfortably wear a seatbelt, and you can safely maneuver your car and apply brakes. °b.RETURN TO WORK:   °_____________________________________________ ° °9.You should see your doctor in the office for a follow-up appointment approximately 2-3 weeks after your surgery.  Make sure that you call for this appointment within a day or two after you arrive home to insure a convenient appointment time. °10.OTHER INSTRUCTIONS: __OK TO SHOWER STARTING TOMORROW °ICE PACK, TYLENOL, IBUPROFEN ALSO FOR PAIN °NO LIFTING MORE THAN 15 TO 20 POUNDS FOR 4 WEEKS_______________________ °   _____________________________________ ° °WHEN TO CALL YOUR DOCTOR: °1. Fever over 101.0 °2. Inability to urinate °3. Nausea and/or vomiting °4. Extreme swelling or bruising °5. Continued bleeding from incision. °6. Increased pain, redness, or drainage from the incision ° °The clinic staff is available to answer your questions during regular business hours.  Please don’t hesitate to call and ask to speak to one of the nurses for clinical concerns.  If you have a medical emergency, go to the   the nearest emergency room or call 911.  A surgeon from [facility] is always on call at the hospital   [address], [address], [place], [place]  [address] ?  [address] [address], , [place]   [address] [phone] ? [phone] ? FAX [phone] Web site:  [email or web address] 

## 2017-10-31 NOTE — Anesthesia Procedure Notes (Signed)
Procedure Name: Intubation Date/Time: 10/31/2017 10:07 AM Performed by: Lissa Morales, CRNA Pre-anesthesia Checklist: Patient identified, Emergency Drugs available, Suction available and Patient being monitored Patient Re-evaluated:Patient Re-evaluated prior to induction Oxygen Delivery Method: Circle system utilized Preoxygenation: Pre-oxygenation with 100% oxygen Induction Type: IV induction Ventilation: Mask ventilation without difficulty Laryngoscope Size: Mac and 3 Grade View: Grade I Tube type: Oral Number of attempts: 1 Airway Equipment and Method: Stylet and Oral airway Placement Confirmation: ETT inserted through vocal cords under direct vision,  positive ETCO2 and breath sounds checked- equal and bilateral Secured at: 21 cm Tube secured with: Tape Dental Injury: Teeth and Oropharynx as per pre-operative assessment

## 2017-10-31 NOTE — Progress Notes (Addendum)
Dr. Valma Cava notified that pt had powdered creamer in coffee this morning.  Pt started drinking his coffee at 0300 and finished at 0400. Dr. Valma Cava states pt will go to surgery at 1000.  Pt has not had solid food since 2300 last night. Had ensure, water and coffee this morning.

## 2017-10-31 NOTE — Anesthesia Postprocedure Evaluation (Signed)
Anesthesia Post Note  Patient: Joseph [name]  Procedure(s) Performed: UMBILICAL HERNIA REPAIR WITH MESH (N/A Abdomen) INSERTION OF MESH (N/A Abdomen)     Patient location during evaluation: PACU Anesthesia Type: General Level of consciousness: awake and alert Pain management: pain level controlled Vital Signs Assessment: post-procedure vital signs reviewed and stable Respiratory status: spontaneous breathing, nonlabored ventilation, respiratory function stable and patient connected to nasal cannula oxygen Cardiovascular status: blood pressure returned to baseline and stable Postop Assessment: no apparent nausea or vomiting Anesthetic complications: no    Last Vitals:  Vitals:   10/31/17 1140 10/31/17 1204  BP: (!) 148/83 131/82  Pulse: 86 64  Resp: 14 14  Temp: (!) 36.3 C   SpO2: 95% 95%    Last Pain:  Vitals:   10/31/17 1204  TempSrc:   PainSc: 3                  Barnet Glasgow

## 2017-11-02 ENCOUNTER — Encounter (HOSPITAL_COMMUNITY): Payer: Self-pay | Admitting: Surgery

## 2017-11-09 ENCOUNTER — Other Ambulatory Visit: Payer: Self-pay | Admitting: Family Medicine

## 2017-11-09 MED ORDER — DULAGLUTIDE 1.5 MG/0.5ML ~~LOC~~ SOAJ: 1.5000 mg | SUBCUTANEOUS | 2 refills | Status: DC

## 2017-11-12 ENCOUNTER — Other Ambulatory Visit: Payer: Self-pay | Admitting: Family Medicine

## 2017-11-12 MED ORDER — DULAGLUTIDE 0.75 MG/0.5ML ~~LOC~~ SOAJ: 0.7500 mg | SUBCUTANEOUS | 2 refills | Status: DC

## 2017-11-30 ENCOUNTER — Other Ambulatory Visit: Payer: Self-pay | Admitting: Family Medicine

## 2017-12-13 DIAGNOSIS — B078 Other viral warts: Secondary | ICD-10-CM | POA: Diagnosis not present

## 2017-12-13 DIAGNOSIS — L308 Other specified dermatitis: Secondary | ICD-10-CM | POA: Diagnosis not present

## 2017-12-27 ENCOUNTER — Encounter: Payer: Self-pay | Admitting: Family Medicine

## 2017-12-27 ENCOUNTER — Ambulatory Visit (INDEPENDENT_AMBULATORY_CARE_PROVIDER_SITE_OTHER): Payer: BLUE CROSS/BLUE SHIELD | Admitting: Family Medicine

## 2017-12-27 VITALS — BP 136/82 | HR 82 | Temp 98.3°F | Resp 18 | Ht 69.0 in | Wt 227.0 lb

## 2017-12-27 DIAGNOSIS — M7022 Olecranon bursitis, left elbow: Secondary | ICD-10-CM

## 2017-12-27 MED ORDER — PREDNISONE 20 MG PO TABS: ORAL_TABLET | ORAL | 0 refills | Status: DC

## 2017-12-27 MED ORDER — DOXYCYCLINE HYCLATE 100 MG PO TABS: 100.0000 mg | ORAL_TABLET | Freq: Two times a day (BID) | ORAL | 0 refills | Status: DC

## 2017-12-27 NOTE — Progress Notes (Signed)
Subjective:    Patient ID: Joseph [name], male    DOB: 09/28/1960, 57 y.o.   MRN: [id number]  HPI Patient states that a couple weeks ago, he noticed pain and swelling around his left elbow in the olecranon bursa.  Shortly thereafter the skin from his forearm up to his tricep became erythematous red and painful.  He saw his dermatologist who put him on doxycycline for cellulitis and the erythema totally subsided.  However he has a residual olecranon bursitis.  There is a trace minimal amount of fluid in the olecranon bursa.  It is palpable.  There is some mild tenderness in the left olecranon bursa.  There is no erythema.  There is no warmth.  He is taking ibuprofen occasionally to help him sleep due to the discomfort when he touches his elbow. Past Medical History:  Diagnosis Date  . Allergy   . Diabetes mellitus without complication ([place])   . Hyperlipidemia   . Hypertension    Past Surgical History:  Procedure Laterality Date  . COLONOSCOPY  2015  . INSERTION OF MESH N/A 10/31/2017   Procedure: INSERTION OF MESH;  Surgeon: Coralie Keens, MD;  Location: [facility];  Service: General;  Laterality: N/A;  . UMBILICAL HERNIA REPAIR N/A 10/31/2017   Procedure: UMBILICAL HERNIA REPAIR WITH MESH;  Surgeon: Coralie Keens, MD;  Location: [facility];  Service: General;  Laterality: N/A;  . VASECTOMY  2006   Current Outpatient Medications on File Prior to Visit  Medication Sig Dispense Refill  . amLODipine (NORVASC) 10 MG tablet TAKE 1 TABLET BY MOUTH EVERY DAY 90 tablet 3  . aspirin 81 MG tablet Take 81 mg by mouth daily.    Marland Kitchen atorvastatin (LIPITOR) 80 MG tablet TAKE 1 TABLET BY MOUTH AT BEDTIME (Patient taking differently: TAKE 1/2 TABLET BY MOUTH AT BEDTIME) 90 tablet 1  . blood glucose meter kit and supplies KIT Dispense based on patient and insurance preference. Check fasting blood sugar each morning.  E11.65 1 each 0  . Dulaglutide (TRULICITY) 0.24 OX/7.3ZH SOPN Inject 0.75 mg into the skin once  a week. 2 mL 2  . empagliflozin (JARDIANCE) 25 MG TABS tablet Take 25 mg by mouth daily. 90 tablet 1  . famotidine (PEPCID AC) 10 MG chewable tablet Chew 10 mg by mouth as needed for heartburn.    . fluticasone (FLONASE) 50 MCG/ACT nasal spray Place 2 sprays into both nostrils daily. 48 g 11  . Lancets 30G MISC 1 each by Does not apply route daily. 100 each 3  . lisinopril-hydrochlorothiazide (PRINZIDE,ZESTORETIC) 20-12.5 MG tablet TAKE 2 TABLETS BY MOUTH DAILY 180 tablet 3  . loratadine (CLARITIN) 10 MG tablet Take 10 mg by mouth daily as needed for allergies.    . Lysine 500 MG CAPS Take 1 capsule by mouth daily.    . metFORMIN (GLUCOPHAGE) 1000 MG tablet TAKE 1 TABLET BY MOUTH TWICE A DAY WITH A MEAL 180 tablet 1  . Multiple Vitamins-Minerals (MULTIVITAMIN PO) Take 1 tablet by mouth daily.     . ONE TOUCH ULTRA TEST test strip CHECK FASTING BLOOD SUGAR EACH MORNING 100 each 3  . triamcinolone cream (KENALOG) 0.1 % APPLY ON THE SKIN TWICE A DAY AS NEEDED  3   No current facility-administered medications on file prior to visit.    No Known Allergies Social History   Socioeconomic History  . Marital status: Married    Spouse name: Not on file  . Number of children: Not on file  .  Years of education: Not on file  . Highest education level: Not on file  Occupational History  . Not on file  Social Needs  . Financial resource strain: Not on file  . Food insecurity:    Worry: Not on file    Inability: Not on file  . Transportation needs:    Medical: Not on file    Non-medical: Not on file  Tobacco Use  . Smoking status: Never Smoker  . Smokeless tobacco: Never Used  Substance and Sexual Activity  . Alcohol use: No  . Drug use: No  . Sexual activity: Yes    Comment: married to [place].  Three kids.  exercising daily.  Lifestyle  . Physical activity:    Days per week: Not on file    Minutes per session: Not on file  . Stress: Not on file  Relationships  . Social connections:      Talks on phone: Not on file    Gets together: Not on file    Attends religious service: Not on file    Active member of club or organization: Not on file    Attends meetings of clubs or organizations: Not on file    Relationship status: Not on file  . Intimate partner violence:    Fear of current or ex partner: Not on file    Emotionally abused: Not on file    Physically abused: Not on file    Forced sexual activity: Not on file  Other Topics Concern  . Not on file  Social History Narrative  . Not on file      Review of Systems  All other systems reviewed and are negative.      Objective:   Physical Exam  Cardiovascular: Normal rate, regular rhythm and normal heart sounds.  Pulmonary/Chest: Effort normal and breath sounds normal. No stridor. No respiratory distress. He has no wheezes.  Musculoskeletal:       Left elbow: He exhibits swelling. He exhibits normal range of motion. Tenderness found. Olecranon process tenderness noted.  Vitals reviewed.         Assessment & Plan:  Olecranon bursitis of left elbow - Plan: predniSONE (DELTASONE) 20 MG tablet, doxycycline (VIBRA-TABS) 100 MG tablet  Patient definitely has bursitis in the left olecranon bursa.  The question is if this is an inflammatory bursitis versus an infectious bursitis particular given the relationship to the recent cellulitis.  Today it does not appear infected.  There is no erythema.  There is no warmth.  There is minimal fluid in the left olecranon bursa and there is some mild tenderness to palpation but he has normal range of motion in the elbow with full sub-pronation and pronation without pain.  We discussed the diagnosis and I recommended performing an aspiration of the bursa to send for a cell count and culture to rule out infection.  Patient agreed.  The elbow was prepped and draped in sterile fashion with Betadine.  The skin was anesthetized with 0.1% lidocaine without epinephrine.  An 18-gauge  needle was introduced into the olecranon bursa however I was unable to aspirate any bursa fluid just trace blood came through the needle.  After making the attempt, a pressure dressing was applied to the elbow and we discussed options.  I recommended a trial of oral prednisone versus a cortisone injection into the bursa.  I explained to the patient that if this is inflammatory, taking the prednisone and using a pressure dressing may help calm the  bursitis.  If it gets worse, obviously I would stop the prednisone due to the possibility of infection.  Patient agrees to this plan.  He insist on taking doxycycline.  I am willing to give the patient a prescription due to the recent cellulitis however I do not believe there is any infection today.  Given prednisone and dressing changes.  Reassess next week.  If no better, consult orthopedics.  If worsening discontinue prednisone immediately and consult orthopedics for possible septic bursitis.  However I believe this is most likely inflammatory bursitis on the clinical appearance, the lack of erythema, the lack of warmth, and the mild pain

## 2018-01-02 ENCOUNTER — Other Ambulatory Visit: Payer: Self-pay | Admitting: Family Medicine

## 2018-01-02 DIAGNOSIS — E1165 Type 2 diabetes mellitus with hyperglycemia: Secondary | ICD-10-CM

## 2018-01-02 DIAGNOSIS — I10 Essential (primary) hypertension: Secondary | ICD-10-CM

## 2018-01-02 DIAGNOSIS — Z79899 Other long term (current) drug therapy: Secondary | ICD-10-CM

## 2018-01-02 DIAGNOSIS — E785 Hyperlipidemia, unspecified: Secondary | ICD-10-CM

## 2018-01-07 ENCOUNTER — Telehealth: Payer: Self-pay | Admitting: Family Medicine

## 2018-01-07 MED ORDER — DICLOFENAC SODIUM 75 MG PO TBEC: 75.0000 mg | DELAYED_RELEASE_TABLET | Freq: Two times a day (BID) | ORAL | 2 refills | Status: DC

## 2018-01-07 NOTE — Telephone Encounter (Signed)
Recommend elbow sleeve and diclofenac 75 bid for 1-2 weeks to finish it off.

## 2018-01-07 NOTE — Telephone Encounter (Signed)
Patient aware of providers recommendations. Med sent to pharm.

## 2018-01-07 NOTE — Telephone Encounter (Signed)
Patient called [facility] stating that he elbow was 75% better and wanted to know if he needed another round of prednisone or what else would you like for him to do?

## 2018-01-15 ENCOUNTER — Other Ambulatory Visit: Payer: BLUE CROSS/BLUE SHIELD

## 2018-01-15 DIAGNOSIS — Z79899 Other long term (current) drug therapy: Secondary | ICD-10-CM | POA: Diagnosis not present

## 2018-01-15 DIAGNOSIS — E785 Hyperlipidemia, unspecified: Secondary | ICD-10-CM | POA: Diagnosis not present

## 2018-01-15 DIAGNOSIS — E1165 Type 2 diabetes mellitus with hyperglycemia: Secondary | ICD-10-CM

## 2018-01-15 DIAGNOSIS — I10 Essential (primary) hypertension: Secondary | ICD-10-CM

## 2018-01-16 LAB — COMPREHENSIVE METABOLIC PANEL
AG Ratio: 2.1 (calc) (ref 1.0–2.5)
ALBUMIN MSPROF: 4.4 g/dL (ref 3.6–5.1)
ALKALINE PHOSPHATASE (APISO): 48 U/L (ref 40–115)
ALT: 75 U/L — ABNORMAL HIGH (ref 9–46)
AST: 38 U/L — ABNORMAL HIGH (ref 10–35)
BUN: 22 mg/dL (ref 7–25)
CHLORIDE: 104 mmol/L (ref 98–110)
CO2: 23 mmol/L (ref 20–32)
Calcium: 9.3 mg/dL (ref 8.6–10.3)
Creat: 1.12 mg/dL (ref 0.70–1.33)
Globulin: 2.1 g/dL (calc) (ref 1.9–3.7)
Glucose, Bld: 123 mg/dL — ABNORMAL HIGH (ref 65–99)
Potassium: 3.5 mmol/L (ref 3.5–5.3)
Sodium: 141 mmol/L (ref 135–146)
Total Bilirubin: 0.5 mg/dL (ref 0.2–1.2)
Total Protein: 6.5 g/dL (ref 6.1–8.1)

## 2018-01-16 LAB — CBC WITH DIFFERENTIAL/PLATELET
BASOS PCT: 0.9 %
Basophils Absolute: 74 cells/uL (ref 0–200)
EOS ABS: 172 {cells}/uL (ref 15–500)
Eosinophils Relative: 2.1 %
HCT: 47.7 % (ref 38.5–50.0)
Hemoglobin: 16.3 g/dL (ref 13.2–17.1)
Lymphs Abs: 2731 cells/uL (ref 850–3900)
MCH: 28.9 pg (ref 27.0–33.0)
MCHC: 34.2 g/dL (ref 32.0–36.0)
MCV: 84.6 fL (ref 80.0–100.0)
MONOS PCT: 10 %
MPV: 11.3 fL (ref 7.5–12.5)
NEUTROS PCT: 53.7 %
Neutro Abs: 4403 cells/uL (ref 1500–7800)
PLATELETS: 233 10*3/uL (ref 140–400)
RBC: 5.64 10*6/uL (ref 4.20–5.80)
RDW: 14.7 % (ref 11.0–15.0)
TOTAL LYMPHOCYTE: 33.3 %
WBC mixed population: 820 cells/uL (ref 200–950)
WBC: 8.2 10*3/uL (ref 3.8–10.8)

## 2018-01-16 LAB — HEMOGLOBIN A1C
Hgb A1c MFr Bld: 7.5 % of total Hgb — ABNORMAL HIGH (ref <5.7)
MEAN PLASMA GLUCOSE: 169 (calc)
eAG (mmol/L): 9.3 (calc)

## 2018-01-16 LAB — LIPID PANEL
CHOL/HDL RATIO: 3.5 (calc) (ref <5.0)
Cholesterol: 125 mg/dL (ref <200)
HDL: 36 mg/dL — AB (ref >40)
LDL Cholesterol (Calc): 68 mg/dL (calc)
NON-HDL CHOLESTEROL (CALC): 89 mg/dL (ref <130)
Triglycerides: 133 mg/dL (ref <150)

## 2018-01-18 ENCOUNTER — Ambulatory Visit (INDEPENDENT_AMBULATORY_CARE_PROVIDER_SITE_OTHER): Payer: BLUE CROSS/BLUE SHIELD | Admitting: Family Medicine

## 2018-01-18 ENCOUNTER — Encounter: Payer: Self-pay | Admitting: Family Medicine

## 2018-01-18 VITALS — BP 130/80 | HR 70 | Temp 97.8°F | Resp 16 | Ht 69.0 in | Wt 224.0 lb

## 2018-01-18 DIAGNOSIS — Z23 Encounter for immunization: Secondary | ICD-10-CM

## 2018-01-18 DIAGNOSIS — E1165 Type 2 diabetes mellitus with hyperglycemia: Secondary | ICD-10-CM | POA: Diagnosis not present

## 2018-01-18 DIAGNOSIS — I10 Essential (primary) hypertension: Secondary | ICD-10-CM | POA: Diagnosis not present

## 2018-01-18 DIAGNOSIS — E785 Hyperlipidemia, unspecified: Secondary | ICD-10-CM

## 2018-01-18 NOTE — Addendum Note (Signed)
Addended by: Shary Decamp B on: 01/18/2018 10:43 AM   Modules accepted: Orders

## 2018-01-18 NOTE — Progress Notes (Signed)
Subjective:    Patient ID: Joseph [name], male    DOB: [birth year], 57 y.o.   MRN: [id number]  HPI  Here for follow up of his diabetes, hyperlipidemia, and hypertension.  Due for diabetic foot exam and flu shot.   He agrees to receive his flu shot today.  The bursitis in his left elbow has resolved approximately 95%.  There is no swelling or erythema today.  There is only mild residual pain.  I reviewed his fasting lab work with him which was significant for persistent elevation in his ALT as well as a hemoglobin A1c of 7.5.  Please see below in the objective section for the lab work.  He denies any polyuria, polydipsia, or blurry vision.  He denies any chest pain shortness of breath or dyspnea on exertion.  He is watching his diet and he has lost approximately 5 pounds.  However he admits to drinking sweet tea almost with every meal.  He is also not engaging in vigorous aerobic exercise.  He denies any myalgias on his statin.  Past Medical History:  Diagnosis Date  . Allergy   . Diabetes mellitus without complication ([place])   . Hyperlipidemia   . Hypertension    Past Surgical History:  Procedure Laterality Date  . COLONOSCOPY  2015  . INSERTION OF MESH N/A 10/31/2017   Procedure: INSERTION OF MESH;  Surgeon: Coralie Keens, MD;  Location: [facility];  Service: General;  Laterality: N/A;  . UMBILICAL HERNIA REPAIR N/A 10/31/2017   Procedure: UMBILICAL HERNIA REPAIR WITH MESH;  Surgeon: Coralie Keens, MD;  Location: [facility];  Service: General;  Laterality: N/A;  . VASECTOMY  2006   Current Outpatient Medications on File Prior to Visit  Medication Sig Dispense Refill  . amLODipine (NORVASC) 10 MG tablet TAKE 1 TABLET BY MOUTH EVERY DAY 90 tablet 3  . aspirin 81 MG tablet Take 81 mg by mouth daily.    Marland Kitchen atorvastatin (LIPITOR) 80 MG tablet TAKE 1 TABLET BY MOUTH AT BEDTIME (Patient taking differently: TAKE 1/2 TABLET BY MOUTH AT BEDTIME) 90 tablet 1  . blood glucose meter kit and supplies KIT  Dispense based on patient and insurance preference. Check fasting blood sugar each morning.  E11.65 1 each 0  . diclofenac (VOLTAREN) 75 MG EC tablet Take 1 tablet (75 mg total) by mouth 2 (two) times daily. 60 tablet 2  . doxycycline (VIBRA-TABS) 100 MG tablet Take 1 tablet (100 mg total) by mouth 2 (two) times daily. 20 tablet 0  . Dulaglutide (TRULICITY) 2.72 ZD/6.6YQ SOPN Inject 0.75 mg into the skin once a week. 2 mL 2  . empagliflozin (JARDIANCE) 25 MG TABS tablet Take 25 mg by mouth daily. 90 tablet 1  . famotidine (PEPCID AC) 10 MG chewable tablet Chew 10 mg by mouth as needed for heartburn.    . fluticasone (FLONASE) 50 MCG/ACT nasal spray Place 2 sprays into both nostrils daily. 48 g 11  . Lancets 30G MISC 1 each by Does not apply route daily. 100 each 3  . lisinopril-hydrochlorothiazide (PRINZIDE,ZESTORETIC) 20-12.5 MG tablet TAKE 2 TABLETS BY MOUTH DAILY 180 tablet 3  . loratadine (CLARITIN) 10 MG tablet Take 10 mg by mouth daily as needed for allergies.    . Lysine 500 MG CAPS Take 1 capsule by mouth daily.    . metFORMIN (GLUCOPHAGE) 1000 MG tablet TAKE 1 TABLET BY MOUTH TWICE A DAY WITH A MEAL 180 tablet 1  . Multiple Vitamins-Minerals (MULTIVITAMIN PO) Take 1  tablet by mouth daily.     . ONE TOUCH ULTRA TEST test strip CHECK FASTING BLOOD SUGAR EACH MORNING 100 each 3  . predniSONE (DELTASONE) 20 MG tablet 3 tabs poqday 1-2, 2 tabs poqday 3-4, 1 tab poqday 5-6 12 tablet 0  . triamcinolone cream (KENALOG) 0.1 % APPLY ON THE SKIN TWICE A DAY AS NEEDED  3   No current facility-administered medications on file prior to visit.     No Known Allergies Social History   Socioeconomic History  . Marital status: Married    Spouse name: Not on file  . Number of children: Not on file  . Years of education: Not on file  . Highest education level: Not on file  Occupational History  . Not on file  Social Needs  . Financial resource strain: Not on file  . Food insecurity:    Worry:  Not on file    Inability: Not on file  . Transportation needs:    Medical: Not on file    Non-medical: Not on file  Tobacco Use  . Smoking status: Never Smoker  . Smokeless tobacco: Never Used  Substance and Sexual Activity  . Alcohol use: No  . Drug use: No  . Sexual activity: Yes    Comment: married to [place].  Three kids.  exercising daily.  Lifestyle  . Physical activity:    Days per week: Not on file    Minutes per session: Not on file  . Stress: Not on file  Relationships  . Social connections:    Talks on phone: Not on file    Gets together: Not on file    Attends religious service: Not on file    Active member of club or organization: Not on file    Attends meetings of clubs or organizations: Not on file    Relationship status: Not on file  . Intimate partner violence:    Fear of current or ex partner: Not on file    Emotionally abused: Not on file    Physically abused: Not on file    Forced sexual activity: Not on file  Other Topics Concern  . Not on file  Social History Narrative  . Not on file     Review of Systems  All other systems reviewed and are negative.      Objective:   Physical Exam  Constitutional: He is oriented to person, place, and time. He appears well-developed and well-nourished.  Cardiovascular: Normal rate, regular rhythm, normal heart sounds and intact distal pulses. Exam reveals no gallop and no friction rub.  No murmur heard. Pulmonary/Chest: Effort normal and breath sounds normal. No stridor. No respiratory distress. He has no wheezes. He has no rales. He exhibits no tenderness.  Abdominal: Soft. Bowel sounds are normal. He exhibits no distension and no mass. There is no tenderness. There is no rebound and no guarding.  Musculoskeletal: He exhibits no edema.  Neurological: He is alert and oriented to person, place, and time. He displays normal reflexes. No cranial nerve deficit. Coordination normal.  Vitals reviewed.  Appointment  on 01/15/2018  Component Date Value Ref Range Status  . Hgb A1c MFr Bld 01/15/2018 7.5* <5.7 % of total Hgb Final   Comment: For someone without known diabetes, a hemoglobin A1c value of 6.5% or greater indicates that they may have  diabetes and this should be confirmed with a follow-up  test. . For someone with known diabetes, a value <7% indicates  that their  diabetes is well controlled and a value  greater than or equal to 7% indicates suboptimal  control. A1c targets should be individualized based on  duration of diabetes, age, comorbid conditions, and  other considerations. . Currently, no consensus exists regarding use of hemoglobin A1c for diagnosis of diabetes for children. .   . Mean Plasma Glucose 01/15/2018 169  (calc) Final  . eAG (mmol/L) 01/15/2018 9.3  (calc) Final  . Cholesterol 01/15/2018 125  <200 mg/dL Final  . HDL 01/15/2018 36* >40 mg/dL Final  . Triglycerides 01/15/2018 133  <150 mg/dL Final  . LDL Cholesterol (Calc) 01/15/2018 68  mg/dL (calc) Final   Comment: Reference range: <100 . Desirable range <100 mg/dL for primary prevention;   <70 mg/dL for patients with CHD or diabetic patients  with > or = 2 CHD risk factors. Marland Kitchen LDL-C is now calculated using the Martin-Hopkins  calculation, which is a validated novel method providing  better accuracy than the Friedewald equation in the  estimation of LDL-C.  Cresenciano Genre et al. Annamaria Helling. 9458;592(92): 2061-2068  (http://education.[facility].com/faq/FAQ164)   . Total CHOL/HDL Ratio 01/15/2018 3.5  <5.0 (calc) Final  . Non-HDL Cholesterol (Calc) 01/15/2018 89  <130 mg/dL (calc) Final   Comment: For patients with diabetes plus 1 major ASCVD risk  factor, treating to a non-HDL-C goal of <100 mg/dL  (LDL-C of <70 mg/dL) is considered a therapeutic  option.   . Glucose, Bld 01/15/2018 123* 65 - 99 mg/dL Final   Comment: .            Fasting reference interval . For someone without known diabetes, a glucose  value between 100 and 125 mg/dL is consistent with prediabetes and should be confirmed with a follow-up test. .   . BUN 01/15/2018 22  7 - 25 mg/dL Final  . Creat 01/15/2018 1.12  0.70 - 1.33 mg/dL Final   Comment: For patients >2 years of age, the reference limit for Creatinine is approximately 13% higher for people identified as African-American. .   Havery Moros Ratio 44/62/8638 NOT APPLICABLE  6 - 22 (calc) Final  . Sodium 01/15/2018 141  135 - 146 mmol/L Final  . Potassium 01/15/2018 3.5  3.5 - 5.3 mmol/L Final  . Chloride 01/15/2018 104  98 - 110 mmol/L Final  . CO2 01/15/2018 23  20 - 32 mmol/L Final  . Calcium 01/15/2018 9.3  8.6 - 10.3 mg/dL Final  . Total Protein 01/15/2018 6.5  6.1 - 8.1 g/dL Final  . Albumin 01/15/2018 4.4  3.6 - 5.1 g/dL Final  . Globulin 01/15/2018 2.1  1.9 - 3.7 g/dL (calc) Final  . AG Ratio 01/15/2018 2.1  1.0 - 2.5 (calc) Final  . Total Bilirubin 01/15/2018 0.5  0.2 - 1.2 mg/dL Final  . Alkaline phosphatase (APISO) 01/15/2018 48  40 - 115 U/L Final  . AST 01/15/2018 38* 10 - 35 U/L Final  . ALT 01/15/2018 75* 9 - 46 U/L Final  . WBC 01/15/2018 8.2  3.8 - 10.8 Thousand/uL Final  . RBC 01/15/2018 5.64  4.20 - 5.80 Million/uL Final  . Hemoglobin 01/15/2018 16.3  13.2 - 17.1 g/dL Final  . HCT 01/15/2018 47.7  38.5 - 50.0 % Final  . MCV 01/15/2018 84.6  80.0 - 100.0 fL Final  . MCH 01/15/2018 28.9  27.0 - 33.0 pg Final  . MCHC 01/15/2018 34.2  32.0 - 36.0 g/dL Final  . RDW 01/15/2018 14.7  11.0 - 15.0 % Final  . Platelets 01/15/2018 233  140 - 400 Thousand/uL Final  . MPV 01/15/2018 11.3  7.5 - 12.5 fL Final  . Neutro Abs 01/15/2018 4,403  1,500 - 7,800 cells/uL Final  . Lymphs Abs 01/15/2018 2,731  850 - 3,900 cells/uL Final  . WBC mixed population 01/15/2018 820  200 - 950 cells/uL Final  . Eosinophils Absolute 01/15/2018 172  15 - 500 cells/uL Final  . Basophils Absolute 01/15/2018 74  0 - 200 cells/uL Final  . Neutrophils Relative %  01/15/2018 53.7  % Final  . Total Lymphocyte 01/15/2018 33.3  % Final  . Monocytes Relative 01/15/2018 10.0  % Final  . Eosinophils Relative 01/15/2018 2.1  % Final  . Basophils Relative 01/15/2018 0.9  % Final           Assessment & Plan:  Essential hypertension  Uncontrolled type 2 diabetes mellitus with hyperglycemia, without long-term current use of insulin (HCC)  Hyperlipidemia, unspecified hyperlipidemia type  We discussed his options which included adding Actos to his medication or focusing on diet exercise and weight loss and trying to lose 10 to 20 pounds over the next 6 months as a means of correcting his nonalcoholic fatty liver disease as well as lowering his A1c to an ultimate goal of 6.5.  After long discussion, patient would like to try 6 months of aggressive lifestyle changes.  I explained to the patient that this necessitates that he just stops drinking sweet tea and soda and drinks only water and also picks up the intensity of his aerobic exercise to 30 minutes a day of vigorous aerobic exercise 5 days a week.  Patient received his flu shot and will recheck lab work in March.  Blood pressure and LDL cholesterol are well controlled.

## 2018-01-21 ENCOUNTER — Other Ambulatory Visit: Payer: Self-pay | Admitting: Family Medicine

## 2018-01-29 LAB — COMPREHENSIVE METABOLIC PANEL
AG RATIO: 1.7 (calc) (ref 1.0–2.5)
ALKALINE PHOSPHATASE (APISO): 59 U/L (ref 40–115)
ALT: 73 U/L — ABNORMAL HIGH (ref 9–46)
AST: 33 U/L (ref 10–35)
Albumin: 4.5 g/dL (ref 3.6–5.1)
BILIRUBIN TOTAL: 0.6 mg/dL (ref 0.2–1.2)
BUN: 22 mg/dL (ref 7–25)
CALCIUM: 9.4 mg/dL (ref 8.6–10.3)
CHLORIDE: 102 mmol/L (ref 98–110)
CO2: 25 mmol/L (ref 20–32)
Creat: 1.1 mg/dL (ref 0.70–1.33)
GLOBULIN: 2.6 g/dL (ref 1.9–3.7)
Glucose, Bld: 149 mg/dL — ABNORMAL HIGH (ref 65–99)
POTASSIUM: 3.6 mmol/L (ref 3.5–5.3)
Sodium: 140 mmol/L (ref 135–146)
Total Protein: 7.1 g/dL (ref 6.1–8.1)

## 2018-01-29 LAB — CBC WITH DIFFERENTIAL/PLATELET
BASOS PCT: 0.9 %
Basophils Absolute: 80 cells/uL (ref 0–200)
Eosinophils Absolute: 134 cells/uL (ref 15–500)
Eosinophils Relative: 1.5 %
HCT: 49.6 % (ref 38.5–50.0)
Hemoglobin: 16.9 g/dL (ref 13.2–17.1)
Lymphs Abs: 2786 cells/uL (ref 850–3900)
MCH: 28.2 pg (ref 27.0–33.0)
MCHC: 34.1 g/dL (ref 32.0–36.0)
MCV: 82.7 fL (ref 80.0–100.0)
MONOS PCT: 9.5 %
MPV: 11.3 fL (ref 7.5–12.5)
Neutro Abs: 5055 cells/uL (ref 1500–7800)
Neutrophils Relative %: 56.8 %
Platelets: 258 10*3/uL (ref 140–400)
RBC: 6 10*6/uL — AB (ref 4.20–5.80)
RDW: 14.5 % (ref 11.0–15.0)
Total Lymphocyte: 31.3 %
WBC mixed population: 846 cells/uL (ref 200–950)
WBC: 8.9 10*3/uL (ref 3.8–10.8)

## 2018-01-29 LAB — LIPID PANEL
CHOLESTEROL: 145 mg/dL (ref <200)
HDL: 36 mg/dL — ABNORMAL LOW (ref >40)
LDL Cholesterol (Calc): 81 mg/dL (calc)
Non-HDL Cholesterol (Calc): 109 mg/dL (calc) (ref <130)
Total CHOL/HDL Ratio: 4 (calc) (ref <5.0)
Triglycerides: 179 mg/dL — ABNORMAL HIGH (ref <150)

## 2018-01-29 LAB — HEMOGLOBIN A1C
EAG (MMOL/L): 10 (calc)
HEMOGLOBIN A1C: 7.9 %{Hb} — AB (ref <5.7)
Mean Plasma Glucose: 180 (calc)

## 2018-01-29 LAB — PSA: PSA: 1.3 ng/mL (ref <=4.0)

## 2018-02-27 LAB — HM DIABETES EYE EXAM

## 2018-03-02 ENCOUNTER — Other Ambulatory Visit: Payer: Self-pay | Admitting: Family Medicine

## 2018-03-04 ENCOUNTER — Encounter: Payer: Self-pay | Admitting: *Deleted

## 2018-03-16 ENCOUNTER — Other Ambulatory Visit: Payer: Self-pay | Admitting: Family Medicine

## 2018-03-25 DIAGNOSIS — L570 Actinic keratosis: Secondary | ICD-10-CM | POA: Diagnosis not present

## 2018-03-30 ENCOUNTER — Other Ambulatory Visit: Payer: Self-pay | Admitting: Family Medicine

## 2018-04-01 ENCOUNTER — Encounter: Payer: Self-pay | Admitting: Family Medicine

## 2018-04-02 MED ORDER — LANCETS 30G MISC: 1.0000 | Freq: Every day | 3 refills | Status: DC

## 2018-04-30 DIAGNOSIS — L578 Other skin changes due to chronic exposure to nonionizing radiation: Secondary | ICD-10-CM | POA: Diagnosis not present

## 2018-04-30 DIAGNOSIS — L570 Actinic keratosis: Secondary | ICD-10-CM | POA: Diagnosis not present

## 2018-04-30 DIAGNOSIS — L820 Inflamed seborrheic keratosis: Secondary | ICD-10-CM | POA: Diagnosis not present

## 2018-05-16 ENCOUNTER — Other Ambulatory Visit: Payer: Self-pay | Admitting: Family Medicine

## 2018-05-16 DIAGNOSIS — E1165 Type 2 diabetes mellitus with hyperglycemia: Secondary | ICD-10-CM

## 2018-06-06 ENCOUNTER — Encounter: Payer: Self-pay | Admitting: Family Medicine

## 2018-06-28 ENCOUNTER — Ambulatory Visit (INDEPENDENT_AMBULATORY_CARE_PROVIDER_SITE_OTHER): Payer: 59 | Admitting: Family Medicine

## 2018-06-28 ENCOUNTER — Encounter: Payer: Self-pay | Admitting: Family Medicine

## 2018-06-28 VITALS — BP 120/72 | HR 98 | Temp 98.1°F | Resp 18 | Ht 69.0 in | Wt 229.0 lb

## 2018-06-28 DIAGNOSIS — B9689 Other specified bacterial agents as the cause of diseases classified elsewhere: Secondary | ICD-10-CM | POA: Diagnosis not present

## 2018-06-28 DIAGNOSIS — J019 Acute sinusitis, unspecified: Secondary | ICD-10-CM

## 2018-06-28 DIAGNOSIS — H6983 Other specified disorders of Eustachian tube, bilateral: Secondary | ICD-10-CM | POA: Diagnosis not present

## 2018-06-28 MED ORDER — PREDNISONE 20 MG PO TABS: ORAL_TABLET | ORAL | 0 refills | Status: DC

## 2018-06-28 MED ORDER — AMOXICILLIN-POT CLAVULANATE 875-125 MG PO TABS: 1.0000 | ORAL_TABLET | Freq: Two times a day (BID) | ORAL | 0 refills | Status: DC

## 2018-06-28 MED ORDER — HYDROCODONE-HOMATROPINE 5-1.5 MG/5ML PO SYRP: 5.0000 mL | ORAL_SOLUTION | Freq: Three times a day (TID) | ORAL | 0 refills | Status: DC | PRN

## 2018-06-28 NOTE — Progress Notes (Signed)
Subjective:    Patient ID: Joseph [name], male    DOB: 17-Sep-1960, 58 y.o.   MRN: [id number]  HPI  Patient presents today with symptoms of a sinus infection.  Symptoms began with a chest cold about 3 weeks ago.  Over the last week he has developed pain and pressure in his frontal sinuses bilaterally.  He has fluid trapped behind both of his tympanic membranes.  Both ears feel stopped up.  He has pain in his left cheek.  He has a nasal polyp in his left nostril occluding his nasal passage and the pain is worse on the left side.  He has subjective fevers.  He has postnasal drip causing a constant cough.  Past Medical History:  Diagnosis Date  . Allergy   . Diabetes mellitus without complication ([place])   . Hyperlipidemia   . Hypertension    Past Surgical History:  Procedure Laterality Date  . COLONOSCOPY  2015  . INSERTION OF MESH N/A 10/31/2017   Procedure: INSERTION OF MESH;  Surgeon: Coralie Keens, MD;  Location: [facility];  Service: General;  Laterality: N/A;  . UMBILICAL HERNIA REPAIR N/A 10/31/2017   Procedure: UMBILICAL HERNIA REPAIR WITH MESH;  Surgeon: Coralie Keens, MD;  Location: [facility];  Service: General;  Laterality: N/A;  . VASECTOMY  2006   Current Outpatient Medications on File Prior to Visit  Medication Sig Dispense Refill  . amLODipine (NORVASC) 10 MG tablet TAKE 1 TABLET BY MOUTH EVERY DAY 90 tablet 3  . aspirin 81 MG tablet Take 81 mg by mouth daily.    Marland Kitchen atorvastatin (LIPITOR) 80 MG tablet TAKE 1 TABLET BY MOUTH AT BEDTIME 90 tablet 1  . blood glucose meter kit and supplies KIT Dispense based on patient and insurance preference. Check fasting blood sugar each morning.  E11.65 1 each 0  . diclofenac (VOLTAREN) 75 MG EC tablet TAKE 1 TABLET BY MOUTH TWICE A DAY 60 tablet 2  . empagliflozin (JARDIANCE) 25 MG TABS tablet Take 25 mg by mouth daily. 90 tablet 1  . famotidine (PEPCID AC) 10 MG chewable tablet Chew 10 mg by mouth as needed for heartburn.    . fluticasone  (FLONASE) 50 MCG/ACT nasal spray Place 2 sprays into both nostrils daily. 48 g 11  . JARDIANCE 25 MG TABS tablet TAKE 1 TABLET BY MOUTH DAILY 90 tablet 1  . Lancets 30G MISC 1 each by Does not apply route daily. 100 each 3  . lisinopril-hydrochlorothiazide (PRINZIDE,ZESTORETIC) 20-12.5 MG tablet TAKE 2 TABLETS BY MOUTH DAILY 180 tablet 3  . loratadine (CLARITIN) 10 MG tablet Take 10 mg by mouth daily as needed for allergies.    . Lysine 500 MG CAPS Take 1 capsule by mouth daily.    . metFORMIN (GLUCOPHAGE) 1000 MG tablet TAKE 1 TABLET BY MOUTH TWICE A DAY WITH A MEAL 180 tablet 1  . Multiple Vitamins-Minerals (MULTIVITAMIN PO) Take 1 tablet by mouth daily.     . ONE TOUCH ULTRA TEST test strip CHECK FASTING BLOOD SUGAR EACH MORNING 100 each 3  . triamcinolone cream (KENALOG) 0.1 % APPLY ON THE SKIN TWICE A DAY AS NEEDED  3  . TRULICITY 8.75 IE/3.3IR SOPN INJECT 0.75 MG INTO THE SKIN ONCE A WEEK. 12 pen 1   No current facility-administered medications on file prior to visit.     No Known Allergies Social History   Socioeconomic History  . Marital status: Married    Spouse name: Not on file  .  Number of children: Not on file  . Years of education: Not on file  . Highest education level: Not on file  Occupational History  . Not on file  Social Needs  . Financial resource strain: Not on file  . Food insecurity:    Worry: Not on file    Inability: Not on file  . Transportation needs:    Medical: Not on file    Non-medical: Not on file  Tobacco Use  . Smoking status: Never Smoker  . Smokeless tobacco: Never Used  Substance and Sexual Activity  . Alcohol use: No  . Drug use: No  . Sexual activity: Yes    Comment: married to [place].  Three kids.  exercising daily.  Lifestyle  . Physical activity:    Days per week: Not on file    Minutes per session: Not on file  . Stress: Not on file  Relationships  . Social connections:    Talks on phone: Not on file    Gets together: Not  on file    Attends religious service: Not on file    Active member of club or organization: Not on file    Attends meetings of clubs or organizations: Not on file    Relationship status: Not on file  . Intimate partner violence:    Fear of current or ex partner: Not on file    Emotionally abused: Not on file    Physically abused: Not on file    Forced sexual activity: Not on file  Other Topics Concern  . Not on file  Social History Narrative  . Not on file     Review of Systems  All other systems reviewed and are negative.      Objective:   Physical Exam Vitals signs reviewed.  Constitutional:      Appearance: He is well-developed.  HENT:     Right Ear: A middle ear effusion is present.     Left Ear: A middle ear effusion is present.     Nose: Mucosal edema and rhinorrhea present.     Right Turbinates: Enlarged.     Left Turbinates: Enlarged.     Right Sinus: Frontal sinus tenderness present.     Left Sinus: Frontal sinus tenderness present.  Cardiovascular:     Rate and Rhythm: Normal rate and regular rhythm.     Heart sounds: Normal heart sounds. No murmur. No friction rub. No gallop.   Pulmonary:     Effort: Pulmonary effort is normal. No respiratory distress.     Breath sounds: No stridor. Rhonchi present. No wheezing or rales.  Chest:     Chest wall: No tenderness.  Abdominal:     General: Bowel sounds are normal. There is no distension.     Palpations: Abdomen is soft. There is no mass.     Tenderness: There is no abdominal tenderness. There is no guarding or rebound.  Neurological:     Mental Status: He is alert and oriented to person, place, and time.     Cranial Nerves: No cranial nerve deficit.     Coordination: Coordination normal.     Deep Tendon Reflexes: Reflexes normal.             Assessment & Plan:  Patient has a sinus infection.  Treat with a prednisone taper pack.  Monitor blood sugars closely.  If greater than 300 he is to contact me.   Add Augmentin 875 mg p.o. twice daily for 10 days.  Use Hycodan 1 teaspoon every 8 hours as needed for cough

## 2018-07-03 ENCOUNTER — Other Ambulatory Visit: Payer: Self-pay | Admitting: Family Medicine

## 2018-07-03 MED ORDER — DICLOFENAC SODIUM 75 MG PO TBEC: 75.0000 mg | DELAYED_RELEASE_TABLET | Freq: Two times a day (BID) | ORAL | 3 refills | Status: DC

## 2018-07-11 ENCOUNTER — Telehealth: Payer: Self-pay | Admitting: Family Medicine

## 2018-07-11 NOTE — Telephone Encounter (Signed)
Pt called and states that he has finished the medication you gave him and he still has fluid and ringing in the right ear - some in left but not as bad and was wondering what else he can do?

## 2018-07-12 ENCOUNTER — Other Ambulatory Visit: Payer: Self-pay | Admitting: *Deleted

## 2018-07-12 MED ORDER — DULAGLUTIDE 0.75 MG/0.5ML ~~LOC~~ SOAJ: SUBCUTANEOUS | 1 refills | Status: DC

## 2018-07-12 NOTE — Telephone Encounter (Signed)
I would do flonase and xyzal to try to remove any middle ear fluid.  If no better in 1 week, ntbs

## 2018-07-12 NOTE — Telephone Encounter (Signed)
Pt aware of recommendations via mychart

## 2018-07-22 ENCOUNTER — Other Ambulatory Visit: Payer: BLUE CROSS/BLUE SHIELD

## 2018-07-26 ENCOUNTER — Ambulatory Visit: Payer: BLUE CROSS/BLUE SHIELD | Admitting: Family Medicine

## 2018-08-05 ENCOUNTER — Other Ambulatory Visit: Payer: Self-pay

## 2018-08-05 ENCOUNTER — Other Ambulatory Visit: Payer: 59

## 2018-08-05 ENCOUNTER — Other Ambulatory Visit: Payer: BLUE CROSS/BLUE SHIELD

## 2018-08-05 DIAGNOSIS — E785 Hyperlipidemia, unspecified: Secondary | ICD-10-CM

## 2018-08-05 DIAGNOSIS — I10 Essential (primary) hypertension: Secondary | ICD-10-CM

## 2018-08-05 DIAGNOSIS — E1165 Type 2 diabetes mellitus with hyperglycemia: Secondary | ICD-10-CM

## 2018-08-06 LAB — CBC WITH DIFFERENTIAL/PLATELET
Absolute Monocytes: 781 cells/uL (ref 200–950)
Basophils Absolute: 67 cells/uL (ref 0–200)
Basophils Relative: 0.8 %
Eosinophils Absolute: 143 cells/uL (ref 15–500)
Eosinophils Relative: 1.7 %
HCT: 48.6 % (ref 38.5–50.0)
Hemoglobin: 16.6 g/dL (ref 13.2–17.1)
Lymphs Abs: 2696 cells/uL (ref 850–3900)
MCH: 28.9 pg (ref 27.0–33.0)
MCHC: 34.2 g/dL (ref 32.0–36.0)
MCV: 84.7 fL (ref 80.0–100.0)
MPV: 11.3 fL (ref 7.5–12.5)
Monocytes Relative: 9.3 %
Neutro Abs: 4712 cells/uL (ref 1500–7800)
Neutrophils Relative %: 56.1 %
Platelets: 270 10*3/uL (ref 140–400)
RBC: 5.74 10*6/uL (ref 4.20–5.80)
RDW: 14 % (ref 11.0–15.0)
Total Lymphocyte: 32.1 %
WBC: 8.4 10*3/uL (ref 3.8–10.8)

## 2018-08-06 LAB — LIPID PANEL
Cholesterol: 121 mg/dL (ref <200)
HDL: 38 mg/dL — ABNORMAL LOW (ref >=40)
LDL Cholesterol (Calc): 62 mg/dL (calc)
Non-HDL Cholesterol (Calc): 83 mg/dL (calc) (ref <130)
Total CHOL/HDL Ratio: 3.2 (calc) (ref <5.0)
Triglycerides: 124 mg/dL (ref <150)

## 2018-08-06 LAB — COMPREHENSIVE METABOLIC PANEL
AG Ratio: 1.9 (calc) (ref 1.0–2.5)
ALT: 69 U/L — ABNORMAL HIGH (ref 9–46)
AST: 40 U/L — ABNORMAL HIGH (ref 10–35)
Albumin: 4.6 g/dL (ref 3.6–5.1)
Alkaline phosphatase (APISO): 53 U/L (ref 35–144)
BUN: 19 mg/dL (ref 7–25)
CO2: 25 mmol/L (ref 20–32)
Calcium: 9.8 mg/dL (ref 8.6–10.3)
Chloride: 101 mmol/L (ref 98–110)
Creat: 1.04 mg/dL (ref 0.70–1.33)
Globulin: 2.4 g/dL (calc) (ref 1.9–3.7)
Glucose, Bld: 132 mg/dL — ABNORMAL HIGH (ref 65–99)
Potassium: 3.7 mmol/L (ref 3.5–5.3)
Sodium: 139 mmol/L (ref 135–146)
Total Bilirubin: 0.6 mg/dL (ref 0.2–1.2)
Total Protein: 7 g/dL (ref 6.1–8.1)

## 2018-08-06 LAB — HEMOGLOBIN A1C
Hgb A1c MFr Bld: 7.7 % of total Hgb — ABNORMAL HIGH (ref <5.7)
Mean Plasma Glucose: 174 (calc)
eAG (mmol/L): 9.7 (calc)

## 2018-08-09 ENCOUNTER — Ambulatory Visit: Payer: BLUE CROSS/BLUE SHIELD | Admitting: Family Medicine

## 2018-08-15 ENCOUNTER — Ambulatory Visit (INDEPENDENT_AMBULATORY_CARE_PROVIDER_SITE_OTHER): Payer: 59 | Admitting: Family Medicine

## 2018-08-15 ENCOUNTER — Encounter: Payer: Self-pay | Admitting: Family Medicine

## 2018-08-15 ENCOUNTER — Ambulatory Visit: Payer: 59 | Admitting: Family Medicine

## 2018-08-15 ENCOUNTER — Other Ambulatory Visit: Payer: Self-pay

## 2018-08-15 VITALS — BP 120/68 | HR 78 | Temp 98.6°F | Resp 18 | Ht 69.0 in | Wt 225.0 lb

## 2018-08-15 DIAGNOSIS — E785 Hyperlipidemia, unspecified: Secondary | ICD-10-CM

## 2018-08-15 DIAGNOSIS — I10 Essential (primary) hypertension: Secondary | ICD-10-CM

## 2018-08-15 DIAGNOSIS — E1165 Type 2 diabetes mellitus with hyperglycemia: Secondary | ICD-10-CM | POA: Diagnosis not present

## 2018-08-15 MED ORDER — DULAGLUTIDE 1.5 MG/0.5ML ~~LOC~~ SOAJ: 1.5000 mg | SUBCUTANEOUS | 3 refills | Status: DC

## 2018-08-15 NOTE — Progress Notes (Signed)
Subjective:    Patient ID: Joseph [name], male    DOB: 12-10-1960, 58 y.o.   MRN: [id number]  HPI  12/2017 Here for follow up of his diabetes, hyperlipidemia, and hypertension.  Due for diabetic foot exam and flu shot.   He agrees to receive his flu shot today.  The bursitis in his left elbow has resolved approximately 95%.  There is no swelling or erythema today.  There is only mild residual pain.  I reviewed his fasting lab work with him which was significant for persistent elevation in his ALT as well as a hemoglobin A1c of 7.5.  Please see below in the objective section for the lab work.  He denies any polyuria, polydipsia, or blurry vision.  He denies any chest pain shortness of breath or dyspnea on exertion.  He is watching his diet and he has lost approximately 5 pounds.  However he admits to drinking sweet tea almost with every meal.  He is also not engaging in vigorous aerobic exercise.  He denies any myalgias on his statin.  At that time, my plan was: We discussed his options which included adding Actos to his medication or focusing on diet exercise and weight loss and trying to lose 10 to 20 pounds over the next 6 months as a means of correcting his nonalcoholic fatty liver disease as well as lowering his A1c to an ultimate goal of 6.5.  After long discussion, patient would like to try 6 months of aggressive lifestyle changes.  I explained to the patient that this necessitates that he just stops drinking sweet tea and soda and drinks only water and also picks up the intensity of his aerobic exercise to 30 minutes a day of vigorous aerobic exercise 5 days a week.  Patient received his flu shot and will recheck lab work in March.  Blood pressure and LDL cholesterol are well controlled.  08/15/18 Wt Readings from Last 3 Encounters:  08/15/18 225 lb (102.1 kg)  06/28/18 229 lb (103.9 kg)  01/18/18 224 lb (101.6 kg)    Labs are listed below: Lab on 08/05/2018  Component Date Value Ref Range  Status  . WBC 08/05/2018 8.4  3.8 - 10.8 Thousand/uL Final  . RBC 08/05/2018 5.74  4.20 - 5.80 Million/uL Final  . Hemoglobin 08/05/2018 16.6  13.2 - 17.1 g/dL Final  . HCT 08/05/2018 48.6  38.5 - 50.0 % Final  . MCV 08/05/2018 84.7  80.0 - 100.0 fL Final  . MCH 08/05/2018 28.9  27.0 - 33.0 pg Final  . MCHC 08/05/2018 34.2  32.0 - 36.0 g/dL Final  . RDW 08/05/2018 14.0  11.0 - 15.0 % Final  . Platelets 08/05/2018 270  140 - 400 Thousand/uL Final  . MPV 08/05/2018 11.3  7.5 - 12.5 fL Final  . Neutro Abs 08/05/2018 4,712  1,500 - 7,800 cells/uL Final  . Lymphs Abs 08/05/2018 2,696  850 - 3,900 cells/uL Final  . Absolute Monocytes 08/05/2018 781  200 - 950 cells/uL Final  . Eosinophils Absolute 08/05/2018 143  15 - 500 cells/uL Final  . Basophils Absolute 08/05/2018 67  0 - 200 cells/uL Final  . Neutrophils Relative % 08/05/2018 56.1  % Final  . Total Lymphocyte 08/05/2018 32.1  % Final  . Monocytes Relative 08/05/2018 9.3  % Final  . Eosinophils Relative 08/05/2018 1.7  % Final  . Basophils Relative 08/05/2018 0.8  % Final  . Glucose, Bld 08/05/2018 132* 65 - 99 mg/dL Final   Comment: .  Fasting reference interval . For someone without known diabetes, a glucose value >125 mg/dL indicates that they may have diabetes and this should be confirmed with a follow-up test. .   . BUN 08/05/2018 19  7 - 25 mg/dL Final  . Creat 08/05/2018 1.04  0.70 - 1.33 mg/dL Final   Comment: For patients >61 years of age, the reference limit for Creatinine is approximately 13% higher for people identified as African-American. .   Havery Moros Ratio 46/50/3546 NOT APPLICABLE  6 - 22 (calc) Final  . Sodium 08/05/2018 139  135 - 146 mmol/L Final  . Potassium 08/05/2018 3.7  3.5 - 5.3 mmol/L Final  . Chloride 08/05/2018 101  98 - 110 mmol/L Final  . CO2 08/05/2018 25  20 - 32 mmol/L Final  . Calcium 08/05/2018 9.8  8.6 - 10.3 mg/dL Final  . Total Protein 08/05/2018 7.0  6.1 - 8.1 g/dL  Final  . Albumin 08/05/2018 4.6  3.6 - 5.1 g/dL Final  . Globulin 08/05/2018 2.4  1.9 - 3.7 g/dL (calc) Final  . AG Ratio 08/05/2018 1.9  1.0 - 2.5 (calc) Final  . Total Bilirubin 08/05/2018 0.6  0.2 - 1.2 mg/dL Final  . Alkaline phosphatase (APISO) 08/05/2018 53  35 - 144 U/L Final  . AST 08/05/2018 40* 10 - 35 U/L Final  . ALT 08/05/2018 69* 9 - 46 U/L Final  . Cholesterol 08/05/2018 121  <200 mg/dL Final  . HDL 08/05/2018 38* > OR = 40 mg/dL Final  . Triglycerides 08/05/2018 124  <150 mg/dL Final  . LDL Cholesterol (Calc) 08/05/2018 62  mg/dL (calc) Final   Comment: Reference range: <100 . Desirable range <100 mg/dL for primary prevention;   <70 mg/dL for patients with CHD or diabetic patients  with > or = 2 CHD risk factors. Marland Kitchen LDL-C is now calculated using the Martin-Hopkins  calculation, which is a validated novel method providing  better accuracy than the Friedewald equation in the  estimation of LDL-C.  Cresenciano Genre et al. Annamaria Helling. 5681;275(17): 2061-2068  (http://education.[facility].com/faq/FAQ164)   . Total CHOL/HDL Ratio 08/05/2018 3.2  <5.0 (calc) Final  . Non-HDL Cholesterol (Calc) 08/05/2018 83  <130 mg/dL (calc) Final   Comment: For patients with diabetes plus 1 major ASCVD risk  factor, treating to a non-HDL-C goal of <100 mg/dL  (LDL-C of <70 mg/dL) is considered a therapeutic  option.   . Hgb A1c MFr Bld 08/05/2018 7.7* <5.7 % of total Hgb Final   Comment: For someone without known diabetes, a hemoglobin A1c value of 6.5% or greater indicates that they may have  diabetes and this should be confirmed with a follow-up  test. . For someone with known diabetes, a value <7% indicates  that their diabetes is well controlled and a value  greater than or equal to 7% indicates suboptimal  control. A1c targets should be individualized based on  duration of diabetes, age, comorbid conditions, and  other considerations. . Currently, no consensus exists regarding use  of hemoglobin A1c for diagnosis of diabetes for children. .   . Mean Plasma Glucose 08/05/2018 174  (calc) Final  . eAG (mmol/L) 08/05/2018 9.7  (calc) Final    Patient is here today for a follow-up of his diabetes.  Since I last saw the patient, his weight is essentially unchanged.  In fact he has gained a pound.  He also recently took prednisone due to persistent eustachian tube dysfunction.  As a result his hemoglobin A1c has not changed.  He was 7.5 in September.  He 7.7 today which may reflect the recent use of prednisone.  He denies any polyuria polydipsia or blurry vision.  His blood pressure at home however has been doing well.  He states that his systolic blood pressure is in the 1 14-1 20 range and he is interested in possibly stopping some of his blood pressure medication.  He denies any chest pain shortness of breath or dyspnea on exertion.  He denies any neuropathy.  As evidenced on his lab work, his cholesterol is excellent aside from his HDL cholesterol which is low at 38.  He denies any myalgias or right upper quadrant pain. Past Medical History:  Diagnosis Date  . Allergy   . Diabetes mellitus without complication ([place])   . Hyperlipidemia   . Hypertension    Past Surgical History:  Procedure Laterality Date  . COLONOSCOPY  2015  . INSERTION OF MESH N/A 10/31/2017   Procedure: INSERTION OF MESH;  Surgeon: Coralie Keens, MD;  Location: [facility];  Service: General;  Laterality: N/A;  . UMBILICAL HERNIA REPAIR N/A 10/31/2017   Procedure: UMBILICAL HERNIA REPAIR WITH MESH;  Surgeon: Coralie Keens, MD;  Location: [facility];  Service: General;  Laterality: N/A;  . VASECTOMY  2006   Current Outpatient Medications on File Prior to Visit  Medication Sig Dispense Refill  . amLODipine (NORVASC) 10 MG tablet TAKE 1 TABLET BY MOUTH EVERY DAY 90 tablet 3  . amoxicillin-clavulanate (AUGMENTIN) 875-125 MG tablet Take 1 tablet by mouth 2 (two) times daily. 20 tablet 0  . aspirin 81 MG  tablet Take 81 mg by mouth daily.    Marland Kitchen atorvastatin (LIPITOR) 80 MG tablet TAKE 1 TABLET BY MOUTH AT BEDTIME 90 tablet 1  . blood glucose meter kit and supplies KIT Dispense based on patient and insurance preference. Check fasting blood sugar each morning.  E11.65 1 each 0  . diclofenac (VOLTAREN) 75 MG EC tablet Take 1 tablet (75 mg total) by mouth 2 (two) times daily. 180 tablet 3  . Dulaglutide (TRULICITY) 0.94 BS/9.6GE SOPN INJECT 0.75 MG INTO THE SKIN ONCE A WEEK. 12 pen 1  . empagliflozin (JARDIANCE) 25 MG TABS tablet Take 25 mg by mouth daily. 90 tablet 1  . famotidine (PEPCID AC) 10 MG chewable tablet Chew 10 mg by mouth as needed for heartburn.    . fluticasone (FLONASE) 50 MCG/ACT nasal spray Place 2 sprays into both nostrils daily. 48 g 11  . HYDROcodone-homatropine (HYCODAN) 5-1.5 MG/5ML syrup Take 5 mLs by mouth every 8 (eight) hours as needed for cough. 120 mL 0  . JARDIANCE 25 MG TABS tablet TAKE 1 TABLET BY MOUTH DAILY 90 tablet 1  . Lancets 30G MISC 1 each by Does not apply route daily. 100 each 3  . lisinopril-hydrochlorothiazide (PRINZIDE,ZESTORETIC) 20-12.5 MG tablet TAKE 2 TABLETS BY MOUTH DAILY 180 tablet 3  . loratadine (CLARITIN) 10 MG tablet Take 10 mg by mouth daily as needed for allergies.    . Lysine 500 MG CAPS Take 1 capsule by mouth daily.    . metFORMIN (GLUCOPHAGE) 1000 MG tablet TAKE 1 TABLET BY MOUTH TWICE A DAY WITH A MEAL 180 tablet 1  . Multiple Vitamins-Minerals (MULTIVITAMIN PO) Take 1 tablet by mouth daily.     . ONE TOUCH ULTRA TEST test strip CHECK FASTING BLOOD SUGAR EACH MORNING 100 each 3  . predniSONE (DELTASONE) 20 MG tablet 3 tabs poqday 1-2, 2 tabs poqday 3-4, 1 tab poqday 5-6  12 tablet 0  . triamcinolone cream (KENALOG) 0.1 % APPLY ON THE SKIN TWICE A DAY AS NEEDED  3   No current facility-administered medications on file prior to visit.     No Known Allergies Social History   Socioeconomic History  . Marital status: Married    Spouse  name: Not on file  . Number of children: Not on file  . Years of education: Not on file  . Highest education level: Not on file  Occupational History  . Not on file  Social Needs  . Financial resource strain: Not on file  . Food insecurity:    Worry: Not on file    Inability: Not on file  . Transportation needs:    Medical: Not on file    Non-medical: Not on file  Tobacco Use  . Smoking status: Never Smoker  . Smokeless tobacco: Never Used  Substance and Sexual Activity  . Alcohol use: No  . Drug use: No  . Sexual activity: Yes    Comment: married to [place].  Three kids.  exercising daily.  Lifestyle  . Physical activity:    Days per week: Not on file    Minutes per session: Not on file  . Stress: Not on file  Relationships  . Social connections:    Talks on phone: Not on file    Gets together: Not on file    Attends religious service: Not on file    Active member of club or organization: Not on file    Attends meetings of clubs or organizations: Not on file    Relationship status: Not on file  . Intimate partner violence:    Fear of current or ex partner: Not on file    Emotionally abused: Not on file    Physically abused: Not on file    Forced sexual activity: Not on file  Other Topics Concern  . Not on file  Social History Narrative  . Not on file     Review of Systems  All other systems reviewed and are negative.      Objective:   Physical Exam Vitals signs reviewed.  Constitutional:      Appearance: He is well-developed.  Cardiovascular:     Rate and Rhythm: Normal rate and regular rhythm.     Heart sounds: Normal heart sounds. No murmur. No friction rub. No gallop.   Pulmonary:     Effort: Pulmonary effort is normal. No respiratory distress.     Breath sounds: Normal breath sounds. No stridor. No wheezing or rales.  Chest:     Chest wall: No tenderness.  Neurological:     Mental Status: He is alert.            Assessment & Plan:   Uncontrolled type 2 diabetes mellitus with hyperglycemia, without long-term current use of insulin (HCC)  Hyperlipidemia, unspecified hyperlipidemia type  Essential hypertension  Diabetes is not well controlled.  We have increased his Trulicity to 1.5 mg weekly and I have strongly encouraged the patient to try to lose 10 to 15 pounds.  I think if we do these 2 things, and recheck his hemoglobin A1c in 6 months, it will be below 6.5.  The next that would be to add Actos if we are unsuccessful but the patient would like to avoid as much medication as possible.  His blood pressure is well controlled and therefore I think he can discontinue amlodipine.  Monitor blood pressure at home and if blood  pressure rises greater than 140/90, we will resume amlodipine 5 mg a day.  Cholesterol is acceptable.  We will continue Lipitor for now.  I believe the elevations in his liver function test are due to fatty liver disease and not his statin however if the patient loses weight and the LFTs remain elevated we may temporarily discontinue Lipitor to see if his liver function test improved.  More than 25 minutes were spent today with the patient in discussion.

## 2018-08-23 ENCOUNTER — Other Ambulatory Visit: Payer: Self-pay | Admitting: *Deleted

## 2018-08-23 MED ORDER — EMPAGLIFLOZIN 25 MG PO TABS: 25.0000 mg | ORAL_TABLET | Freq: Every day | ORAL | 1 refills | Status: DC

## 2018-09-02 ENCOUNTER — Telehealth: Payer: Self-pay | Admitting: Family Medicine

## 2018-09-02 MED ORDER — AMLODIPINE BESYLATE 5 MG PO TABS: 5.0000 mg | ORAL_TABLET | Freq: Every day | ORAL | 3 refills | Status: DC

## 2018-09-02 NOTE — Telephone Encounter (Signed)
Pt called and states that since stopping the Amlodipine his BP has slowly increased to where it stays 140/90 or above. He did restart his amlodipine and now needs a refill on it. Per lov ok to restart Amlodipine 5 mg. Med sent to pharm.

## 2018-09-05 ENCOUNTER — Other Ambulatory Visit: Payer: Self-pay | Admitting: Family Medicine

## 2018-11-22 ENCOUNTER — Other Ambulatory Visit: Payer: Self-pay | Admitting: Family Medicine

## 2018-11-22 MED ORDER — LISINOPRIL-HYDROCHLOROTHIAZIDE 20-12.5 MG PO TABS: 2.0000 | ORAL_TABLET | Freq: Every day | ORAL | 3 refills | Status: DC

## 2018-12-30 ENCOUNTER — Other Ambulatory Visit: Payer: Self-pay

## 2018-12-30 ENCOUNTER — Ambulatory Visit (INDEPENDENT_AMBULATORY_CARE_PROVIDER_SITE_OTHER): Payer: 59 | Admitting: Family Medicine

## 2018-12-30 DIAGNOSIS — L03213 Periorbital cellulitis: Secondary | ICD-10-CM | POA: Diagnosis not present

## 2018-12-30 MED ORDER — SULFAMETHOXAZOLE-TRIMETHOPRIM 800-160 MG PO TABS: 1.0000 | ORAL_TABLET | Freq: Two times a day (BID) | ORAL | 0 refills | Status: DC

## 2018-12-30 NOTE — Progress Notes (Signed)
Subjective:    Patient ID: Joseph [name], male    DOB: 1960-10-22, 58 y.o.   MRN: [id number]  HPI  Patient is being seen today as a telephone visit.  He consents to be seen over the telephone.  Phone call began at 913.  Phone call concluded at 923.  Patient states that he had a basal cell carcinoma removed from the crown of his head on 8/17.  Approximately 1-1/2 weeks later, and the patient is uncertain if it is related to that or not, the lymph nodes behind his ear and under his jaw became swollen and tender.  Over the weekend, he developed erythema warmth and pain around his right eye.  He states that the eyelid is very swollen.  He can open his eye approximately one third of the normal diameter.  The skin is hot and painful.  He states it looks like he has a black eye except instead of being purple it is red.  He states the redness spreads down to his cheek and up to his eyebrow.  However he denies any pain behind the eyeball.  He denies any pain with extraocular movement.  He denies any blurry vision.  The problem seems to be limited to the exterior of the eye and not the eyeball itself or behind the eyeball. Past Medical History:  Diagnosis Date  . Allergy   . Diabetes mellitus without complication ([place])   . Hyperlipidemia   . Hypertension    Past Surgical History:  Procedure Laterality Date  . COLONOSCOPY  2015  . INSERTION OF MESH N/A 10/31/2017   Procedure: INSERTION OF MESH;  Surgeon: Coralie Keens, MD;  Location: [facility];  Service: General;  Laterality: N/A;  . UMBILICAL HERNIA REPAIR N/A 10/31/2017   Procedure: UMBILICAL HERNIA REPAIR WITH MESH;  Surgeon: Coralie Keens, MD;  Location: [facility];  Service: General;  Laterality: N/A;  . VASECTOMY  2006   Current Outpatient Medications on File Prior to Visit  Medication Sig Dispense Refill  . amLODipine (NORVASC) 5 MG tablet Take 1 tablet (5 mg total) by mouth daily. 90 tablet 3  . aspirin 81 MG tablet Take 81 mg by mouth daily.     Marland Kitchen atorvastatin (LIPITOR) 80 MG tablet TAKE 1 TABLET BY MOUTH AT BEDTIME 90 tablet 1  . blood glucose meter kit and supplies KIT Dispense based on patient and insurance preference. Check fasting blood sugar each morning.  E11.65 1 each 0  . diclofenac (VOLTAREN) 75 MG EC tablet Take 1 tablet (75 mg total) by mouth 2 (two) times daily. 180 tablet 3  . Dulaglutide (TRULICITY) 1.5 NW/2.9FA SOPN Inject 1.5 mg into the skin once a week. 12 pen 3  . empagliflozin (JARDIANCE) 25 MG TABS tablet Take 25 mg by mouth daily. 90 tablet 1  . famotidine (PEPCID AC) 10 MG chewable tablet Chew 10 mg by mouth as needed for heartburn.    . fluticasone (FLONASE) 50 MCG/ACT nasal spray Place 2 sprays into both nostrils daily. 48 g 11  . Lancets 30G MISC 1 each by Does not apply route daily. 100 each 3  . lisinopril-hydrochlorothiazide (ZESTORETIC) 20-12.5 MG tablet Take 2 tablets by mouth daily. 180 tablet 3  . loratadine (CLARITIN) 10 MG tablet Take 10 mg by mouth daily as needed for allergies.    . Lysine 500 MG CAPS Take 1 capsule by mouth daily.    . metFORMIN (GLUCOPHAGE) 1000 MG tablet TAKE 1 TABLET BY MOUTH TWICE A DAY  WITH A MEAL 180 tablet 1  . Multiple Vitamins-Minerals (MULTIVITAMIN PO) Take 1 tablet by mouth daily.     . ONE TOUCH ULTRA TEST test strip CHECK FASTING BLOOD SUGAR EACH MORNING 100 each 3  . triamcinolone cream (KENALOG) 0.1 % APPLY ON THE SKIN TWICE A DAY AS NEEDED  3   No current facility-administered medications on file prior to visit.     No Known Allergies Social History   Socioeconomic History  . Marital status: Married    Spouse name: Not on file  . Number of children: Not on file  . Years of education: Not on file  . Highest education level: Not on file  Occupational History  . Not on file  Social Needs  . Financial resource strain: Not on file  . Food insecurity    Worry: Not on file    Inability: Not on file  . Transportation needs    Medical: Not on file     Non-medical: Not on file  Tobacco Use  . Smoking status: Never Smoker  . Smokeless tobacco: Never Used  Substance and Sexual Activity  . Alcohol use: No  . Drug use: No  . Sexual activity: Yes    Comment: married to [place].  Three kids.  exercising daily.  Lifestyle  . Physical activity    Days per week: Not on file    Minutes per session: Not on file  . Stress: Not on file  Relationships  . Social Herbalist on phone: Not on file    Gets together: Not on file    Attends religious service: Not on file    Active member of club or organization: Not on file    Attends meetings of clubs or organizations: Not on file    Relationship status: Not on file  . Intimate partner violence    Fear of current or ex partner: Not on file    Emotionally abused: Not on file    Physically abused: Not on file    Forced sexual activity: Not on file  Other Topics Concern  . Not on file  Social History Narrative  . Not on file     Review of Systems  All other systems reviewed and are negative.      Objective:    I explained to the patient is difficult to ascertain over the telephone however it sounds like the patient may be developing preseptal cellulitis around the right eye.  I asked the patient to perform extraocular movements and he denied any pain with extraocular movement behind the eye        Assessment & Plan:  Preseptal cellulitis of right eye  Patient was treated with Bactrim double strength tablets twice daily for 10 days.  Recommended he start the antibiotics immediately.  I explained the difference between preseptal cellulitis and orbital cellulitis.  If the pain develops any pain or pressure behind the eyeball or pain with extraocular movement behind the eyeball he needs to call me immediately or go to the emergency room to have a CAT scan of the orbit obtained to evaluate for orbital cellulitis.  However if the pain redness and swelling is improving over the next 48  hours on antibiotics, he is instructed just to complete the course of Bactrim.  I suspect the lymph nodes are reactive and would wait to see the response to antibiotics.  Patient is comfortable with this plan

## 2019-01-12 ENCOUNTER — Other Ambulatory Visit: Payer: Self-pay | Admitting: Family Medicine

## 2019-01-12 DIAGNOSIS — E1165 Type 2 diabetes mellitus with hyperglycemia: Secondary | ICD-10-CM

## 2019-01-31 ENCOUNTER — Ambulatory Visit (INDEPENDENT_AMBULATORY_CARE_PROVIDER_SITE_OTHER): Payer: 59

## 2019-01-31 ENCOUNTER — Other Ambulatory Visit: Payer: Self-pay

## 2019-01-31 DIAGNOSIS — Z23 Encounter for immunization: Secondary | ICD-10-CM | POA: Diagnosis not present

## 2019-02-13 ENCOUNTER — Other Ambulatory Visit: Payer: Self-pay | Admitting: Family Medicine

## 2019-04-07 LAB — HM DIABETES EYE EXAM

## 2019-05-26 ENCOUNTER — Emergency Department (HOSPITAL_COMMUNITY): Payer: No Typology Code available for payment source

## 2019-05-26 ENCOUNTER — Emergency Department: Payer: Self-pay

## 2019-05-26 ENCOUNTER — Inpatient Hospital Stay (HOSPITAL_COMMUNITY)
Admission: EM | Admit: 2019-05-26 | Discharge: 2019-06-02 | DRG: 177 | Disposition: A | Discharge status: Home / Self Care | Payer: No Typology Code available for payment source | Attending: Internal Medicine | Admitting: Internal Medicine

## 2019-05-26 ENCOUNTER — Encounter (HOSPITAL_COMMUNITY): Payer: Self-pay | Admitting: Emergency Medicine

## 2019-05-26 ENCOUNTER — Other Ambulatory Visit: Payer: Self-pay

## 2019-05-26 DIAGNOSIS — J1282 Pneumonia due to coronavirus disease 2019: Secondary | ICD-10-CM | POA: Diagnosis present

## 2019-05-26 DIAGNOSIS — U071 COVID-19: Secondary | ICD-10-CM | POA: Diagnosis present

## 2019-05-26 DIAGNOSIS — Z823 Family history of stroke: Secondary | ICD-10-CM | POA: Diagnosis not present

## 2019-05-26 DIAGNOSIS — R0902 Hypoxemia: Secondary | ICD-10-CM

## 2019-05-26 DIAGNOSIS — Z7982 Long term (current) use of aspirin: Secondary | ICD-10-CM

## 2019-05-26 DIAGNOSIS — E1169 Type 2 diabetes mellitus with other specified complication: Secondary | ICD-10-CM | POA: Diagnosis present

## 2019-05-26 DIAGNOSIS — J9601 Acute respiratory failure with hypoxia: Secondary | ICD-10-CM | POA: Diagnosis present

## 2019-05-26 DIAGNOSIS — I152 Hypertension secondary to endocrine disorders: Secondary | ICD-10-CM | POA: Diagnosis present

## 2019-05-26 DIAGNOSIS — E785 Hyperlipidemia, unspecified: Secondary | ICD-10-CM | POA: Diagnosis present

## 2019-05-26 DIAGNOSIS — Z7984 Long term (current) use of oral hypoglycemic drugs: Secondary | ICD-10-CM

## 2019-05-26 DIAGNOSIS — Z79899 Other long term (current) drug therapy: Secondary | ICD-10-CM | POA: Diagnosis not present

## 2019-05-26 DIAGNOSIS — E876 Hypokalemia: Secondary | ICD-10-CM | POA: Diagnosis present

## 2019-05-26 DIAGNOSIS — E119 Type 2 diabetes mellitus without complications: Secondary | ICD-10-CM

## 2019-05-26 DIAGNOSIS — Z8249 Family history of ischemic heart disease and other diseases of the circulatory system: Secondary | ICD-10-CM

## 2019-05-26 DIAGNOSIS — E1159 Type 2 diabetes mellitus with other circulatory complications: Secondary | ICD-10-CM | POA: Diagnosis present

## 2019-05-26 DIAGNOSIS — E1165 Type 2 diabetes mellitus with hyperglycemia: Secondary | ICD-10-CM | POA: Diagnosis not present

## 2019-05-26 LAB — COMPREHENSIVE METABOLIC PANEL
ALT: 49 U/L — ABNORMAL HIGH (ref 0–44)
AST: 57 U/L — ABNORMAL HIGH (ref 15–41)
Albumin: 3 g/dL — ABNORMAL LOW (ref 3.5–5.0)
Alkaline Phosphatase: 43 U/L (ref 38–126)
Anion gap: 15 (ref 5–15)
BUN: 29 mg/dL — ABNORMAL HIGH (ref 6–20)
CO2: 21 mmol/L — ABNORMAL LOW (ref 22–32)
Calcium: 8.3 mg/dL — ABNORMAL LOW (ref 8.9–10.3)
Chloride: 104 mmol/L (ref 98–111)
Creatinine, Ser: 1.13 mg/dL (ref 0.61–1.24)
GFR calc Af Amer: >60 mL/min (ref >60)
GFR calc non Af Amer: >60 mL/min (ref >60)
Glucose, Bld: 147 mg/dL — ABNORMAL HIGH (ref 70–99)
Potassium: 3 mmol/L — ABNORMAL LOW (ref 3.5–5.1)
Sodium: 140 mmol/L (ref 135–145)
Total Bilirubin: 0.5 mg/dL (ref 0.3–1.2)
Total Protein: 6.4 g/dL — ABNORMAL LOW (ref 6.5–8.1)

## 2019-05-26 LAB — CBC WITH DIFFERENTIAL/PLATELET
Abs Immature Granulocytes: 0.04 10*3/uL (ref 0.00–0.07)
Basophils Absolute: 0 10*3/uL (ref 0.0–0.1)
Basophils Relative: 0 %
Eosinophils Absolute: 0 10*3/uL (ref 0.0–0.5)
Eosinophils Relative: 0 %
HCT: 49.2 % (ref 39.0–52.0)
Hemoglobin: 16.3 g/dL (ref 13.0–17.0)
Immature Granulocytes: 0 %
Lymphocytes Relative: 13 %
Lymphs Abs: 1.2 10*3/uL (ref 0.7–4.0)
MCH: 28.1 pg (ref 26.0–34.0)
MCHC: 33.1 g/dL (ref 30.0–36.0)
MCV: 84.7 fL (ref 80.0–100.0)
Monocytes Absolute: 0.7 10*3/uL (ref 0.1–1.0)
Monocytes Relative: 7 %
Neutro Abs: 7.4 10*3/uL (ref 1.7–7.7)
Neutrophils Relative %: 80 %
Platelets: 208 10*3/uL (ref 150–400)
RBC: 5.81 MIL/uL (ref 4.22–5.81)
RDW: 15.1 % (ref 11.5–15.5)
WBC: 9.4 10*3/uL (ref 4.0–10.5)
nRBC: 0 % (ref 0.0–0.2)

## 2019-05-26 LAB — D-DIMER, QUANTITATIVE: D-Dimer, Quant: 0.74 ug/mL-FEU — ABNORMAL HIGH (ref 0.00–0.50)

## 2019-05-26 LAB — LACTIC ACID, PLASMA: Lactic Acid, Venous: 1.6 mmol/L (ref 0.5–1.9)

## 2019-05-26 LAB — POC SARS CORONAVIRUS 2 AG -  ED: SARS Coronavirus 2 Ag: POSITIVE — AB

## 2019-05-26 LAB — PROCALCITONIN: Procalcitonin: 0.1 ng/mL

## 2019-05-26 LAB — FERRITIN: Ferritin: 561 ng/mL — ABNORMAL HIGH (ref 24–336)

## 2019-05-26 LAB — C-REACTIVE PROTEIN: CRP: 9.5 mg/dL — ABNORMAL HIGH (ref <1.0)

## 2019-05-26 LAB — LACTATE DEHYDROGENASE: LDH: 443 U/L — ABNORMAL HIGH (ref 98–192)

## 2019-05-26 LAB — TRIGLYCERIDES: Triglycerides: 77 mg/dL (ref <150)

## 2019-05-26 LAB — FIBRINOGEN: Fibrinogen: 741 mg/dL — ABNORMAL HIGH (ref 210–475)

## 2019-05-26 NOTE — ED Triage Notes (Addendum)
Pt sent by UC, diagnosed with COVID on Thursday, since then pt has developed a cough, shortness of breath and fever. UC CXR showed pneumonia. Pt desats with ambulation, sats in the low 80s. 90% on room air, 95% 3LNC. Given 2.5mg  albuterol and 0.5mg  atrovent PTA.

## 2019-05-26 NOTE — H&P (Signed)
History and Physical    Joseph [name] ZTI:[id number] DOB: 1961/03/19 DOA: 05/26/2019  PCP: Susy Frizzle, MD  Patient coming from: Urgent care  I have personally briefly reviewed patient's old medical records in [place]  Chief Complaint: Cough, dyspnea, fever, positive COVID-19 test  HPI: Joseph [name] is a 59 y.o. male with medical history significant for type 2 diabetes, hypertension, and hyperlipidemia who presents to the ED for evaluation of cough, dyspnea, and pneumonia in the setting of positive COVID-19 test.  Patient reports initially developing symptoms on Saturday, 05/17/2019 which included fevers, chills, cough with occasional white to yellowish sputum production, and sinus congestion.  He went to urgent care on 05/18/2019 and was started on a course of doxycycline.  He was also tested for COVID-19 which results the next day and was negative.  He is having continued symptoms and loss his appetite and was no longer eating or drinking adequately.  He reported having some associated diarrhea and shortness of breath with activity.  He went back to urgent care on 05/21/2019.  A repeat COVID-19 test was obtained and he was told it returned positive the following day.  Due to continued and worsening symptoms he again returned to urgent care on 05/26/2019.  He had a chest x-ray obtained which showed bilateral infiltrates.  He says his oxygen saturation was dropping to 91%.  He was advised to present to the ED for further evaluation.  ED Course:  Initial vitals showed BP 112/87, pulse 105, RR 24, temp 99.9 Fahrenheit, SPO2 94% on 3 L supplemental O2 via [place].  Patient reportedly desaturated to mid 80s on room air.  Labs are notable for potassium 3.0, sodium 140, bicarb 21, BUN 29, creatinine 1.13, serum glucose 147, AST 57, ALT 49, alk phos 43, total bilirubin 0.5, WBC 9.4, hemoglobin 16.3, platelets 208,000, D-dimer 0.74, LDH 443, procalcitonin 0.10, lactic acid 1.6.  POC SARS-CoV-2  antigen test is positive.  Blood cultures were obtained and pending.  Portable chest x-ray shows bilateral infiltrates in the mid lung fields.  The hospitalist service was consulted to admit for further evaluation and management.  Review of Systems: All systems reviewed and are negative except as documented in history of present illness above.   Past Medical History:  Diagnosis Date  . Allergy   . Diabetes mellitus without complication ([place])   . Hyperlipidemia   . Hypertension     Past Surgical History:  Procedure Laterality Date  . COLONOSCOPY  2015  . INSERTION OF MESH N/A 10/31/2017   Procedure: INSERTION OF MESH;  Surgeon: Coralie Keens, MD;  Location: [facility];  Service: General;  Laterality: N/A;  . UMBILICAL HERNIA REPAIR N/A 10/31/2017   Procedure: UMBILICAL HERNIA REPAIR WITH MESH;  Surgeon: Coralie Keens, MD;  Location: [facility];  Service: General;  Laterality: N/A;  . VASECTOMY  2006    Social History:  reports that he has never smoked. He has never used smokeless tobacco. He reports that he does not drink alcohol or use drugs.  No Known Allergies  Family History  Problem Relation Age of Onset  . Heart disease Father   . Stroke Father   . Hypertension Sister   . Heart disease Mother   . Colon cancer Neg Hx      Prior to Admission medications   Medication Sig Start Date End Date Taking? Authorizing Provider  amLODipine (NORVASC) 5 MG tablet Take 1 tablet (5 mg total) by mouth daily. 09/02/18   Jenna Luo  T, MD  aspirin 81 MG tablet Take 81 mg by mouth daily.    [provider]  atorvastatin (LIPITOR) 80 MG tablet TAKE 1 TABLET BY MOUTH AT BEDTIME 09/05/18   Susy Frizzle, MD  blood glucose meter kit and supplies KIT Dispense based on patient and insurance preference. Check fasting blood sugar each morning.  E11.65 08/19/15   Orlena Sheldon, PA-C  diclofenac (VOLTAREN) 75 MG EC tablet Take 1 tablet (75 mg total) by mouth 2 (two) times daily. 07/03/18    Susy Frizzle, MD  Dulaglutide (TRULICITY) 1.57 YO/3.7CH SOPN Inject 1.5 mg into the skin once a week. 08/15/18   Susy Frizzle, MD  famotidine (PEPCID AC) 10 MG chewable tablet Chew 10 mg by mouth as needed for heartburn.    [provider]  fluticasone (FLONASE) 50 MCG/ACT nasal spray Place 2 sprays into both nostrils daily. 05/08/14   Susy Frizzle, MD  JARDIANCE 25 MG TABS tablet TAKE 1 TABLET BY MOUTH EVERY DAY 02/13/19   Susy Frizzle, MD  Lancets 30G MISC 1 each by Does not apply route daily. 04/02/18   Susy Frizzle, MD  lisinopril-hydrochlorothiazide (ZESTORETIC) 20-12.5 MG tablet Take 2 tablets by mouth daily. 11/22/18   Susy Frizzle, MD  loratadine (CLARITIN) 10 MG tablet Take 10 mg by mouth daily as needed for allergies.    [provider]  Lysine 500 MG CAPS Take 1 capsule by mouth daily.    [provider]  metFORMIN (GLUCOPHAGE) 1000 MG tablet TAKE 1 TABLET BY MOUTH TWICE A DAY WITH MEALS 01/13/19   Susy Frizzle, MD  Multiple Vitamins-Minerals (MULTIVITAMIN PO) Take 1 tablet by mouth daily.     [provider]  ONE TOUCH ULTRA TEST test strip CHECK FASTING BLOOD SUGAR EACH MORNING 02/23/16   Dena Billet B, PA-C  sulfamethoxazole-trimethoprim (BACTRIM DS) 800-160 MG tablet Take 1 tablet by mouth 2 (two) times daily. 12/30/18   Susy Frizzle, MD  triamcinolone cream (KENALOG) 0.1 % APPLY ON THE SKIN TWICE A DAY AS NEEDED 12/13/17   [provider]    Physical Exam: Vitals:   05/26/19 2200 05/26/19 2252 05/26/19 2330 05/26/19 2345  BP: 132/73 137/82 133/90 (!) 149/76  Pulse: 90 91 94 90  Resp: (!) 33 (!) 21 (!) 35 (!) 40  Temp:      TempSrc:      SpO2: 92% 92% 91% 96%  Weight:      Height:       Constitutional: Resting supine in bed with head elevated, NAD, calm, comfortable Eyes: PERRL, lids and conjunctivae normal ENMT: Mucous membranes are moist. Posterior pharynx clear of any exudate or  lesions.Normal dentition.  Neck: normal, supple, no masses. Respiratory: clear to auscultation bilaterally, no wheezing, no crackles. Normal respiratory effort. No accessory muscle use.  Cardiovascular: Regular rate and rhythm, no murmurs / rubs / gallops. No extremity edema. 2+ pedal pulses. Abdomen: Mild upper abdominal tenderness, no masses palpated. No hepatosplenomegaly. Bowel sounds positive.  Musculoskeletal: no clubbing / cyanosis. No joint deformity upper and lower extremities. Good ROM, no contractures. Normal muscle tone.  Skin: Diaphoretic, flushed appearance.  No rashes, lesions, ulcers. No induration Neurologic: CN 2-12 grossly intact. Sensation intact, Strength 5/5 in all 4.  Psychiatric: Normal judgment and insight. Alert and oriented x 3. Normal mood.     Labs on Admission: I have personally reviewed following labs and imaging studies  CBC: Recent Labs  Lab 05/26/19  2133  WBC 9.4  NEUTROABS 7.4  HGB 16.3  HCT 49.2  MCV 84.7  PLT 081   Basic Metabolic Panel: Recent Labs  Lab 05/26/19 2211  NA 140  K 3.0*  CL 104  CO2 21*  GLUCOSE 147*  BUN 29*  CREATININE 1.13  CALCIUM 8.3*   GFR: Estimated Creatinine Clearance: 82.6 mL/min (by C-G formula based on SCr of 1.13 mg/dL). Liver Function Tests: Recent Labs  Lab 05/26/19 2211  AST 57*  ALT 49*  ALKPHOS 43  BILITOT 0.5  PROT 6.4*  ALBUMIN 3.0*   No results for input(s): LIPASE, AMYLASE in the last 168 hours. No results for input(s): AMMONIA in the last 168 hours. Coagulation Profile: No results for input(s): INR, PROTIME in the last 168 hours. Cardiac Enzymes: No results for input(s): CKTOTAL, CKMB, CKMBINDEX, TROPONINI in the last 168 hours. BNP (last 3 results) No results for input(s): PROBNP in the last 8760 hours. HbA1C: No results for input(s): HGBA1C in the last 72 hours. CBG: No results for input(s): GLUCAP in the last 168 hours. Lipid Profile: Recent Labs    05/26/19 2211  TRIG 77    Thyroid Function Tests: No results for input(s): TSH, T4TOTAL, FREET4, T3FREE, THYROIDAB in the last 72 hours. Anemia Panel: Recent Labs    05/26/19 2152  FERRITIN 561*   Urine analysis:    Component Value Date/Time   COLORURINE YELLOW 08/18/2015 1146   APPEARANCEUR CLEAR 08/18/2015 1146   LABSPEC 1.015 08/18/2015 1146   PHURINE 6.5 08/18/2015 1146   GLUCOSEU 2+ (A) 08/18/2015 1146   HGBUR NEGATIVE 08/18/2015 1146   BILIRUBINUR NEGATIVE 08/18/2015 1146   KETONESUR TRACE (A) 08/18/2015 1146   PROTEINUR 1+ (A) 08/18/2015 1146   NITRITE NEGATIVE 08/18/2015 1146   LEUKOCYTESUR NEGATIVE 08/18/2015 1146    Radiological Exams on Admission: DG Chest Port 1 View  Result Date: 05/26/2019 CLINICAL DATA:  Shortness of breath. EXAM: PORTABLE CHEST 1 VIEW COMPARISON:  None. FINDINGS: Mild infiltrates are seen within the mid lung fields, bilaterally. There is no evidence of a pleural effusion or pneumothorax. The heart size and mediastinal contours are within normal limits. The visualized skeletal structures are unremarkable. IMPRESSION: 1. Mild bilateral infiltrates. Electronically Signed   By: Virgina Norfolk M.D.   On: 05/26/2019 22:26    EKG: Independently reviewed. Sinus tachycardia, rate 107, PVCs present. When compared to prior PVCs are new.  Assessment/Plan Principal Problem:   Acute hypoxemic respiratory failure due to COVID-19 [facility]) Active Problems:   Hyperlipidemia associated with type 2 diabetes mellitus ([place])   Hypertension associated with diabetes ([place])   Diabetes mellitus, type 2 ([facility])   Hypokalemia  Joseph [name] is a 59 y.o. male with medical history significant for type 2 diabetes, hypertension, and hyperlipidemia who is admitted with acute respiratory failure with hypoxia due to COVID-19 viral pneumonia:  Acute respiratory failure with hypoxia due to COVID-19 pneumonia: Reported outpatient SARS-CoV-2 positive test May 21, 2019. SARS-CoV-2 antigen test is  positive here May 26, 2019. Chest x-ray shows bilateral midlung infiltrates. O2 saturation drops below 94% on room air and to mid 80s with ambulation per report. -Start IV remdesivir per pharmacy protocol -Start IV dexamethasone 6 mg daily -Continue supplemental oxygen as needed and wean down as able -Incentive spirometer, flutter valve, Combivent -Antitussives, vitamin C, zinc -Monitor daily inflammatory markers  Type 2 diabetes: Holding home regimen of Metformin, Jardiance, and Trulicity while in hospital. Start sensitive SSI and adjust as needed. Check A1c.  Hypertension:  Currently stable. Continue home amlodipine and lisinopril. Holding HCTZ component of home Zestoretic in setting of hypokalemia.  Hypokalemia: Replete orally. Check magnesium and replete as needed.  Hyperlipidemia: Continue atorvastatin.  DVT prophylaxis: Lovenox Code Status: Full code, confirmed with patient Family Communication: Discussed with patient, he has discussed with family Disposition Plan: Likely discharge to home pending clinical progress Consults called: None Admission status: Inpatient for management of acute respiratory failure with hypoxia due to COVID-19 viral pneumonia.   Zada Finders MD [facility]  If 7PM-7AM, please contact night-coverage [email or web address]  05/27/2019, 12:24 AM

## 2019-05-26 NOTE — ED Provider Notes (Signed)
[facility] EMERGENCY DEPARTMENT Provider Note   CSN: [id number] Arrival date & time: 05/26/19  1845     History Chief Complaint  Patient presents with  . Shortness of Breath    Joseph [name] is a 59 y.o. male.  Patient is a 59 year old male who presents with cough and hypoxia.  Joseph [name] has a history of diabetes, hypertension and hyperlipidemia.  Joseph [name] started having fevers and congestion 9 days ago.  Joseph [name] initially tested negative for Covid 1 week ago.  However this past Thursday which was 4 days ago, Joseph [name] tested positive.  Joseph [name] was having increased coughing so Joseph [name] went into urgent care today to get a chest x-ray and was found to be hypoxic with oxygen saturations in the mid 80s when Joseph [name] ambulates.  Joseph [name] had an x-ray which Joseph [name] was told had double pneumonia.  Joseph [name] came here for further evaluation.  Joseph [name] has some mild shortness of breath.  No leg swelling.  Has had some loose stools but no vomiting.  No abdominal pain other than some soreness from coughing.  Joseph [name] has some soreness in his chest from the coughing but no other associated chest pain.        Past Medical History:  Diagnosis Date  . Allergy   . Diabetes mellitus without complication ([place])   . Hyperlipidemia   . Hypertension     Patient Active Problem List   Diagnosis Date Noted  . Elevated LFTs 08/26/2015  . Diabetes mellitus, type 2 ([place]) 08/19/2015  . Hyperlipidemia   . Hypertension   . Allergy     Past Surgical History:  Procedure Laterality Date  . COLONOSCOPY  2015  . INSERTION OF MESH N/A 10/31/2017   Procedure: INSERTION OF MESH;  Surgeon: Coralie Keens, MD;  Location: [facility];  Service: General;  Laterality: N/A;  . UMBILICAL HERNIA REPAIR N/A 10/31/2017   Procedure: UMBILICAL HERNIA REPAIR WITH MESH;  Surgeon: Coralie Keens, MD;  Location: [facility];  Service: General;  Laterality: N/A;  . VASECTOMY  2006       Family History  Problem Relation Age of Onset  . Heart disease Father   . Stroke Father     . Hypertension Sister   . Heart disease Mother   . Colon cancer Neg Hx     Social History   Tobacco Use  . Smoking status: Never Smoker  . Smokeless tobacco: Never Used  Substance Use Topics  . Alcohol use: No  . Drug use: No    Home Medications Prior to Admission medications   Medication Sig Start Date End Date Taking? Authorizing Provider  amLODipine (NORVASC) 5 MG tablet Take 1 tablet (5 mg total) by mouth daily. 09/02/18   Susy Frizzle, MD  aspirin 81 MG tablet Take 81 mg by mouth daily.    [provider]  atorvastatin (LIPITOR) 80 MG tablet TAKE 1 TABLET BY MOUTH AT BEDTIME 09/05/18   Susy Frizzle, MD  blood glucose meter kit and supplies KIT Dispense based on patient and insurance preference. Check fasting blood sugar each morning.  E11.65 08/19/15   Orlena Sheldon, PA-C  diclofenac (VOLTAREN) 75 MG EC tablet Take 1 tablet (75 mg total) by mouth 2 (two) times daily. 07/03/18   Susy Frizzle, MD  Dulaglutide (TRULICITY) 1.5 SA/6.3KZ SOPN Inject 1.5 mg into the skin once a week. 08/15/18   Susy Frizzle, MD  famotidine (PEPCID AC) 10 MG chewable tablet Chew 10 mg by mouth as  needed for heartburn.    [provider]  fluticasone (FLONASE) 50 MCG/ACT nasal spray Place 2 sprays into both nostrils daily. 05/08/14   Susy Frizzle, MD  JARDIANCE 25 MG TABS tablet TAKE 1 TABLET BY MOUTH EVERY DAY 02/13/19   Susy Frizzle, MD  Lancets 30G MISC 1 each by Does not apply route daily. 04/02/18   Susy Frizzle, MD  lisinopril-hydrochlorothiazide (ZESTORETIC) 20-12.5 MG tablet Take 2 tablets by mouth daily. 11/22/18   Susy Frizzle, MD  loratadine (CLARITIN) 10 MG tablet Take 10 mg by mouth daily as needed for allergies.    [provider]  Lysine 500 MG CAPS Take 1 capsule by mouth daily.    [provider]  metFORMIN (GLUCOPHAGE) 1000 MG tablet TAKE 1 TABLET BY MOUTH TWICE A DAY WITH MEALS 01/13/19   Susy Frizzle, MD  Multiple  Vitamins-Minerals (MULTIVITAMIN PO) Take 1 tablet by mouth daily.     [provider]  ONE TOUCH ULTRA TEST test strip CHECK FASTING BLOOD SUGAR EACH MORNING 02/23/16   Dena Billet B, PA-C  sulfamethoxazole-trimethoprim (BACTRIM DS) 800-160 MG tablet Take 1 tablet by mouth 2 (two) times daily. 12/30/18   Susy Frizzle, MD  triamcinolone cream (KENALOG) 0.1 % APPLY ON THE SKIN TWICE A DAY AS NEEDED 12/13/17   [provider]    Allergies    Patient has no known allergies.  Review of Systems   Review of Systems  Constitutional: Positive for fatigue and fever. Negative for chills and diaphoresis.  HENT: Positive for rhinorrhea. Negative for congestion and sneezing.   Eyes: Negative.   Respiratory: Positive for cough and shortness of breath. Negative for chest tightness.   Cardiovascular: Positive for chest pain. Negative for leg swelling.  Gastrointestinal: Positive for diarrhea. Negative for abdominal pain, blood in stool, nausea and vomiting.  Genitourinary: Negative for difficulty urinating, flank pain, frequency and hematuria.  Musculoskeletal: Positive for myalgias. Negative for arthralgias and back pain.  Skin: Negative for rash.  Neurological: Negative for dizziness, speech difficulty, weakness, numbness and headaches.    Physical Exam Updated Vital Signs BP 137/82   Pulse 91   Temp 98.5 F (36.9 C) (Oral)   Resp (!) 21   Ht 5' 8"  (1.727 m)   Wt 97.5 kg   SpO2 92%   BMI 32.69 kg/m   Physical Exam Constitutional:      Appearance: Joseph [name] is well-developed.  HENT:     Head: Normocephalic and atraumatic.  Eyes:     Pupils: Pupils are equal, round, and reactive to light.  Cardiovascular:     Rate and Rhythm: Normal rate and regular rhythm.     Heart sounds: Normal heart sounds.  Pulmonary:     Effort: Pulmonary effort is normal. Tachypnea present. No respiratory distress.     Breath sounds: Normal breath sounds. No wheezing or rales.  Chest:     Chest  wall: No tenderness.  Abdominal:     General: Bowel sounds are normal.     Palpations: Abdomen is soft.     Tenderness: There is no abdominal tenderness. There is no guarding or rebound.  Musculoskeletal:        General: Normal range of motion.     Cervical back: Normal range of motion and neck supple.     Right lower leg: No edema.     Left lower leg: No edema.  Lymphadenopathy:     Cervical: No cervical adenopathy.  Skin:  General: Skin is warm and dry.     Findings: No rash.  Neurological:     Mental Status: Joseph [name] is alert and oriented to person, place, and time.     ED Results / Procedures / Treatments   Labs (all labs ordered are listed, but only abnormal results are displayed) Labs Reviewed  COMPREHENSIVE METABOLIC PANEL - Abnormal; Notable for the following components:      Result Value   Potassium 3.0 (*)    CO2 21 (*)    Glucose, Bld 147 (*)    BUN 29 (*)    Calcium 8.3 (*)    Total Protein 6.4 (*)    Albumin 3.0 (*)    AST 57 (*)    ALT 49 (*)    All other components within normal limits  LACTATE DEHYDROGENASE - Abnormal; Notable for the following components:   LDH 443 (*)    All other components within normal limits  POC SARS CORONAVIRUS 2 AG -  ED - Abnormal; Notable for the following components:   SARS Coronavirus 2 Ag POSITIVE (*)    All other components within normal limits  CULTURE, BLOOD (ROUTINE X 2)  CULTURE, BLOOD (ROUTINE X 2)  CBC WITH DIFFERENTIAL/PLATELET  TRIGLYCERIDES  LACTIC ACID, PLASMA  D-DIMER, QUANTITATIVE (NOT AT [facility])  FERRITIN  FIBRINOGEN  C-REACTIVE PROTEIN  PROCALCITONIN    EKG EKG Interpretation  Date/Time:  Monday May 26 2019 18:55:54 EST Ventricular Rate:  107 PR Interval:  142 QRS Duration: 104 QT Interval:  350 QTC Calculation: 467 R Axis:   80 Text Interpretation: Sinus tachycardia with frequent Premature ventricular complexes Otherwise normal ECG Confirmed by Malvin Johns [phone]) on 05/26/2019 10:40:59  PM   Radiology DG Chest Port 1 View  Result Date: 05/26/2019 CLINICAL DATA:  Shortness of breath. EXAM: PORTABLE CHEST 1 VIEW COMPARISON:  None. FINDINGS: Mild infiltrates are seen within the mid lung fields, bilaterally. There is no evidence of a pleural effusion or pneumothorax. The heart size and mediastinal contours are within normal limits. The visualized skeletal structures are unremarkable. IMPRESSION: 1. Mild bilateral infiltrates. Electronically Signed   By: Virgina Norfolk M.D.   On: 05/26/2019 22:26    Procedures Procedures (including critical care time)  Medications Ordered in ED Medications - No data to display  ED Course  I have reviewed the triage vital signs and the nursing notes.  Pertinent labs & imaging results that were available during my care of the patient were reviewed by me and considered in my medical decision making (see chart for details).    MDM Rules/Calculators/A&P                      Patient presents with worsening symptoms related to Covid infection.  Joseph [name] has some tachypnea and hypoxia.  Joseph [name] is requiring oxygen at 2 L/min.  His other vital signs are okay.  His chest x-ray shows bilateral patchy infiltrates.  Joseph [name] has mild hypokalemia on his labs.  I spoke with Dr. Posey Pronto who will admit the patient for further treatment.  Leighton Brickley was evaluated in Emergency Department on 05/26/2019 for the symptoms described in the history of present illness. Joseph [name] was evaluated in the context of the global COVID-19 pandemic, which necessitated consideration that the patient might be at risk for infection with the SARS-CoV-2 virus that causes COVID-19. Institutional protocols and algorithms that pertain to the evaluation of patients at risk for COVID-19 are in a state of rapid change based  on information released by regulatory bodies including the CDC and federal and state organizations. These policies and algorithms were followed during the patient's care in the ED.  Final  Clinical Impression(s) / ED Diagnoses Final diagnoses:  BTVDF-17 virus infection  Hypoxia    Rx / DC Orders ED Discharge Orders    None       Malvin Johns, MD 05/26/19 2325

## 2019-05-27 LAB — HEMOGLOBIN A1C
Hgb A1c MFr Bld: 8.4 % — ABNORMAL HIGH (ref 4.8–5.6)
Mean Plasma Glucose: 194.38 mg/dL

## 2019-05-27 LAB — GLUCOSE, CAPILLARY
Glucose-Capillary: 181 mg/dL — ABNORMAL HIGH (ref 70–99)
Glucose-Capillary: 174 mg/dL — ABNORMAL HIGH (ref 70–99)
Glucose-Capillary: 197 mg/dL — ABNORMAL HIGH (ref 70–99)
Glucose-Capillary: 150 mg/dL — ABNORMAL HIGH (ref 70–99)
Glucose-Capillary: 202 mg/dL — ABNORMAL HIGH (ref 70–99)

## 2019-05-27 LAB — ABO/RH: ABO/RH(D): A POS

## 2019-05-27 LAB — MAGNESIUM: Magnesium: 2.2 mg/dL (ref 1.7–2.4)

## 2019-05-27 LAB — HIV ANTIBODY (ROUTINE TESTING W REFLEX): HIV Screen 4th Generation wRfx: NONREACTIVE

## 2019-05-27 MED ORDER — MAGNESIUM OXIDE 400 (241.3 MG) MG PO TABS
800.0000 mg | ORAL_TABLET | Freq: Once | ORAL | Status: AC
  Administered 2019-05-27: 11:00:00 800 mg via ORAL
  Filled 2019-05-27: qty 2

## 2019-05-27 MED ORDER — ATORVASTATIN CALCIUM 80 MG PO TABS
80.0000 mg | ORAL_TABLET | Freq: Every day | ORAL | Status: DC
  Administered 2019-05-27: 80 mg via ORAL

## 2019-05-27 MED ORDER — POTASSIUM CHLORIDE CRYS ER 20 MEQ PO TBCR
40.0000 meq | EXTENDED_RELEASE_TABLET | Freq: Once | ORAL | Status: AC
  Administered 2019-05-27: 11:00:00 40 meq via ORAL
  Filled 2019-05-27: qty 2

## 2019-05-27 MED ORDER — ACETAMINOPHEN 325 MG PO TABS: 650.0000 mg | ORAL_TABLET | Freq: Four times a day (QID) | ORAL | Status: DC | PRN

## 2019-05-27 MED ORDER — LISINOPRIL 40 MG PO TABS
40.0000 mg | ORAL_TABLET | Freq: Every day | ORAL | Status: DC
  Administered 2019-05-27 – 2019-05-30 (×4): 40 mg via ORAL
  Filled 2019-05-27 (×4): qty 1

## 2019-05-27 MED ORDER — ONDANSETRON HCL 4 MG/2ML IJ SOLN
4.0000 mg | Freq: Four times a day (QID) | INTRAMUSCULAR | Status: DC | PRN
  Administered 2019-05-27: 4 mg via INTRAVENOUS
  Filled 2019-05-27: qty 2

## 2019-05-27 MED ORDER — IPRATROPIUM-ALBUTEROL 20-100 MCG/ACT IN AERS
1.0000 | INHALATION_SPRAY | Freq: Three times a day (TID) | RESPIRATORY_TRACT | Status: DC
  Administered 2019-05-27 – 2019-05-29 (×7): 1 via RESPIRATORY_TRACT
  Filled 2019-05-27: qty 4

## 2019-05-27 MED ORDER — ONDANSETRON HCL 4 MG PO TABS: 4.0000 mg | ORAL_TABLET | Freq: Four times a day (QID) | ORAL | Status: DC | PRN

## 2019-05-27 MED ORDER — ATORVASTATIN CALCIUM 40 MG PO TABS
40.0000 mg | ORAL_TABLET | Freq: Every day | ORAL | Status: DC
  Administered 2019-05-27 – 2019-06-01 (×6): 40 mg via ORAL
  Filled 2019-05-27 (×5): qty 1

## 2019-05-27 MED ORDER — IPRATROPIUM-ALBUTEROL 20-100 MCG/ACT IN AERS
1.0000 | INHALATION_SPRAY | Freq: Four times a day (QID) | RESPIRATORY_TRACT | Status: DC
  Administered 2019-05-27: 1 via RESPIRATORY_TRACT
  Filled 2019-05-27: qty 4

## 2019-05-27 MED ORDER — FAMOTIDINE 10 MG PO TABS
10.0000 mg | ORAL_TABLET | Freq: Two times a day (BID) | ORAL | Status: DC | PRN
  Administered 2019-05-28: 10 mg via ORAL
  Filled 2019-05-27 (×4): qty 1

## 2019-05-27 MED ORDER — ZINC SULFATE 220 (50 ZN) MG PO CAPS
220.0000 mg | ORAL_CAPSULE | Freq: Every day | ORAL | Status: DC
  Administered 2019-05-27 – 2019-06-02 (×7): 220 mg via ORAL
  Filled 2019-05-27 (×7): qty 1

## 2019-05-27 MED ORDER — LORATADINE 10 MG PO TABS: 10.0000 mg | ORAL_TABLET | Freq: Every day | ORAL | Status: DC | PRN

## 2019-05-27 MED ORDER — TOCILIZUMAB 400 MG/20ML IV SOLN
8.0000 mg/kg | Freq: Once | INTRAVENOUS | Status: AC
  Administered 2019-05-27: 792 mg via INTRAVENOUS
  Filled 2019-05-27: qty 39.6

## 2019-05-27 MED ORDER — POLYETHYLENE GLYCOL 3350 17 G PO PACK
17.0000 g | PACK | Freq: Every day | ORAL | Status: DC | PRN
  Administered 2019-06-01 – 2019-06-02 (×2): 17 g via ORAL
  Filled 2019-05-27 (×2): qty 1

## 2019-05-27 MED ORDER — ASCORBIC ACID 500 MG PO TABS
500.0000 mg | ORAL_TABLET | Freq: Every day | ORAL | Status: DC
  Administered 2019-05-27 – 2019-06-02 (×7): 500 mg via ORAL
  Filled 2019-05-27 (×7): qty 1

## 2019-05-27 MED ORDER — AMLODIPINE BESYLATE 5 MG PO TABS
5.0000 mg | ORAL_TABLET | Freq: Every day | ORAL | Status: DC
  Administered 2019-05-27 – 2019-05-29 (×3): 5 mg via ORAL
  Filled 2019-05-27 (×3): qty 1

## 2019-05-27 MED ORDER — DEXAMETHASONE SODIUM PHOSPHATE 10 MG/ML IJ SOLN
6.0000 mg | Freq: Every day | INTRAMUSCULAR | Status: DC
  Administered 2019-05-27 – 2019-06-02 (×7): 6 mg via INTRAVENOUS
  Filled 2019-05-27 (×6): qty 1

## 2019-05-27 MED ORDER — SODIUM CHLORIDE 0.9 % IV SOLN
200.0000 mg | Freq: Once | INTRAVENOUS | Status: AC
  Administered 2019-05-27: 01:00:00 200 mg via INTRAVENOUS
  Filled 2019-05-27: qty 200

## 2019-05-27 MED ORDER — POTASSIUM CHLORIDE 20 MEQ/15ML (10%) PO SOLN
40.0000 meq | Freq: Once | ORAL | Status: AC
  Administered 2019-05-27: 40 meq via ORAL
  Filled 2019-05-27: qty 30

## 2019-05-27 MED ORDER — LISINOPRIL-HYDROCHLOROTHIAZIDE 20-12.5 MG PO TABS: 2.0000 | ORAL_TABLET | Freq: Every day | ORAL | Status: DC

## 2019-05-27 MED ORDER — LINAGLIPTIN 5 MG PO TABS
5.0000 mg | ORAL_TABLET | Freq: Every day | ORAL | Status: DC
  Administered 2019-05-27 – 2019-06-02 (×7): 5 mg via ORAL
  Filled 2019-05-27 (×7): qty 1

## 2019-05-27 MED ORDER — GUAIFENESIN-DM 100-10 MG/5ML PO SYRP
10.0000 mL | ORAL_SOLUTION | ORAL | Status: DC | PRN
  Administered 2019-05-28 – 2019-05-29 (×3): 10 mL via ORAL
  Filled 2019-05-27 (×3): qty 10

## 2019-05-27 MED ORDER — INSULIN ASPART 100 UNIT/ML ~~LOC~~ SOLN
0.0000 [IU] | Freq: Three times a day (TID) | SUBCUTANEOUS | Status: DC
  Administered 2019-05-27: 2 [IU] via SUBCUTANEOUS
  Administered 2019-05-27: 1 [IU] via SUBCUTANEOUS
  Administered 2019-05-27 – 2019-05-29 (×5): 2 [IU] via SUBCUTANEOUS
  Administered 2019-05-29 (×2): 3 [IU] via SUBCUTANEOUS
  Administered 2019-05-30: 5 [IU] via SUBCUTANEOUS
  Administered 2019-05-30: 2 [IU] via SUBCUTANEOUS
  Administered 2019-05-30 – 2019-05-31 (×2): 3 [IU] via SUBCUTANEOUS
  Administered 2019-05-31: 5 [IU] via SUBCUTANEOUS
  Administered 2019-05-31 – 2019-06-01 (×2): 2 [IU] via SUBCUTANEOUS
  Administered 2019-06-01: 5 [IU] via SUBCUTANEOUS
  Administered 2019-06-01: 3 [IU] via SUBCUTANEOUS
  Administered 2019-06-02: 09:00:00 2 [IU] via SUBCUTANEOUS
  Administered 2019-06-02: 12:00:00 5 [IU] via SUBCUTANEOUS

## 2019-05-27 MED ORDER — LIP MEDEX EX OINT
1.0000 "application " | TOPICAL_OINTMENT | CUTANEOUS | Status: DC | PRN
  Filled 2019-05-27: qty 7

## 2019-05-27 MED ORDER — ENOXAPARIN SODIUM 40 MG/0.4ML ~~LOC~~ SOLN
40.0000 mg | Freq: Every day | SUBCUTANEOUS | Status: DC
  Administered 2019-05-27 – 2019-06-01 (×7): 40 mg via SUBCUTANEOUS
  Filled 2019-05-27 (×7): qty 0.4

## 2019-05-27 MED ORDER — SENNOSIDES-DOCUSATE SODIUM 8.6-50 MG PO TABS: 2.0000 | ORAL_TABLET | Freq: Every evening | ORAL | Status: DC | PRN

## 2019-05-27 MED ORDER — HYDROCHLOROTHIAZIDE 25 MG PO TABS: 25.0000 mg | ORAL_TABLET | Freq: Every day | ORAL | Status: DC

## 2019-05-27 MED ORDER — HYDROCOD POLST-CPM POLST ER 10-8 MG/5ML PO SUER: 5.0000 mL | Freq: Two times a day (BID) | ORAL | Status: DC | PRN

## 2019-05-27 MED ORDER — FLUTICASONE PROPIONATE 50 MCG/ACT NA SUSP
2.0000 | Freq: Every day | NASAL | Status: DC
  Administered 2019-05-27 – 2019-06-02 (×7): 2 via NASAL
  Filled 2019-05-27: qty 16

## 2019-05-27 MED ORDER — ASPIRIN EC 81 MG PO TBEC
81.0000 mg | DELAYED_RELEASE_TABLET | Freq: Every day | ORAL | Status: DC
  Administered 2019-05-27 – 2019-06-02 (×7): 81 mg via ORAL
  Filled 2019-05-27 (×7): qty 1

## 2019-05-27 MED ORDER — SODIUM CHLORIDE 0.9 % IV SOLN
100.0000 mg | Freq: Every day | INTRAVENOUS | Status: AC
  Administered 2019-05-28 – 2019-05-31 (×4): 100 mg via INTRAVENOUS
  Filled 2019-05-27 (×4): qty 20

## 2019-05-27 NOTE — Progress Notes (Signed)
PROGRESS NOTE    Joseph [name]  [id number] DOB: 10/20/1960 DOA: 05/26/2019 PCP: Susy Frizzle, MD   Brief Narrative:  59 year old with history of DM2, HTN, HLD comes to the hospital with complaints of cough, dyspnea and pneumonia.  Went to urgent care with the symptoms on 1/17, COVID-19 was negative and he was placed on doxycycline.  Then again started having diarrhea and went back to urgent care on 1/20 and the testing was positive for COVID-19 that day.  No worsening of the symptoms on 1/25 peripheral return back to urgent care on 1/25, chest x-ray showed infiltrates therefore sent to ER.   Assessment & Plan:   Principal Problem:   Acute hypoxemic respiratory failure due to COVID-19 [facility]) Active Problems:   Hyperlipidemia associated with type 2 diabetes mellitus ([place])   Hypertension associated with diabetes ([place])   Diabetes mellitus, type 2 (HCC)   Hypokalemia  Acute hypoxic respiratory distress secondary to COVID-19 pneumonia -Oxygen levels-5L [place] -Remdesivir-D2. Actemra 1/26 -Decadrone-D2 -Routine: Labs have been reviewed including ferritin, LDH, CRP, d-dimer, fibrinogen.  Will need to trend this lab daily. -Vitamin C & Zinc. Prone >16hrs/day.  -procalcitonin- -Chest x-ray-bibasilar infiltrate -Supportive care-antitussive, inhalers, I-S/flutter -CODE STATUS confirmed  Had extensive discussion with the patient regarding off label use of Actemra  given worsening of shortness of breath, oxygen requirement from hypoxia and elevation in inflammatory markers.  Denies any active/latent tuberculosis, hepatitis infection, active immunosuppressive state including chemotherapy.  No severe sepsis.  Patient and family understood and all the questions answered.  Agreed to proceed with it.  Diabetes mellitus type 2 -A1c 8.4.  Home medications on hold -Insulin sliding scale Accu-Chek.  Start linagliptin 5 mg daily  Essential hypertension -Norvasc 5 mg daily.  Lisinopril 40 mg  daily  Hyperlipidemia -Lipitor. -Daily aspirin   DVT prophylaxis: Lovenox Code Status: Full code Family Communication: Spoke with the patient's girlfriend Abigail Butts per his request on [id number] Disposition Plan: Maintain hospital stay as he still requiring 5 to 6 L nasal cannula.  He is not on any home oxygen  Consultants:   None  Procedures:   None  Antimicrobials:   Remdesivir   Subjective: At rest he feels okay but still requiring 5-6 L nasal cannula.  With ambulation his oxygen levels dropped down to 80% on 6 L.  He has to pause in between to catch his breath.  Overall no complaints  Review of Systems Otherwise negative except as per HPI, including: General: Denies fever, chills, night sweats or unintended weight loss. Resp: Denies hemoptysis Cardiac: Denies chest pain, palpitations, orthopnea, paroxysmal nocturnal dyspnea. GI: Denies abdominal pain, nausea, vomiting, diarrhea or constipation GU: Denies dysuria, frequency, hesitancy or incontinence MS: Denies muscle aches, joint pain or swelling Neuro: Denies headache, neurologic deficits (focal weakness, numbness, tingling), abnormal gait Psych: Denies anxiety, depression, SI/HI/AVH Skin: Denies new rashes or lesions ID: Denies sick contacts, exotic exposures, travel  Objective: Vitals:   05/27/19 0200 05/27/19 0449 05/27/19 0454 05/27/19 0600  BP:   121/79   Pulse:   88   Resp: 20 (!) 33 20 18  Temp:   98.5 F (36.9 C)   TempSrc:   Oral   SpO2:   (!) 89%   Weight:  99 kg    Height:  5\' 8"  (1.727 m)      Intake/Output Summary (Last 24 hours) at 05/27/2019 0908 Last data filed at 05/27/2019 0616 Gross per 24 hour  Intake 250 ml  Output 850 ml  Net -600  ml   Filed Weights   05/26/19 1940 05/27/19 0449  Weight: 97.5 kg 99 kg    Examination:  General exam: Appears calm and comfortable  Respiratory system: Diffuse rhonchi Cardiovascular system: S1 & S2 heard, RRR. No JVD, murmurs, rubs, gallops or  clicks. No pedal edema. Gastrointestinal system: Abdomen is nondistended, soft and nontender. No organomegaly or masses felt. Normal bowel sounds heard. Central nervous system: Alert and oriented. No focal neurological deficits. Extremities: Symmetric 5 x 5 power. Skin: No rashes, lesions or ulcers Psychiatry: Judgement and insight appear normal. Mood & affect appropriate.   Data Reviewed:   CBC: Recent Labs  Lab 05/26/19 2133  WBC 9.4  NEUTROABS 7.4  HGB 16.3  HCT 49.2  MCV 84.7  PLT [id number]   Basic Metabolic Panel: Recent Labs  Lab 05/26/19 2211 05/27/19 0534  NA 140  --   K 3.0*  --   CL 104  --   CO2 21*  --   GLUCOSE 147*  --   BUN 29*  --   CREATININE 1.13  --   CALCIUM 8.3*  --   MG  --  2.2   GFR: Estimated Creatinine Clearance: 83.2 mL/min (by C-G formula based on SCr of 1.13 mg/dL). Liver Function Tests: Recent Labs  Lab 05/26/19 2211  AST 57*  ALT 49*  ALKPHOS 43  BILITOT 0.5  PROT 6.4*  ALBUMIN 3.0*   No results for input(s): LIPASE, AMYLASE in the last 168 hours. No results for input(s): AMMONIA in the last 168 hours. Coagulation Profile: No results for input(s): INR, PROTIME in the last 168 hours. Cardiac Enzymes: No results for input(s): CKTOTAL, CKMB, CKMBINDEX, TROPONINI in the last 168 hours. BNP (last 3 results) No results for input(s): PROBNP in the last 8760 hours. HbA1C: Recent Labs    05/27/19 0534  HGBA1C 8.4*   CBG: Recent Labs  Lab 05/27/19 0114 05/27/19 0752  GLUCAP 181* 174*   Lipid Profile: Recent Labs    05/26/19 2211  TRIG 77   Thyroid Function Tests: No results for input(s): TSH, T4TOTAL, FREET4, T3FREE, THYROIDAB in the last 72 hours. Anemia Panel: Recent Labs    05/26/19 2152  FERRITIN 561*   Sepsis Labs: Recent Labs  Lab 05/26/19 2211 05/26/19 2253  PROCALCITON 0.10  --   LATICACIDVEN  --  1.6    No results found for this or any previous visit (from the past 240 hour(s)).       Radiology  Studies: DG Chest Port 1 View  Result Date: 05/26/2019 CLINICAL DATA:  Shortness of breath. EXAM: PORTABLE CHEST 1 VIEW COMPARISON:  None. FINDINGS: Mild infiltrates are seen within the mid lung fields, bilaterally. There is no evidence of a pleural effusion or pneumothorax. The heart size and mediastinal contours are within normal limits. The visualized skeletal structures are unremarkable. IMPRESSION: 1. Mild bilateral infiltrates. Electronically Signed   By: Virgina Norfolk M.D.   On: 05/26/2019 22:26        Scheduled Meds: . amLODipine  5 mg Oral Daily  . vitamin C  500 mg Oral Daily  . atorvastatin  40 mg Oral QHS  . dexamethasone (DECADRON) injection  6 mg Intravenous Q0600  . enoxaparin (LOVENOX) injection  40 mg Subcutaneous QHS  . fluticasone  2 spray Each Nare Daily  . insulin aspart  0-9 Units Subcutaneous TID WC  . Ipratropium-Albuterol  1 puff Inhalation Q6H  . lisinopril  40 mg Oral Daily  . zinc sulfate  220 mg Oral Daily   Continuous Infusions: . [START ON 05/28/2019] remdesivir 100 mg in NS 100 mL       LOS: 1 day   Time spent= 35 mins    Yazmen Briones Arsenio Loader, MD [facility]  If 7PM-7AM, please contact night-coverage  05/27/2019, 9:08 AM

## 2019-05-27 NOTE — Plan of Care (Signed)
Patient admitted onto unit oriented to room. Family notification complete. Skin assessment completed with two nurses and there are no skin issues noted. Patient is alert and oriented x4. Denies pain at this time. Given IV zofran as needed for nausea.  Patient is resting at this time. Will continue to monitor.

## 2019-05-28 LAB — CBC WITH DIFFERENTIAL/PLATELET
Abs Immature Granulocytes: 0.02 10*3/uL (ref 0.00–0.07)
Basophils Absolute: 0 10*3/uL (ref 0.0–0.1)
Basophils Relative: 0 %
Eosinophils Absolute: 0 10*3/uL (ref 0.0–0.5)
Eosinophils Relative: 0 %
HCT: 47.2 % (ref 39.0–52.0)
Hemoglobin: 15.8 g/dL (ref 13.0–17.0)
Immature Granulocytes: 0 %
Lymphocytes Relative: 21 %
Lymphs Abs: 1.5 10*3/uL (ref 0.7–4.0)
MCH: 28.2 pg (ref 26.0–34.0)
MCHC: 33.5 g/dL (ref 30.0–36.0)
MCV: 84.1 fL (ref 80.0–100.0)
Monocytes Absolute: 0.5 10*3/uL (ref 0.1–1.0)
Monocytes Relative: 6 %
Neutro Abs: 5 10*3/uL (ref 1.7–7.7)
Neutrophils Relative %: 73 %
Platelets: 268 10*3/uL (ref 150–400)
RBC: 5.61 MIL/uL (ref 4.22–5.81)
RDW: 15.3 % (ref 11.5–15.5)
WBC: 7.2 10*3/uL (ref 4.0–10.5)
nRBC: 0 % (ref 0.0–0.2)

## 2019-05-28 LAB — COMPREHENSIVE METABOLIC PANEL
ALT: 40 U/L (ref 0–44)
AST: 53 U/L — ABNORMAL HIGH (ref 15–41)
Albumin: 2.5 g/dL — ABNORMAL LOW (ref 3.5–5.0)
Alkaline Phosphatase: 38 U/L (ref 38–126)
Anion gap: 10 (ref 5–15)
BUN: 35 mg/dL — ABNORMAL HIGH (ref 6–20)
CO2: 27 mmol/L (ref 22–32)
Calcium: 8.4 mg/dL — ABNORMAL LOW (ref 8.9–10.3)
Chloride: 105 mmol/L (ref 98–111)
Creatinine, Ser: 1.13 mg/dL (ref 0.61–1.24)
GFR calc Af Amer: >60 mL/min (ref >60)
GFR calc non Af Amer: >60 mL/min (ref >60)
Glucose, Bld: 159 mg/dL — ABNORMAL HIGH (ref 70–99)
Potassium: 3.3 mmol/L — ABNORMAL LOW (ref 3.5–5.1)
Sodium: 142 mmol/L (ref 135–145)
Total Bilirubin: 0.5 mg/dL (ref 0.3–1.2)
Total Protein: 5.8 g/dL — ABNORMAL LOW (ref 6.5–8.1)

## 2019-05-28 LAB — GLUCOSE, CAPILLARY
Glucose-Capillary: 158 mg/dL — ABNORMAL HIGH (ref 70–99)
Glucose-Capillary: 179 mg/dL — ABNORMAL HIGH (ref 70–99)
Glucose-Capillary: 182 mg/dL — ABNORMAL HIGH (ref 70–99)
Glucose-Capillary: 182 mg/dL — ABNORMAL HIGH (ref 70–99)

## 2019-05-28 LAB — FERRITIN: Ferritin: 577 ng/mL — ABNORMAL HIGH (ref 24–336)

## 2019-05-28 LAB — D-DIMER, QUANTITATIVE: D-Dimer, Quant: 0.59 ug/mL-FEU — ABNORMAL HIGH (ref 0.00–0.50)

## 2019-05-28 LAB — PHOSPHORUS: Phosphorus: 3 mg/dL (ref 2.5–4.6)

## 2019-05-28 LAB — MAGNESIUM: Magnesium: 2.8 mg/dL — ABNORMAL HIGH (ref 1.7–2.4)

## 2019-05-28 LAB — BRAIN NATRIURETIC PEPTIDE: B Natriuretic Peptide: 798.6 pg/mL — ABNORMAL HIGH (ref 0.0–100.0)

## 2019-05-28 LAB — C-REACTIVE PROTEIN: CRP: 8.4 mg/dL — ABNORMAL HIGH (ref <1.0)

## 2019-05-28 MED ORDER — ALUM & MAG HYDROXIDE-SIMETH 200-200-20 MG/5ML PO SUSP: 30.0000 mL | Freq: Four times a day (QID) | ORAL | Status: DC | PRN

## 2019-05-28 MED ORDER — POTASSIUM CHLORIDE CRYS ER 20 MEQ PO TBCR
30.0000 meq | EXTENDED_RELEASE_TABLET | Freq: Once | ORAL | Status: AC
  Administered 2019-05-28: 30 meq via ORAL
  Filled 2019-05-28: qty 1

## 2019-05-28 MED ORDER — POTASSIUM CHLORIDE CRYS ER 20 MEQ PO TBCR
40.0000 meq | EXTENDED_RELEASE_TABLET | Freq: Once | ORAL | Status: AC
  Administered 2019-05-28: 40 meq via ORAL
  Filled 2019-05-28: qty 2

## 2019-05-28 MED ORDER — FUROSEMIDE 10 MG/ML IJ SOLN
40.0000 mg | Freq: Four times a day (QID) | INTRAMUSCULAR | Status: AC
  Administered 2019-05-28 (×2): 40 mg via INTRAVENOUS
  Filled 2019-05-28 (×2): qty 4

## 2019-05-28 NOTE — Progress Notes (Addendum)
PROGRESS NOTE    Joseph [name]  [id number] DOB: Jul 18, 1960 DOA: 05/26/2019 PCP: Susy Frizzle, MD   Brief Narrative:  59 year old with history of DM2, HTN, HLD comes to the hospital with complaints of cough, dyspnea and pneumonia.  Went to urgent care with the symptoms on 1/17, COVID-19 was negative and he was placed on doxycycline.  Then again started having diarrhea and went back to urgent care on 1/20 and the testing was positive for COVID-19 that day.  No worsening of the symptoms on 1/25 peripheral return back to urgent care on 1/25, chest x-ray showed infiltrates therefore sent to ER.  In the hospital started on remdesivir and Decadron, given a dose of Actemra on 1/26.   Assessment & Plan:   Principal Problem:   Acute hypoxemic respiratory failure due to COVID-19 [facility]) Active Problems:   Hyperlipidemia associated with type 2 diabetes mellitus ([place])   Hypertension associated with diabetes ([place])   Diabetes mellitus, type 2 (HCC)   Hypokalemia  Acute hypoxic respiratory distress secondary to COVID-19 pneumonia -Oxygen levels-improved to 4 L nasal cannula -Remdesivir-D3. Actemra 1/26-tolerated well -Decadrone-D3 -Routine: Labs have been reviewed including ferritin, LDH, CRP, d-dimer, fibrinogen.  Will need to trend this lab daily. -Vitamin C & Zinc. Prone >16hrs/day.  -procalcitonin- -Chest x-ray-bibasilar infiltrate -Supportive care-antitussive, inhalers, I-S/flutter -CODE STATUS confirmed -Slightly elevated BNP, give 40 mg IV Lasix X 2 doses  Diabetes mellitus type 2 -A1c 8.4.  Home medications on hold -Insulin sliding scale Accu-Chek.  Linagliptin 5 mg orally daily  Essential hypertension -Norvasc 5 mg daily.  Lisinopril 40 mg daily  Hyperlipidemia -Lipitor. -Daily aspirin   DVT prophylaxis: Lovenox Code Status: Full code Family Communication: None at bedside.  His girlfriend was updated yesterday. Disposition Plan: Still on 4 L nasal cannula, not on any  home oxygen.  Maintain hospital stay until his oxygen levels have improved. Consultants:   None  Procedures:   None  Antimicrobials:   Remdesivir   Subjective: Still having some exertional dyspnea but overall slightly feels better.  On 4 L nasal cannula this morning saturating about 89%.  Review of Systems Otherwise negative except as per HPI, including: General = no fevers, chills, dizziness,  fatigue HEENT/EYES = negative for loss of vision, double vision, blurred vision,  sore throa Cardiovascular= negative for chest pain, palpitation Respiratory/lungs= negative for  wheezing; hemoptysis,  Gastrointestinal= negative for nausea, vomiting, abdominal pain Genitourinary= negative for Dysuria MSK = Negative for arthralgia, myalgias Neurology= Negative for headache, numbness, tingling  Psychiatry= Negative for suicidal and homocidal ideation Skin= Negative for Rash   Objective: Vitals:   05/27/19 0600 05/27/19 1203 05/27/19 2115 05/28/19 0431  BP:  116/74 95/63 98/70   Pulse:  83 71 75  Resp: 18 18 18 18   Temp:  98.3 F (36.8 C) 98.4 F (36.9 C) 98.2 F (36.8 C)  TempSrc:  Oral Oral Oral  SpO2:  94% 93% 91%  Weight:      Height:        Intake/Output Summary (Last 24 hours) at 05/28/2019 1143 Last data filed at 05/28/2019 0300 Gross per 24 hour  Intake 440 ml  Output --  Net 440 ml   Filed Weights   05/26/19 1940 05/27/19 0449  Weight: 97.5 kg 99 kg    Examination:  Constitutional: Not in acute distress Respiratory: Bilateral mild rhonchi Cardiovascular: Normal sinus rhythm, no rubs Abdomen: Nontender nondistended good bowel sounds Musculoskeletal: No edema noted Skin: No rashes seen Neurologic: CN 2-12 grossly intact.  And nonfocal Psychiatric: Normal judgment and insight. Alert and oriented x 3. Normal mood.   Data Reviewed:   CBC: Recent Labs  Lab 05/26/19 2133 05/28/19 0349  WBC 9.4 7.2  NEUTROABS 7.4 5.0  HGB 16.3 15.8  HCT 49.2 47.2  MCV  84.7 84.1  PLT 208 [id number]   Basic Metabolic Panel: Recent Labs  Lab 05/26/19 2211 05/27/19 0534 05/28/19 0349  NA 140  --  142  K 3.0*  --  3.3*  CL 104  --  105  CO2 21*  --  27  GLUCOSE 147*  --  159*  BUN 29*  --  35*  CREATININE 1.13  --  1.13  CALCIUM 8.3*  --  8.4*  MG  --  2.2 2.8*  PHOS  --   --  3.0   GFR: Estimated Creatinine Clearance: 83.2 mL/min (by C-G formula based on SCr of 1.13 mg/dL). Liver Function Tests: Recent Labs  Lab 05/26/19 2211 05/28/19 0349  AST 57* 53*  ALT 49* 40  ALKPHOS 43 38  BILITOT 0.5 0.5  PROT 6.4* 5.8*  ALBUMIN 3.0* 2.5*   No results for input(s): LIPASE, AMYLASE in the last 168 hours. No results for input(s): AMMONIA in the last 168 hours. Coagulation Profile: No results for input(s): INR, PROTIME in the last 168 hours. Cardiac Enzymes: No results for input(s): CKTOTAL, CKMB, CKMBINDEX, TROPONINI in the last 168 hours. BNP (last 3 results) No results for input(s): PROBNP in the last 8760 hours. HbA1C: Recent Labs    05/27/19 0534  HGBA1C 8.4*   CBG: Recent Labs  Lab 05/27/19 1149 05/27/19 1647 05/27/19 2112 05/28/19 0756 05/28/19 1141  GLUCAP 197* 150* 202* 158* 179*   Lipid Profile: Recent Labs    05/26/19 2211  TRIG 77   Thyroid Function Tests: No results for input(s): TSH, T4TOTAL, FREET4, T3FREE, THYROIDAB in the last 72 hours. Anemia Panel: Recent Labs    05/26/19 2152 05/28/19 0349  FERRITIN 561* 577*   Sepsis Labs: Recent Labs  Lab 05/26/19 2211 05/26/19 2253  PROCALCITON 0.10  --   LATICACIDVEN  --  1.6    Recent Results (from the past 240 hour(s))  Blood Culture (routine x 2)     Status: None (Preliminary result)   Collection Time: 05/26/19 10:53 PM   Specimen: BLOOD  Result Value Ref Range Status   Specimen Description BLOOD LEFT ANTECUBITAL  Final   Special Requests   Final    BOTTLES DRAWN AEROBIC ONLY Blood Culture results may not be optimal due to an inadequate volume of blood  received in culture bottles   Culture   Final    NO GROWTH 2 DAYS Performed at [facility], [place] [address] [place], [place] [address]    Report Status PENDING  Incomplete  Blood Culture (routine x 2)     Status: None (Preliminary result)   Collection Time: 05/26/19 10:55 PM   Specimen: BLOOD  Result Value Ref Range Status   Specimen Description BLOOD RIGHT ANTECUBITAL  Final   Special Requests   Final    BOTTLES DRAWN AEROBIC AND ANAEROBIC Blood Culture adequate volume   Culture   Final    NO GROWTH 2 DAYS Performed at [facility], [place] [address] [place], [place] [address]    Report Status PENDING  Incomplete         Radiology Studies: DG Chest Port 1 View  Result Date: 05/26/2019 CLINICAL DATA:  Shortness of breath.  EXAM: PORTABLE CHEST 1 VIEW COMPARISON:  None. FINDINGS: Mild infiltrates are seen within the mid lung fields, bilaterally. There is no evidence of a pleural effusion or pneumothorax. The heart size and mediastinal contours are within normal limits. The visualized skeletal structures are unremarkable. IMPRESSION: 1. Mild bilateral infiltrates. Electronically Signed   By: Virgina Norfolk M.D.   On: 05/26/2019 22:26        Scheduled Meds: . amLODipine  5 mg Oral Daily  . vitamin C  500 mg Oral Daily  . aspirin EC  81 mg Oral Daily  . atorvastatin  40 mg Oral QHS  . dexamethasone (DECADRON) injection  6 mg Intravenous Q0600  . enoxaparin (LOVENOX) injection  40 mg Subcutaneous QHS  . fluticasone  2 spray Each Nare Daily  . furosemide  40 mg Intravenous Q6H  . insulin aspart  0-9 Units Subcutaneous TID WC  . Ipratropium-Albuterol  1 puff Inhalation TID  . linagliptin  5 mg Oral Daily  . lisinopril  40 mg Oral Daily  . zinc sulfate  220 mg Oral Daily   Continuous Infusions: . remdesivir 100 mg in NS 100 mL 100 mg (05/28/19 0832)     LOS: 2 days   Time spent= 35 mins    Cassadee Vanzandt Arsenio Loader, MD [facility]  If 7PM-7AM, please  contact night-coverage  05/28/2019, 11:43 AM

## 2019-05-28 NOTE — Progress Notes (Signed)
Offered to call and update family member.  Patient said not to because he is in constant communication with family.

## 2019-05-29 LAB — COMPREHENSIVE METABOLIC PANEL
ALT: 41 U/L (ref 0–44)
AST: 59 U/L — ABNORMAL HIGH (ref 15–41)
Albumin: 2.8 g/dL — ABNORMAL LOW (ref 3.5–5.0)
Alkaline Phosphatase: 43 U/L (ref 38–126)
Anion gap: 15 (ref 5–15)
BUN: 37 mg/dL — ABNORMAL HIGH (ref 6–20)
CO2: 25 mmol/L (ref 22–32)
Calcium: 8.2 mg/dL — ABNORMAL LOW (ref 8.9–10.3)
Chloride: 103 mmol/L (ref 98–111)
Creatinine, Ser: 1.13 mg/dL (ref 0.61–1.24)
GFR calc Af Amer: >60 mL/min (ref >60)
GFR calc non Af Amer: >60 mL/min (ref >60)
Glucose, Bld: 162 mg/dL — ABNORMAL HIGH (ref 70–99)
Potassium: 3.5 mmol/L (ref 3.5–5.1)
Sodium: 143 mmol/L (ref 135–145)
Total Bilirubin: 1 mg/dL (ref 0.3–1.2)
Total Protein: 6.1 g/dL — ABNORMAL LOW (ref 6.5–8.1)

## 2019-05-29 LAB — CBC WITH DIFFERENTIAL/PLATELET
Abs Immature Granulocytes: 0.03 10*3/uL (ref 0.00–0.07)
Basophils Absolute: 0 10*3/uL (ref 0.0–0.1)
Basophils Relative: 0 %
Eosinophils Absolute: 0 10*3/uL (ref 0.0–0.5)
Eosinophils Relative: 0 %
HCT: 50.1 % (ref 39.0–52.0)
Hemoglobin: 16.5 g/dL (ref 13.0–17.0)
Immature Granulocytes: 0 %
Lymphocytes Relative: 17 %
Lymphs Abs: 1.3 10*3/uL (ref 0.7–4.0)
MCH: 27.9 pg (ref 26.0–34.0)
MCHC: 32.9 g/dL (ref 30.0–36.0)
MCV: 84.6 fL (ref 80.0–100.0)
Monocytes Absolute: 0.4 10*3/uL (ref 0.1–1.0)
Monocytes Relative: 5 %
Neutro Abs: 6 10*3/uL (ref 1.7–7.7)
Neutrophils Relative %: 78 %
Platelets: 323 10*3/uL (ref 150–400)
RBC: 5.92 MIL/uL — ABNORMAL HIGH (ref 4.22–5.81)
RDW: 15.3 % (ref 11.5–15.5)
WBC: 7.8 10*3/uL (ref 4.0–10.5)
nRBC: 0 % (ref 0.0–0.2)

## 2019-05-29 LAB — GLUCOSE, CAPILLARY
Glucose-Capillary: 167 mg/dL — ABNORMAL HIGH (ref 70–99)
Glucose-Capillary: 207 mg/dL — ABNORMAL HIGH (ref 70–99)
Glucose-Capillary: 214 mg/dL — ABNORMAL HIGH (ref 70–99)
Glucose-Capillary: 190 mg/dL — ABNORMAL HIGH (ref 70–99)

## 2019-05-29 LAB — PHOSPHORUS: Phosphorus: 3.1 mg/dL (ref 2.5–4.6)

## 2019-05-29 LAB — D-DIMER, QUANTITATIVE: D-Dimer, Quant: 0.81 ug/mL-FEU — ABNORMAL HIGH (ref 0.00–0.50)

## 2019-05-29 LAB — FERRITIN: Ferritin: 492 ng/mL — ABNORMAL HIGH (ref 24–336)

## 2019-05-29 LAB — MAGNESIUM: Magnesium: 2.3 mg/dL (ref 1.7–2.4)

## 2019-05-29 LAB — C-REACTIVE PROTEIN: CRP: 3.9 mg/dL — ABNORMAL HIGH (ref <1.0)

## 2019-05-29 MED ORDER — VITAMIN D 25 MCG (1000 UNIT) PO TABS
1000.0000 [IU] | ORAL_TABLET | Freq: Every day | ORAL | Status: DC
  Administered 2019-05-29 – 2019-06-02 (×5): 1000 [IU] via ORAL
  Filled 2019-05-29 (×5): qty 1

## 2019-05-29 MED ORDER — IPRATROPIUM-ALBUTEROL 20-100 MCG/ACT IN AERS
2.0000 | INHALATION_SPRAY | Freq: Three times a day (TID) | RESPIRATORY_TRACT | Status: DC
  Administered 2019-05-29 – 2019-06-02 (×11): 2 via RESPIRATORY_TRACT

## 2019-05-29 MED ORDER — FUROSEMIDE 20 MG PO TABS
20.0000 mg | ORAL_TABLET | Freq: Every day | ORAL | Status: DC
  Administered 2019-05-29: 20 mg via ORAL
  Filled 2019-05-29: qty 1

## 2019-05-29 NOTE — Progress Notes (Signed)
PROGRESS NOTE  Joseph [name] [id number] DOB: [birth year] DOA: 05/26/2019 PCP: Susy Frizzle, MD  HPI/Recap of past 14 hours: 59 year old with history of DM2, HTN, HLD comes to the hospital with complaints of cough, dyspnea and pneumonia.  Went to urgent care with the symptoms on 1/17, COVID-19 was negative and he was placed on doxycycline.  Then again started having diarrhea and went back to urgent care on 1/20 and the testing was positive for COVID-19 that day.  No worsening of the symptoms on 1/25 peripheral return back to urgent care on 1/25, chest x-ray showed infiltrates therefore sent to ER.  In the hospital started on remdesivir and Decadron, given a dose of Actemra on 1/26.  05/29/19: Seen and examined.  Reports persistent non productive cough.  He denies any chest pain.  On 4 L nasal cannula with O2 saturation in the low 90s.  Not on oxygen supplementation at baseline.  Assessment/Plan: Principal Problem:   Acute hypoxemic respiratory failure due to COVID-19 [facility]) Active Problems:   Hyperlipidemia associated with type 2 diabetes mellitus ([place])   Hypertension associated with diabetes ([place])   Diabetes mellitus, type 2 (HCC)   Hypokalemia   Acute hypoxic respiratory failure secondary to COVID-19 viral pneumonia -Oxygen levels-currently requiring 4 L nasal cannula to maintain O2 saturation greater than 90%. -Independently reviewed chest x-ray which shows bilateral pulmonary infiltrates worse at bases. -Continue remdesivir-D4/5.  Received Actemra on 05/27/2019.   -Continue IV Decadrone 6 mg daily -D4/10 -Increase Combivent to 2 puffs 3 times daily. -Continue vitamin C, D3 and zinc Continue incentive spirometer and flutter valve. -Negative procalcitonin. Elevated BNP greater than 790 Add p.o. Lasix 20 mg daily, judicious diuresing due to soft blood pressures. -Maintain O2 saturation greater than 92%. Mobilize.  Diabetes mellitus type 2 -A1c 8.4.  Home medications on  hold -Continue insulin sliding scale Accu-Chek.  Linagliptin 5 mg orally daily  Essential hypertension Blood pressure is at goal -Norvasc 5 mg daily.  Lisinopril 40 mg daily  Hyperlipidemia -Continue Lipitor. -Continue Daily aspirin   DVT prophylaxis: Lovenox subcu daily Code Status: Full code Family Communication:  Will update his girlfriend.    Disposition Plan:  Patient has been home.  Anticipate discharge to home once close to his baseline respiratory status.  Barrier to discharge persistent hypoxia and dyspnea requiring at least 4 L nasal cannula to maintain O2 saturation greater than 90%.  Not on oxygen supplementation at baseline.   Consultants:   None  Procedures:   None  Antimicrobials:   Remdesivir    Objective: Vitals:   05/28/19 0431 05/28/19 2104 05/29/19 0557 05/29/19 0800  BP: 98/70 112/68 124/79 116/70  Pulse: 75 77 66 76  Resp: 18  18   Temp: 98.2 F (36.8 C) 98.2 F (36.8 C) 98.4 F (36.9 C) 98.2 F (36.8 C)  TempSrc: Oral Oral Oral Oral  SpO2: 91% 94% 92%   Weight:      Height:        Intake/Output Summary (Last 24 hours) at 05/29/2019 1235 Last data filed at 05/29/2019 0900 Gross per 24 hour  Intake 970 ml  Output --  Net 970 ml   Filed Weights   05/26/19 1940 05/27/19 0449  Weight: 97.5 kg 99 kg    Exam:  . General: 59 y.o. year-old male well developed well nourished in no acute distress.  Alert and oriented x3. . Cardiovascular: Regular rate and rhythm with no rubs or gallops.  No thyromegaly or JVD noted.   Marland Kitchen  Respiratory: Rales at bases noted bilaterally.  No wheezing noted.  Good inspiratory effort. . Abdomen: Soft nontender nondistended with normal bowel sounds x4 quadrants. . Musculoskeletal: Trace lower extremity edema. Marland Kitchen Psychiatry: Mood is appropriate for condition and setting   Data Reviewed: CBC: Recent Labs  Lab 05/26/19 2133 05/28/19 0349 05/29/19 0331  WBC 9.4 7.2 7.8  NEUTROABS 7.4 5.0 6.0  HGB  16.3 15.8 16.5  HCT 49.2 47.2 50.1  MCV 84.7 84.1 84.6  PLT 208 268 [id number]   Basic Metabolic Panel: Recent Labs  Lab 05/26/19 2211 05/27/19 0534 05/28/19 0349 05/29/19 0331  NA 140  --  142 143  K 3.0*  --  3.3* 3.5  CL 104  --  105 103  CO2 21*  --  27 25  GLUCOSE 147*  --  159* 162*  BUN 29*  --  35* 37*  CREATININE 1.13  --  1.13 1.13  CALCIUM 8.3*  --  8.4* 8.2*  MG  --  2.2 2.8* 2.3  PHOS  --   --  3.0 3.1   GFR: Estimated Creatinine Clearance: 83.2 mL/min (by C-G formula based on SCr of 1.13 mg/dL). Liver Function Tests: Recent Labs  Lab 05/26/19 2211 05/28/19 0349 05/29/19 0331  AST 57* 53* 59*  ALT 49* 40 41  ALKPHOS 43 38 43  BILITOT 0.5 0.5 1.0  PROT 6.4* 5.8* 6.1*  ALBUMIN 3.0* 2.5* 2.8*   No results for input(s): LIPASE, AMYLASE in the last 168 hours. No results for input(s): AMMONIA in the last 168 hours. Coagulation Profile: No results for input(s): INR, PROTIME in the last 168 hours. Cardiac Enzymes: No results for input(s): CKTOTAL, CKMB, CKMBINDEX, TROPONINI in the last 168 hours. BNP (last 3 results) No results for input(s): PROBNP in the last 8760 hours. HbA1C: Recent Labs    05/27/19 0534  HGBA1C 8.4*   CBG: Recent Labs  Lab 05/28/19 1141 05/28/19 1649 05/28/19 2101 05/29/19 0742 05/29/19 1146  GLUCAP 179* 182* 182* 167* 207*   Lipid Profile: Recent Labs    05/26/19 2211  TRIG 77   Thyroid Function Tests: No results for input(s): TSH, T4TOTAL, FREET4, T3FREE, THYROIDAB in the last 72 hours. Anemia Panel: Recent Labs    05/28/19 0349 05/29/19 0331  FERRITIN 577* 492*   Urine analysis:    Component Value Date/Time   COLORURINE YELLOW 08/18/2015 [place] 08/18/2015 1146   LABSPEC 1.015 08/18/2015 1146   PHURINE 6.5 08/18/2015 1146   GLUCOSEU 2+ (A) 08/18/2015 1146   HGBUR NEGATIVE 08/18/2015 1146   BILIRUBINUR NEGATIVE 08/18/2015 1146   KETONESUR TRACE (A) 08/18/2015 1146   PROTEINUR 1+ (A)  08/18/2015 1146   NITRITE NEGATIVE 08/18/2015 1146   LEUKOCYTESUR NEGATIVE 08/18/2015 1146   Sepsis Labs: @LABRCNTIP (procalcitonin:4,lacticidven:4)  ) Recent Results (from the past 240 hour(s))  Blood Culture (routine x 2)     Status: None (Preliminary result)   Collection Time: 05/26/19 10:53 PM   Specimen: BLOOD  Result Value Ref Range Status   Specimen Description BLOOD LEFT ANTECUBITAL  Final   Special Requests   Final    BOTTLES DRAWN AEROBIC ONLY Blood Culture results may not be optimal due to an inadequate volume of blood received in culture bottles   Culture   Final    NO GROWTH 3 DAYS Performed at [facility], [place] [address], [place], [place] [address]    Report Status PENDING  Incomplete  Blood Culture (routine x 2)  Status: None (Preliminary result)   Collection Time: 05/26/19 10:55 PM   Specimen: BLOOD  Result Value Ref Range Status   Specimen Description BLOOD RIGHT ANTECUBITAL  Final   Special Requests   Final    BOTTLES DRAWN AEROBIC AND ANAEROBIC Blood Culture adequate volume   Culture   Final    NO GROWTH 3 DAYS Performed at [facility], [address], [place], [place] [address]    Report Status PENDING  Incomplete      Studies: No results found.  Scheduled Meds: . amLODipine  5 mg Oral Daily  . vitamin C  500 mg Oral Daily  . aspirin EC  81 mg Oral Daily  . atorvastatin  40 mg Oral QHS  . dexamethasone (DECADRON) injection  6 mg Intravenous Q0600  . enoxaparin (LOVENOX) injection  40 mg Subcutaneous QHS  . fluticasone  2 spray Each Nare Daily  . insulin aspart  0-9 Units Subcutaneous TID WC  . Ipratropium-Albuterol  1 puff Inhalation TID  . linagliptin  5 mg Oral Daily  . lisinopril  40 mg Oral Daily  . zinc sulfate  220 mg Oral Daily    Continuous Infusions: . remdesivir 100 mg in NS 100 mL 100 mg (05/29/19 0813)     LOS: 3 days     Kayleen Memos, MD [facility] Pager [phone]  If 7PM-7AM, please contact  night-coverage [email or web address] Password [facility] 05/29/2019, 12:35 PM

## 2019-05-29 NOTE — Plan of Care (Signed)
  Problem: Coping: Goal: Psychosocial and spiritual needs will be supported Outcome: Progressing   Problem: Respiratory: Goal: Will maintain a patent airway Outcome: Progressing Goal: Complications related to the disease process, condition or treatment will be avoided or minimized Outcome: Progressing   Problem: Education: Goal: Knowledge of General Education information will improve Description: Including pain rating scale, medication(s)/side effects and non-pharmacologic comfort measures Outcome: Progressing   Problem: Health Behavior/Discharge Planning: Goal: Ability to manage health-related needs will improve Outcome: Progressing   Problem: Clinical Measurements: Goal: Respiratory complications will improve Outcome: Progressing Goal: Cardiovascular complication will be avoided Outcome: Progressing   Problem: Activity: Goal: Risk for activity intolerance will decrease Outcome: Progressing

## 2019-05-30 LAB — CBC WITH DIFFERENTIAL/PLATELET
Abs Immature Granulocytes: 0.05 10*3/uL (ref 0.00–0.07)
Basophils Absolute: 0 10*3/uL (ref 0.0–0.1)
Basophils Relative: 0 %
Eosinophils Absolute: 0 10*3/uL (ref 0.0–0.5)
Eosinophils Relative: 0 %
HCT: 52.4 % — ABNORMAL HIGH (ref 39.0–52.0)
Hemoglobin: 17.3 g/dL — ABNORMAL HIGH (ref 13.0–17.0)
Immature Granulocytes: 1 %
Lymphocytes Relative: 17 %
Lymphs Abs: 1.3 10*3/uL (ref 0.7–4.0)
MCH: 28.3 pg (ref 26.0–34.0)
MCHC: 33 g/dL (ref 30.0–36.0)
MCV: 85.6 fL (ref 80.0–100.0)
Monocytes Absolute: 0.4 10*3/uL (ref 0.1–1.0)
Monocytes Relative: 5 %
Neutro Abs: 5.9 10*3/uL (ref 1.7–7.7)
Neutrophils Relative %: 77 %
Platelets: 411 10*3/uL — ABNORMAL HIGH (ref 150–400)
RBC: 6.12 MIL/uL — ABNORMAL HIGH (ref 4.22–5.81)
RDW: 15.4 % (ref 11.5–15.5)
WBC: 7.7 10*3/uL (ref 4.0–10.5)
nRBC: 0 % (ref 0.0–0.2)

## 2019-05-30 LAB — COMPREHENSIVE METABOLIC PANEL
ALT: 54 U/L — ABNORMAL HIGH (ref 0–44)
AST: 78 U/L — ABNORMAL HIGH (ref 15–41)
Albumin: 2.9 g/dL — ABNORMAL LOW (ref 3.5–5.0)
Alkaline Phosphatase: 49 U/L (ref 38–126)
Anion gap: 12 (ref 5–15)
BUN: 31 mg/dL — ABNORMAL HIGH (ref 6–20)
CO2: 22 mmol/L (ref 22–32)
Calcium: 8.4 mg/dL — ABNORMAL LOW (ref 8.9–10.3)
Chloride: 106 mmol/L (ref 98–111)
Creatinine, Ser: 0.96 mg/dL (ref 0.61–1.24)
GFR calc Af Amer: >60 mL/min (ref >60)
GFR calc non Af Amer: >60 mL/min (ref >60)
Glucose, Bld: 203 mg/dL — ABNORMAL HIGH (ref 70–99)
Potassium: 4 mmol/L (ref 3.5–5.1)
Sodium: 140 mmol/L (ref 135–145)
Total Bilirubin: 1.3 mg/dL — ABNORMAL HIGH (ref 0.3–1.2)
Total Protein: 6.1 g/dL — ABNORMAL LOW (ref 6.5–8.1)

## 2019-05-30 LAB — PHOSPHORUS: Phosphorus: 2.1 mg/dL — ABNORMAL LOW (ref 2.5–4.6)

## 2019-05-30 LAB — D-DIMER, QUANTITATIVE: D-Dimer, Quant: 0.78 ug/mL-FEU — ABNORMAL HIGH (ref 0.00–0.50)

## 2019-05-30 LAB — FERRITIN: Ferritin: 434 ng/mL — ABNORMAL HIGH (ref 24–336)

## 2019-05-30 LAB — C-REACTIVE PROTEIN: CRP: 1.2 mg/dL — ABNORMAL HIGH (ref <1.0)

## 2019-05-30 LAB — GLUCOSE, CAPILLARY
Glucose-Capillary: 189 mg/dL — ABNORMAL HIGH (ref 70–99)
Glucose-Capillary: 279 mg/dL — ABNORMAL HIGH (ref 70–99)
Glucose-Capillary: 223 mg/dL — ABNORMAL HIGH (ref 70–99)
Glucose-Capillary: 192 mg/dL — ABNORMAL HIGH (ref 70–99)

## 2019-05-30 LAB — MAGNESIUM: Magnesium: 2.4 mg/dL (ref 1.7–2.4)

## 2019-05-30 MED ORDER — LISINOPRIL 40 MG PO TABS: 40.0000 mg | ORAL_TABLET | Freq: Every day | ORAL | Status: DC

## 2019-05-30 MED ORDER — FUROSEMIDE 10 MG/ML IJ SOLN
20.0000 mg | Freq: Two times a day (BID) | INTRAMUSCULAR | Status: AC
  Administered 2019-05-30 – 2019-05-31 (×4): 20 mg via INTRAVENOUS
  Filled 2019-05-30 (×4): qty 2

## 2019-05-30 MED ORDER — HYDROCOD POLST-CPM POLST ER 10-8 MG/5ML PO SUER
5.0000 mL | Freq: Two times a day (BID) | ORAL | Status: AC
  Administered 2019-05-30 – 2019-06-01 (×6): 5 mL via ORAL
  Filled 2019-05-30 (×6): qty 5

## 2019-05-30 MED ORDER — FUROSEMIDE 10 MG/ML IJ SOLN: 20.0000 mg | Freq: Two times a day (BID) | INTRAMUSCULAR | Status: DC

## 2019-05-30 MED ORDER — AMLODIPINE BESYLATE 2.5 MG PO TABS
2.5000 mg | ORAL_TABLET | Freq: Every day | ORAL | Status: DC
  Administered 2019-06-01 – 2019-06-02 (×2): 2.5 mg via ORAL
  Filled 2019-05-30 (×2): qty 1

## 2019-05-30 NOTE — Evaluation (Signed)
Physical Therapy Evaluation Patient Details Name: Joseph [name] MRN: [id number] DOB: 12-May-1960 Today's Date: 05/30/2019   History of Present Illness  59 year old with history of DM2, HTN, HLD comes to the hospital with complaints of cough, dyspnea and pneumonia.  Clinical Impression  Patient presents with mobility close to functional baseline.  Able to mobilize in hallway on 3L O2 with SpO2 93% min to moderate dyspnea and some light headedness, though no LOB and balance WFL with screening.  Feel he will benefit from nursing to assist with hallway ambulation while inpatient.  No further skilled PT needs at this time.  Patient verbalized understanding of fall prevention information and energy conservation.  No follow up needs as well.     Follow Up Recommendations No PT follow up    Equipment Recommendations  None recommended by PT    Recommendations for Other Services       Precautions / Restrictions Precautions Precautions: None      Mobility  Bed Mobility               General bed mobility comments: up in recliner  Transfers   Equipment used: None                Ambulation/Gait Ambulation/Gait assistance: Independent Gait Distance (Feet): 200 Feet Assistive device: None Gait Pattern/deviations: Decreased stride length;WFL(Within Functional Limits)     General Gait Details: slightly shortened step length, reports mild light headedness, noted SpO2 on 3L O2 93% with ambulation  Stairs            Wheelchair Mobility    Modified Rankin (Stroke Patients Only)       Balance Overall balance assessment: Needs assistance   Sitting balance-Leahy Scale: Normal       Standing balance-Leahy Scale: Good                 High Level Balance Comments: eyes closed 10 sec, turning in circle, looking over shoulder, feet together and picking up item from floor all no LOB and performed without UE support             Pertinent Vitals/Pain Pain  Assessment: No/denies pain    Home Living Family/patient expects to be discharged to:: Private residence Living Arrangements: Alone;Children Available Help at Discharge: Family;Available PRN/intermittently Type of Home: House Home Access: Stairs to enter Entrance Stairs-Rails: Right Entrance Stairs-Number of Steps: 4 Home Layout: Two level;Able to live on main level with bedroom/bathroom Home Equipment: None      Prior Function Level of Independence: Independent         Comments: worked for [employer], but was retired due to [employer]; was looking for a job prior to getting ill; has his kids every other week (17, 14 and college senior)     Journalist, newspaper        Extremity/Trunk Assessment   Upper Extremity Assessment Upper Extremity Assessment: Overall WFL for tasks assessed    Lower Extremity Assessment Lower Extremity Assessment: Overall WFL for tasks assessed       Communication   Communication: No difficulties  Cognition Arousal/Alertness: Awake/alert Behavior During Therapy: WFL for tasks assessed/performed Overall Cognitive Status: Within Functional Limits for tasks assessed                                        General Comments General comments (skin integrity, edema, etc.): Reviewed fall prevention and energy conservation  Exercises     Assessment/Plan    PT Assessment Patent does not need any further PT services  PT Problem List         PT Treatment Interventions      PT Goals (Current goals can be found in the Care Plan section)  Acute Rehab PT Goals PT Goal Formulation: All assessment and education complete, DC therapy    Frequency     Barriers to discharge        Co-evaluation               AM-PAC PT "6 Clicks" Mobility  Outcome Measure Help needed turning from your back to your side while in a flat bed without using bedrails?: None Help needed moving from lying on your back to sitting on the side of a flat bed  without using bedrails?: None Help needed moving to and from a bed to a chair (including a wheelchair)?: None Help needed standing up from a chair using your arms (e.g., wheelchair or bedside chair)?: None Help needed to walk in hospital room?: None Help needed climbing 3-5 steps with a railing? : None 6 Click Score: 24    End of Session Equipment Utilized During Treatment: Oxygen Activity Tolerance: Patient tolerated treatment well Patient left: in chair;with call bell/phone within reach   PT Visit Diagnosis: Muscle weakness (generalized) (M62.81)    Time: 1051-1110 PT Time Calculation (min) (ACUTE ONLY): 19 min   Charges:   PT Evaluation $PT Eval Low Complexity: [place], PT Acute Rehabilitation Services [phone] 05/30/2019   Reginia Naas 05/30/2019, 2:04 PM

## 2019-05-30 NOTE — Plan of Care (Signed)
  Problem: Education: Goal: Knowledge of risk factors and measures for prevention of condition will improve Outcome: Progressing   Problem: Coping: Goal: Psychosocial and spiritual needs will be supported Outcome: Progressing   Problem: Respiratory: Goal: Will maintain a patent airway Outcome: Progressing Goal: Complications related to the disease process, condition or treatment will be avoided or minimized Outcome: Progressing   Problem: Education: Goal: Knowledge of General Education information will improve Description: Including pain rating scale, medication(s)/side effects and non-pharmacologic comfort measures Outcome: Progressing   Problem: Health Behavior/Discharge Planning: Goal: Ability to manage health-related needs will improve Outcome: Progressing   Problem: Clinical Measurements: Goal: Ability to maintain clinical measurements within normal limits will improve Outcome: Progressing Goal: Will remain free from infection Outcome: Progressing Goal: Diagnostic test results will improve Outcome: Progressing Goal: Respiratory complications will improve Outcome: Progressing Goal: Cardiovascular complication will be avoided Outcome: Progressing   Problem: Activity: Goal: Risk for activity intolerance will decrease Outcome: Progressing   Problem: Nutrition: Goal: Adequate nutrition will be maintained Outcome: Progressing   Problem: Coping: Goal: Level of anxiety will decrease Outcome: Progressing   Problem: Elimination: Goal: Will not experience complications related to bowel motility Outcome: Progressing Goal: Will not experience complications related to urinary retention Outcome: Progressing   Problem: Pain Managment: Goal: General experience of comfort will improve Outcome: Progressing   Problem: Safety: Goal: Ability to remain free from injury will improve Outcome: Progressing   Problem: Skin Integrity: Goal: Risk for impaired skin integrity will  decrease Outcome: Progressing   Problem: Respiratory: Goal: Ability to maintain adequate ventilation will improve Outcome: Progressing

## 2019-05-30 NOTE — Progress Notes (Signed)
Updated the patient's significant other Abigail Butts via phone at patient's own request.  [phone].  All questions answered.     Will continue to provide updates as possible.

## 2019-05-30 NOTE — Progress Notes (Signed)
PROGRESS NOTE  Joseph [name] [id number] DOB: 07/29/1960 DOA: 05/26/2019 PCP: Susy Frizzle, MD  HPI/Recap of past 17 hours: 59 year old with history of DM2, HTN, HLD comes to the hospital with complaints of cough, dyspnea and pneumonia.  Went to urgent care with these symptoms on 1/17, COVID-19 was negative and he was placed on doxycycline.  Then again started having diarrhea and went back to urgent care on 1/20 and the testing was positive for COVID-19 that day.  Worsening of the symptoms on 1/25. returned back to urgent care on 1/25, chest x-ray showed infiltrates therefore sent to ER.  In the hospital started on remdesivir and Decadron, given a dose of Actemra on 1/26.  05/30/19: Seen and examined.  States he feels better.  Still hypoxic and requiring 4 L to maintain sats greater than 92%.  Not on oxygen supplementation at baseline.   Assessment/Plan: Principal Problem:   Acute hypoxemic respiratory failure due to COVID-19 [facility]) Active Problems:   Hyperlipidemia associated with type 2 diabetes mellitus ([place])   Hypertension associated with diabetes ([place])   Diabetes mellitus, type 2 (H[facility])   Hypokalemia   Acute hypoxic respiratory failure secondary to COVID-19 viral pneumonia -Oxygen levels-currently requiring 4 L nasal cannula to maintain O2 saturation greater than 90%. CXR Bilateral pulmonary infiltrates worse at bases. -Continue remdesivir-D4/5.  Received Actemra on 05/27/2019.   -Continue IV Decadrone 6 mg daily -D4/10 -Increase Combivent to 2 puffs 3 times daily. -Continue vitamin C, D3 and zinc Continue incentive spirometer and flutter valve. -Negative procalcitonin. Elevated BNP greater than 790  p.o. Lasix 20 mg twice daily, judicious diuresing due to soft blood pressures. -Maintain O2 saturation greater than 92%. Continue to mobilize.  Diabetes mellitus type 2 -A1c 8.4.  Home medications on hold -Continue insulin sliding scale Accu-Chek.   Continue linagliptin  5 mg orally daily  Essential hypertension Reduce dose of Norvasc to 2.5 mg daily. Hold off lisinopril to provide diuresing without causing hypotension Resume Acei after diuresing is stopped.  Hyperlipidemia -Continue Lipitor. -Continue Daily aspirin   DVT prophylaxis: Lovenox subcu daily Code Status: Full code Family Communication:  Will update his girlfriend.    Disposition Plan:  Patient is from home.  Anticipate discharge to home once close to his baseline O2.  Barrier to discharge persistent hypoxia and dyspnea requiring at least 4 L nasal cannula to maintain O2 saturation greater than 90%.  Not on oxygen supplementation at baseline.   Consultants:   None  Procedures:   None  Antimicrobials:   Remdesivir    Objective: Vitals:   05/29/19 2039 05/29/19 2115 05/30/19 0458 05/30/19 0930  BP: 120/73  119/75 (!) 121/91  Pulse: 75  65   Resp: 18  18   Temp: 98.8 F (37.1 C)  98.3 F (36.8 C)   TempSrc: Oral  Oral   SpO2: 91% 94% 92%   Weight:      Height:        Intake/Output Summary (Last 24 hours) at 05/30/2019 1440 Last data filed at 05/30/2019 0900 Gross per 24 hour  Intake 230 ml  Output --  Net 230 ml   Filed Weights   05/26/19 1940 05/27/19 0449  Weight: 97.5 kg 99 kg    Exam:  . General: 59 y.o. year-old male pleasant well-developed well-nourished no acute distress.  Alert and oriented x3.   . Cardiovascular: Regular rate and rhythm no rubs or gallops.   Marland Kitchen Respiratory: Rales at bases noted bilaterally no wheezing noted.  Good respiratory effort.   . Abdomen: Soft nontender monitor normal bowel sounds present.  . Musculoskeletal: Trace lower extremity edema bilaterally.   Psychiatry: Mood is appropriate for condition and setting.  Data Reviewed: CBC: Recent Labs  Lab 05/26/19 2133 05/28/19 0349 05/29/19 0331 05/30/19 0848  WBC 9.4 7.2 7.8 7.7  NEUTROABS 7.4 5.0 6.0 5.9  HGB 16.3 15.8 16.5 17.3*  HCT 49.2 47.2 50.1 52.4*  MCV  84.7 84.1 84.6 85.6  PLT 208 268 323 [id number]*   Basic Metabolic Panel: Recent Labs  Lab 05/26/19 2211 05/27/19 0534 05/28/19 0349 05/29/19 0331 05/30/19 0848  NA 140  --  142 143 140  K 3.0*  --  3.3* 3.5 4.0  CL 104  --  105 103 106  CO2 21*  --  27 25 22   GLUCOSE 147*  --  159* 162* 203*  BUN 29*  --  35* 37* 31*  CREATININE 1.13  --  1.13 1.13 0.96  CALCIUM 8.3*  --  8.4* 8.2* 8.4*  MG  --  2.2 2.8* 2.3 2.4  PHOS  --   --  3.0 3.1 2.1*   GFR: Estimated Creatinine Clearance: 98 mL/min (by C-G formula based on SCr of 0.96 mg/dL). Liver Function Tests: Recent Labs  Lab 05/26/19 2211 05/28/19 0349 05/29/19 0331 05/30/19 0848  AST 57* 53* 59* 78*  ALT 49* 40 41 54*  ALKPHOS 43 38 43 49  BILITOT 0.5 0.5 1.0 1.3*  PROT 6.4* 5.8* 6.1* 6.1*  ALBUMIN 3.0* 2.5* 2.8* 2.9*   No results for input(s): LIPASE, AMYLASE in the last 168 hours. No results for input(s): AMMONIA in the last 168 hours. Coagulation Profile: No results for input(s): INR, PROTIME in the last 168 hours. Cardiac Enzymes: No results for input(s): CKTOTAL, CKMB, CKMBINDEX, TROPONINI in the last 168 hours. BNP (last 3 results) No results for input(s): PROBNP in the last 8760 hours. HbA1C: No results for input(s): HGBA1C in the last 72 hours. CBG: Recent Labs  Lab 05/29/19 1146 05/29/19 1651 05/29/19 2114 05/30/19 0801 05/30/19 1111  GLUCAP 207* 214* 190* 189* 279*   Lipid Profile: No results for input(s): CHOL, HDL, LDLCALC, TRIG, CHOLHDL, LDLDIRECT in the last 72 hours. Thyroid Function Tests: No results for input(s): TSH, T4TOTAL, FREET4, T3FREE, THYROIDAB in the last 72 hours. Anemia Panel: Recent Labs    05/29/19 0331 05/30/19 0848  FERRITIN 492* 434*   Urine analysis:    Component Value Date/Time   COLORURINE YELLOW 08/18/2015 [place] 08/18/2015 1146   LABSPEC 1.015 08/18/2015 1146   PHURINE 6.5 08/18/2015 1146   GLUCOSEU 2+ (A) 08/18/2015 1146   HGBUR NEGATIVE  08/18/2015 1146   BILIRUBINUR NEGATIVE 08/18/2015 1146   KETONESUR TRACE (A) 08/18/2015 1146   PROTEINUR 1+ (A) 08/18/2015 1146   NITRITE NEGATIVE 08/18/2015 1146   LEUKOCYTESUR NEGATIVE 08/18/2015 1146   Sepsis Labs: @LABRCNTIP (procalcitonin:4,lacticidven:4)  ) Recent Results (from the past 240 hour(s))  Blood Culture (routine x 2)     Status: None (Preliminary result)   Collection Time: 05/26/19 10:53 PM   Specimen: BLOOD  Result Value Ref Range Status   Specimen Description BLOOD LEFT ANTECUBITAL  Final   Special Requests   Final    BOTTLES DRAWN AEROBIC ONLY Blood Culture results may not be optimal due to an inadequate volume of blood received in culture bottles   Culture   Final    NO GROWTH 4 DAYS Performed at [facility], [place]  [address] [place], [place] [address]    Report Status PENDING  Incomplete  Blood Culture (routine x 2)     Status: None (Preliminary result)   Collection Time: 05/26/19 10:55 PM   Specimen: BLOOD  Result Value Ref Range Status   Specimen Description BLOOD RIGHT ANTECUBITAL  Final   Special Requests   Final    BOTTLES DRAWN AEROBIC AND ANAEROBIC Blood Culture adequate volume   Culture   Final    NO GROWTH 4 DAYS Performed at [facility], [place] [address], [place], [place] [address]    Report Status PENDING  Incomplete      Studies: No results found.  Scheduled Meds: . [START ON 06/01/2019] amLODipine  2.5 mg Oral Daily  . vitamin C  500 mg Oral Daily  . aspirin EC  81 mg Oral Daily  . atorvastatin  40 mg Oral QHS  . chlorpheniramine-HYDROcodone  5 mL Oral Q12H  . cholecalciferol  1,000 Units Oral Daily  . dexamethasone (DECADRON) injection  6 mg Intravenous Q0600  . enoxaparin (LOVENOX) injection  40 mg Subcutaneous QHS  . fluticasone  2 spray Each Nare Daily  . furosemide  20 mg Intravenous BID  . insulin aspart  0-9 Units Subcutaneous TID WC  . Ipratropium-Albuterol  2 puff Inhalation TID  . linagliptin  5 mg Oral Daily   . lisinopril  40 mg Oral Daily  . zinc sulfate  220 mg Oral Daily    Continuous Infusions: . remdesivir 100 mg in NS 100 mL 100 mg (05/30/19 0935)     LOS: 4 days     Kayleen Memos, MD [facility] Pager [phone]  If 7PM-7AM, please contact night-coverage [email or web address] Password [email or web address] 05/30/2019, 2:40 PM

## 2019-05-31 DIAGNOSIS — E876 Hypokalemia: Secondary | ICD-10-CM

## 2019-05-31 DIAGNOSIS — U071 COVID-19: Principal | ICD-10-CM

## 2019-05-31 DIAGNOSIS — J9601 Acute respiratory failure with hypoxia: Secondary | ICD-10-CM

## 2019-05-31 DIAGNOSIS — E1165 Type 2 diabetes mellitus with hyperglycemia: Secondary | ICD-10-CM

## 2019-05-31 LAB — COMPREHENSIVE METABOLIC PANEL
ALT: 58 U/L — ABNORMAL HIGH (ref 0–44)
AST: 61 U/L — ABNORMAL HIGH (ref 15–41)
Albumin: 2.8 g/dL — ABNORMAL LOW (ref 3.5–5.0)
Alkaline Phosphatase: 44 U/L (ref 38–126)
Anion gap: 9 (ref 5–15)
BUN: 33 mg/dL — ABNORMAL HIGH (ref 6–20)
CO2: 26 mmol/L (ref 22–32)
Calcium: 8.2 mg/dL — ABNORMAL LOW (ref 8.9–10.3)
Chloride: 106 mmol/L (ref 98–111)
Creatinine, Ser: 1.02 mg/dL (ref 0.61–1.24)
GFR calc Af Amer: >60 mL/min (ref >60)
GFR calc non Af Amer: >60 mL/min (ref >60)
Glucose, Bld: 161 mg/dL — ABNORMAL HIGH (ref 70–99)
Potassium: 3.1 mmol/L — ABNORMAL LOW (ref 3.5–5.1)
Sodium: 141 mmol/L (ref 135–145)
Total Bilirubin: 0.8 mg/dL (ref 0.3–1.2)
Total Protein: 5.8 g/dL — ABNORMAL LOW (ref 6.5–8.1)

## 2019-05-31 LAB — CBC WITH DIFFERENTIAL/PLATELET
Abs Immature Granulocytes: 0.05 10*3/uL (ref 0.00–0.07)
Basophils Absolute: 0 10*3/uL (ref 0.0–0.1)
Basophils Relative: 0 %
Eosinophils Absolute: 0 10*3/uL (ref 0.0–0.5)
Eosinophils Relative: 1 %
HCT: 48.8 % (ref 39.0–52.0)
Hemoglobin: 16.3 g/dL (ref 13.0–17.0)
Immature Granulocytes: 1 %
Lymphocytes Relative: 28 %
Lymphs Abs: 2.4 10*3/uL (ref 0.7–4.0)
MCH: 28 pg (ref 26.0–34.0)
MCHC: 33.4 g/dL (ref 30.0–36.0)
MCV: 83.7 fL (ref 80.0–100.0)
Monocytes Absolute: 0.8 10*3/uL (ref 0.1–1.0)
Monocytes Relative: 9 %
Neutro Abs: 5.4 10*3/uL (ref 1.7–7.7)
Neutrophils Relative %: 61 %
Platelets: 359 10*3/uL (ref 150–400)
RBC: 5.83 MIL/uL — ABNORMAL HIGH (ref 4.22–5.81)
RDW: 15 % (ref 11.5–15.5)
WBC: 8.7 10*3/uL (ref 4.0–10.5)
nRBC: 0 % (ref 0.0–0.2)

## 2019-05-31 LAB — CULTURE, BLOOD (ROUTINE X 2)
Culture: NO GROWTH
Culture: NO GROWTH
Special Requests: ADEQUATE

## 2019-05-31 LAB — MAGNESIUM: Magnesium: 2.2 mg/dL (ref 1.7–2.4)

## 2019-05-31 LAB — GLUCOSE, CAPILLARY
Glucose-Capillary: 174 mg/dL — ABNORMAL HIGH (ref 70–99)
Glucose-Capillary: 267 mg/dL — ABNORMAL HIGH (ref 70–99)
Glucose-Capillary: 227 mg/dL — ABNORMAL HIGH (ref 70–99)
Glucose-Capillary: 207 mg/dL — ABNORMAL HIGH (ref 70–99)

## 2019-05-31 LAB — D-DIMER, QUANTITATIVE: D-Dimer, Quant: 0.71 ug/mL-FEU — ABNORMAL HIGH (ref 0.00–0.50)

## 2019-05-31 LAB — PHOSPHORUS: Phosphorus: 3.2 mg/dL (ref 2.5–4.6)

## 2019-05-31 LAB — FERRITIN: Ferritin: 367 ng/mL — ABNORMAL HIGH (ref 24–336)

## 2019-05-31 LAB — C-REACTIVE PROTEIN: CRP: 0.7 mg/dL (ref <1.0)

## 2019-05-31 MED ORDER — POTASSIUM CHLORIDE CRYS ER 20 MEQ PO TBCR
20.0000 meq | EXTENDED_RELEASE_TABLET | ORAL | Status: AC
  Administered 2019-05-31 (×2): 20 meq via ORAL
  Filled 2019-05-31 (×2): qty 1

## 2019-05-31 NOTE — Progress Notes (Addendum)
PROGRESS NOTE  Joseph [name] [id number] DOB: Sep 29, 1960 DOA: 05/26/2019 PCP: Susy Frizzle, MD  HPI/Recap of past 5 hours: 59 year old with history of DM2, HTN, HLD comes to the hospital with complaints of cough, dyspnea and pneumonia.  Went to urgent care with these symptoms on 1/17, COVID-19 was negative and he was placed on doxycycline.  Then again started having diarrhea and went back to urgent care on 1/20 and the testing was positive for COVID-19 that day.  Worsening of the symptoms on 1/25. returned back to urgent care on 1/25, chest x-ray showed infiltrates therefore sent to ER.  In the hospital started on remdesivir and Decadron, given a dose of Actemra on 1/26.  Patient weaned to room air on the afternoon of 1/30.    Assessment/Plan: Principal Problem:   Acute hypoxemic respiratory failure due to COVID-19 [facility]) Active Problems:   Hyperlipidemia associated with type 2 diabetes mellitus ([place])   Hypertension associated with diabetes ([place])   Diabetes mellitus, type 2 (HCC)   Hypokalemia   Acute hypoxic respiratory failure secondary to COVID-19 viral pneumonia -Wean oxygen, still dyspneic with movement but no longer requiring oxygen as of this afternoon. CXR Bilateral pulmonary infiltrates worse at bases. -Continue remdesivir-D5/5.  Received Actemra on 05/27/2019.   -Continue IV Decadrone 6 mg daily -D5/10 -Increase Combivent to 2 puffs 3 times daily. -Continue vitamin C, D3 and zinc Continue incentive spirometer and flutter valve. -Negative procalcitonin. -Last dose of Lasix this evening, would restart home antihypertensives after he completes this course -Can likely discharge home on 1/31 depending how he feels -Maintain O2 saturation greater than 92%. Continue to mobilize.  Diabetes mellitus type 2 -A1c 8.4.  Home medications on hold -Continue insulin sliding scale Accu-Chek.   Continue linagliptin 5 mg orally daily  Essential hypertension Reduce dose of  Norvasc to 2.5 mg daily. -Holding home ACE and HCTZ at this time   Hypokalemia, potassium repleted  Hyperlipidemia -Continue Lipitor. -Continue Daily aspirin   DVT prophylaxis: Lovenox subcu daily Code Status: Full code Family Communication:  Updated wife at bedside today (phone)   Disposition Plan:  Patient is from home.  Anticipate discharge to home once close to his baseline O2.  Barrier to discharge persistent hypoxia and dyspnea requiring at least 3L    Not on oxygen supplementation at baseline.Weaned this PM---likely DC on 1/31   Consultants:   None  Procedures:   None  Antimicrobials:   Remdesivir- ended 1/30   Ejective: Patient reports he feels overall okay.  He reports dyspnea when he walks.  Objective: Vitals:   05/31/19 0700 05/31/19 0900 05/31/19 1200 05/31/19 1508  BP:    115/81  Pulse:   71 74  Resp:  16 18 20   Temp:    98 F (36.7 C)  TempSrc:    Oral  SpO2: 97% 96% 97% 92%  Weight:      Height:        Intake/Output Summary (Last 24 hours) at 05/31/2019 1644 Last data filed at 05/30/2019 1835 Gross per 24 hour  Intake 120 ml  Output --  Net 120 ml   Filed Weights   05/26/19 1940 05/27/19 0449  Weight: 97.5 kg 99 kg    Exam:  . General: 59 y.o. year-old male is unable to speak in full sentences distress.  Alert and oriented x3.   . Cardiovascular: Regular rate and rhythm no rubs or gallops.   Marland Kitchen Respiratory: Rales at bases noted bilaterally no wheezing noted.  Good  respiratory effort.  Tachypnea . Abdomen: Soft nontender monitor normal bowel sounds present.  . Musculoskeletal: Trace lower extremity edema bilaterally.   Psychiatry: Mood is appropriate for condition and setting.  Data Reviewed: CBC: Recent Labs  Lab 05/26/19 2133 05/28/19 0349 05/29/19 0331 05/30/19 0848 05/31/19 0443  WBC 9.4 7.2 7.8 7.7 8.7  NEUTROABS 7.4 5.0 6.0 5.9 5.4  HGB 16.3 15.8 16.5 17.3* 16.3  HCT 49.2 47.2 50.1 52.4* 48.8  MCV 84.7 84.1 84.6  85.6 83.7  PLT 208 268 323 411* [id number]   Basic Metabolic Panel: Recent Labs  Lab 05/26/19 2211 05/27/19 0534 05/28/19 0349 05/29/19 0331 05/30/19 0848 05/31/19 0443  NA 140  --  142 143 140 141  K 3.0*  --  3.3* 3.5 4.0 3.1*  CL 104  --  105 103 106 106  CO2 21*  --  27 25 22 26   GLUCOSE 147*  --  159* 162* 203* 161*  BUN 29*  --  35* 37* 31* 33*  CREATININE 1.13  --  1.13 1.13 0.96 1.02  CALCIUM 8.3*  --  8.4* 8.2* 8.4* 8.2*  MG  --  2.2 2.8* 2.3 2.4 2.2  PHOS  --   --  3.0 3.1 2.1* 3.2   GFR: Estimated Creatinine Clearance: 92.2 mL/min (by C-G formula based on SCr of 1.02 mg/dL). Liver Function Tests: Recent Labs  Lab 05/26/19 2211 05/28/19 0349 05/29/19 0331 05/30/19 0848 05/31/19 0443  AST 57* 53* 59* 78* 61*  ALT 49* 40 41 54* 58*  ALKPHOS 43 38 43 49 44  BILITOT 0.5 0.5 1.0 1.3* 0.8  PROT 6.4* 5.8* 6.1* 6.1* 5.8*  ALBUMIN 3.0* 2.5* 2.8* 2.9* 2.8*   HbA1C: No results for input(s): HGBA1C in the last 72 hours. CBG: Recent Labs  Lab 05/30/19 1111 05/30/19 1659 05/30/19 2112 05/31/19 0748 05/31/19 1153  GLUCAP 279* 223* 192* 174* 267*   Lipid Profile: No results for input(s): CHOL, HDL, LDLCALC, TRIG, CHOLHDL, LDLDIRECT in the last 72 hours. Thyroid Function Tests: No results for input(s): TSH, T4TOTAL, FREET4, T3FREE, THYROIDAB in the last 72 hours. Anemia Panel: Recent Labs    05/30/19 0848 05/31/19 0443  FERRITIN 434* 367*      Studies: No results found.  Scheduled Meds: . [START ON 06/01/2019] amLODipine  2.5 mg Oral Daily  . vitamin C  500 mg Oral Daily  . aspirin EC  81 mg Oral Daily  . atorvastatin  40 mg Oral QHS  . chlorpheniramine-HYDROcodone  5 mL Oral Q12H  . cholecalciferol  1,000 Units Oral Daily  . dexamethasone (DECADRON) injection  6 mg Intravenous Q0600  . enoxaparin (LOVENOX) injection  40 mg Subcutaneous QHS  . fluticasone  2 spray Each Nare Daily  . furosemide  20 mg Intravenous BID  . insulin aspart  0-9 Units  Subcutaneous TID WC  . Ipratropium-Albuterol  2 puff Inhalation TID  . linagliptin  5 mg Oral Daily  . [START ON 06/01/2019] lisinopril  40 mg Oral Daily  . zinc sulfate  220 mg Oral Daily    Continuous Infusions: None    LOS: 5 days     Martyn Malay, MD [facility] Pager [phone]  If 7PM-7AM, please contact night-coverage [email or web address] Password [facility] 05/31/2019, 4:44 PM

## 2019-05-31 NOTE — Progress Notes (Signed)
SATURATION QUALIFICATIONS: (This note is used to comply with regulatory documentation for home oxygen)  Patient Saturations on Room Air at Rest = 97%  Patient Saturations on Room Air while Ambulating = 93%  Patient Saturations on 0 Liters of oxygen while Ambulating = 93 %  Please briefly explain why patient needs home oxygen: at this time I don't think this patient willneed oxygen at home.

## 2019-06-01 LAB — CBC WITH DIFFERENTIAL/PLATELET
Abs Immature Granulocytes: 0.08 10*3/uL — ABNORMAL HIGH (ref 0.00–0.07)
Basophils Absolute: 0 10*3/uL (ref 0.0–0.1)
Basophils Relative: 0 %
Eosinophils Absolute: 0.1 10*3/uL (ref 0.0–0.5)
Eosinophils Relative: 1 %
HCT: 47.5 % (ref 39.0–52.0)
Hemoglobin: 16.1 g/dL (ref 13.0–17.0)
Immature Granulocytes: 1 %
Lymphocytes Relative: 24 %
Lymphs Abs: 2.3 10*3/uL (ref 0.7–4.0)
MCH: 28.1 pg (ref 26.0–34.0)
MCHC: 33.9 g/dL (ref 30.0–36.0)
MCV: 83 fL (ref 80.0–100.0)
Monocytes Absolute: 0.9 10*3/uL (ref 0.1–1.0)
Monocytes Relative: 9 %
Neutro Abs: 6.3 10*3/uL (ref 1.7–7.7)
Neutrophils Relative %: 65 %
Platelets: 364 10*3/uL (ref 150–400)
RBC: 5.72 MIL/uL (ref 4.22–5.81)
RDW: 14.7 % (ref 11.5–15.5)
WBC: 9.7 10*3/uL (ref 4.0–10.5)
nRBC: 0 % (ref 0.0–0.2)

## 2019-06-01 LAB — PHOSPHORUS: Phosphorus: 3.4 mg/dL (ref 2.5–4.6)

## 2019-06-01 LAB — COMPREHENSIVE METABOLIC PANEL
ALT: 65 U/L — ABNORMAL HIGH (ref 0–44)
AST: 43 U/L — ABNORMAL HIGH (ref 15–41)
Albumin: 2.8 g/dL — ABNORMAL LOW (ref 3.5–5.0)
Alkaline Phosphatase: 41 U/L (ref 38–126)
Anion gap: 9 (ref 5–15)
BUN: 31 mg/dL — ABNORMAL HIGH (ref 6–20)
CO2: 27 mmol/L (ref 22–32)
Calcium: 8.4 mg/dL — ABNORMAL LOW (ref 8.9–10.3)
Chloride: 105 mmol/L (ref 98–111)
Creatinine, Ser: 0.98 mg/dL (ref 0.61–1.24)
GFR calc Af Amer: >60 mL/min (ref >60)
GFR calc non Af Amer: >60 mL/min (ref >60)
Glucose, Bld: 171 mg/dL — ABNORMAL HIGH (ref 70–99)
Potassium: 3.3 mmol/L — ABNORMAL LOW (ref 3.5–5.1)
Sodium: 141 mmol/L (ref 135–145)
Total Bilirubin: 0.8 mg/dL (ref 0.3–1.2)
Total Protein: 5.7 g/dL — ABNORMAL LOW (ref 6.5–8.1)

## 2019-06-01 LAB — MAGNESIUM: Magnesium: 2.1 mg/dL (ref 1.7–2.4)

## 2019-06-01 LAB — D-DIMER, QUANTITATIVE: D-Dimer, Quant: 0.43 ug/mL-FEU (ref 0.00–0.50)

## 2019-06-01 LAB — GLUCOSE, CAPILLARY
Glucose-Capillary: 200 mg/dL — ABNORMAL HIGH (ref 70–99)
Glucose-Capillary: 263 mg/dL — ABNORMAL HIGH (ref 70–99)
Glucose-Capillary: 235 mg/dL — ABNORMAL HIGH (ref 70–99)
Glucose-Capillary: 257 mg/dL — ABNORMAL HIGH (ref 70–99)

## 2019-06-01 LAB — FERRITIN: Ferritin: 361 ng/mL — ABNORMAL HIGH (ref 24–336)

## 2019-06-01 LAB — C-REACTIVE PROTEIN: CRP: 0.6 mg/dL (ref <1.0)

## 2019-06-01 NOTE — Progress Notes (Signed)
SATURATION QUALIFICATIONS: (This note is used to comply with regulatory documentation for home oxygen)  Patient Saturations on Room Air at Rest = 95%  Patient Saturations on Room Air while Ambulating = 92%  Patient Saturations on 0 Liters of oxygen while Ambulating = 92%  Please briefly explain why patient needs home oxygen: Pt not requiring home O2.

## 2019-06-01 NOTE — Plan of Care (Signed)
  Problem: Education: Goal: Knowledge of risk factors and measures for prevention of condition will improve Outcome: Progressing   Problem: Coping: Goal: Psychosocial and spiritual needs will be supported Outcome: Progressing   Problem: Respiratory: Goal: Will maintain a patent airway Outcome: Progressing Goal: Complications related to the disease process, condition or treatment will be avoided or minimized Outcome: Progressing   Problem: Education: Goal: Knowledge of General Education information will improve Description: Including pain rating scale, medication(s)/side effects and non-pharmacologic comfort measures Outcome: Progressing   Problem: Health Behavior/Discharge Planning: Goal: Ability to manage health-related needs will improve Outcome: Progressing   Problem: Clinical Measurements: Goal: Ability to maintain clinical measurements within normal limits will improve Outcome: Progressing Goal: Will remain free from infection Outcome: Progressing Goal: Diagnostic test results will improve Outcome: Progressing Goal: Respiratory complications will improve Outcome: Progressing Goal: Cardiovascular complication will be avoided Outcome: Progressing   Problem: Activity: Goal: Risk for activity intolerance will decrease Outcome: Progressing   Problem: Nutrition: Goal: Adequate nutrition will be maintained Outcome: Progressing   Problem: Coping: Goal: Level of anxiety will decrease Outcome: Progressing   Problem: Elimination: Goal: Will not experience complications related to bowel motility Outcome: Progressing Goal: Will not experience complications related to urinary retention Outcome: Progressing   Problem: Pain Managment: Goal: General experience of comfort will improve Outcome: Progressing   Problem: Safety: Goal: Ability to remain free from injury will improve Outcome: Progressing   Problem: Skin Integrity: Goal: Risk for impaired skin integrity will  decrease Outcome: Progressing   Problem: Respiratory: Goal: Ability to maintain adequate ventilation will improve Outcome: Progressing

## 2019-06-01 NOTE — Progress Notes (Signed)
PROGRESS NOTE    Joseph [name]  [id number] DOB: 02-Jul-1960 DOA: 05/26/2019 PCP: Susy Frizzle, MD    Brief Narrative:  59 year old with history of DM2, HTN, HLD comes to the hospital with complaints of cough, dyspnea and pneumonia. Went to urgent care with these symptoms on 1/17, COVID-19 was negative and he was placed on doxycycline. Then again started having diarrhea and went back to urgent care on 1/20 and the testing was positive for COVID-19 that day. Worsening of the symptoms on 1/25. returned back to urgent care on 1/25, chest x-ray showed infiltrates therefore sent to ER.In the hospital started on remdesivir and Decadron, given a dose of Actemra on 1/26.  Patient weaned to room air on the afternoon of 1/30.  Assessment & Plan:   Principal Problem:   Acute hypoxemic respiratory failure due to COVID-19 [facility]) Active Problems:   Hyperlipidemia associated with type 2 diabetes mellitus ([place])   Hypertension associated with diabetes ([place])   Diabetes mellitus, type 2 ([facility])   Hypokalemia  Acute hypoxic respiratory failure secondary to COVID-19 viral pneumonia -Wean oxygen, still dyspneic with movement but no longer requiring oxygen as of this afternoon. CXR Bilateral pulmonary infiltrates worse at bases. -Continue remdesivir-D5/5.  Received Actemra on 05/27/2019.   -Continue IV Decadrone 6 mg daily -D5/10 -Increase Combivent to 2 puffs 3 times daily. -Continue vitamin C, D3 and zinc Continue incentive spirometer and flutter valve. -Negative procalcitonin. -Last dose of Lasix this evening, would restart home antihypertensives after he completes this course -Can likely discharge home on 1/31 depending how he feels--wants to wait one more day--wants home O2. -Maintain O2 saturation greater than 92%. Continue to mobilize.  Diabetes mellitus type 2 -A1c 8.4. Home medications on hold -Continue insulin sliding scale Accu-Chek. Continue linagliptin 5 mg orally  daily  Essential hypertension Reduce dose of Norvasc to 2.5 mg daily. -Holding home ACE and HCTZ at this time   Hypokalemia, potassium repleted  Hyperlipidemia -Continue Lipitor. -Continue Daily aspirin   DVT prophylaxis: Lovenox SQ Code Status: Full code  Family Communication: Abigail Butts on the phone during visit Disposition Plan: Patient is from home.  Anticipate discharge to home once close to his baseline.  Barrier to discharge persistent hypoxia and dyspnea   Not on oxygen supplementation at baseline.Weaned this PM--will do waking test to check how is oxygen is doing. D/c anticipated 2/1.  Consultants:   None  Procedures:  None   Antimicrobials: Anti-infectives (From admission, onward)   Start     Dose/Rate Route Frequency Ordered Stop   05/28/19 1000  remdesivir 100 mg in sodium chloride 0.9 % 100 mL IVPB     100 mg 200 mL/hr over 30 Minutes Intravenous Daily 05/27/19 0015 05/31/19 1037   05/27/19 0100  remdesivir 200 mg in sodium chloride 0.9% 250 mL IVPB     200 mg 580 mL/hr over 30 Minutes Intravenous Once 05/27/19 0015 05/27/19 0154       Subjective: Feels ok. Still tired and still with SOB with exertion.  Objective: Vitals:   05/31/19 2230 06/01/19 0437 06/01/19 0541 06/01/19 1338  BP:   (!) 141/85 123/69  Pulse:   68 82  Resp:   20 16  Temp:   97.8 F (36.6 C) (!) 97.4 F (36.3 C)  TempSrc:   Oral Oral  SpO2: 92%  95% 94%  Weight:  96.7 kg    Height:        Intake/Output Summary (Last 24 hours) at 06/01/2019 1433 Last data filed  at 06/01/2019 0950 Gross per 24 hour  Intake 600 ml  Output --  Net 600 ml   Filed Weights   05/26/19 1940 05/27/19 0449 06/01/19 0437  Weight: 97.5 kg 99 kg 96.7 kg    Examination:  General exam: Appears calm and comfortable  Respiratory system: Few rales. Respiratory effort normal. Cardiovascular system: S1 & S2 heard, RRR.  Gastrointestinal system: Abdomen is nondistended, soft and nontender.  Central  nervous system: Alert and oriented. No focal neurological deficits. Extremities: Symmetric  Skin: No rashes Psychiatry: Judgement and insight appear normal. Mood & affect appropriate.   Data Reviewed: I have personally reviewed following labs and imaging studies  CBC: Recent Labs  Lab 05/28/19 0349 05/29/19 0331 05/30/19 0848 05/31/19 0443 06/01/19 0340  WBC 7.2 7.8 7.7 8.7 9.7  NEUTROABS 5.0 6.0 5.9 5.4 6.3  HGB 15.8 16.5 17.3* 16.3 16.1  HCT 47.2 50.1 52.4* 48.8 47.5  MCV 84.1 84.6 85.6 83.7 83.0  PLT 268 323 411* 359 [id number]   Basic Metabolic Panel: Recent Labs  Lab 05/28/19 0349 05/29/19 0331 05/30/19 0848 05/31/19 0443 06/01/19 0340  NA 142 143 140 141 141  K 3.3* 3.5 4.0 3.1* 3.3*  CL 105 103 106 106 105  CO2 27 25 22 26 27   GLUCOSE 159* 162* 203* 161* 171*  BUN 35* 37* 31* 33* 31*  CREATININE 1.13 1.13 0.96 1.02 0.98  CALCIUM 8.4* 8.2* 8.4* 8.2* 8.4*  MG 2.8* 2.3 2.4 2.2 2.1  PHOS 3.0 3.1 2.1* 3.2 3.4   GFR: Estimated Creatinine Clearance: 94.9 mL/min (by C-G formula based on SCr of 0.98 mg/dL). Liver Function Tests: Recent Labs  Lab 05/28/19 0349 05/29/19 0331 05/30/19 0848 05/31/19 0443 06/01/19 0340  AST 53* 59* 78* 61* 43*  ALT 40 41 54* 58* 65*  ALKPHOS 38 43 49 44 41  BILITOT 0.5 1.0 1.3* 0.8 0.8  PROT 5.8* 6.1* 6.1* 5.8* 5.7*  ALBUMIN 2.5* 2.8* 2.9* 2.8* 2.8*  CBG: Recent Labs  Lab 05/31/19 1153 05/31/19 1704 05/31/19 2131 06/01/19 0826 06/01/19 1145  GLUCAP 267* 227* 207* 200* 263*   Anemia Panel: Recent Labs    05/31/19 0443 06/01/19 0340  FERRITIN 367* 361*   Sepsis Labs: Recent Labs  Lab 05/26/19 2211 05/26/19 2253  PROCALCITON 0.10  --   LATICACIDVEN  --  1.6    Recent Results (from the past 240 hour(s))  Blood Culture (routine x 2)     Status: None   Collection Time: 05/26/19 10:53 PM   Specimen: BLOOD  Result Value Ref Range Status   Specimen Description BLOOD LEFT ANTECUBITAL  Final   Special Requests   Final     BOTTLES DRAWN AEROBIC ONLY Blood Culture results may not be optimal due to an inadequate volume of blood received in culture bottles   Culture   Final    NO GROWTH 5 DAYS Performed at [facility], [place] [address], [place], [place] [address]    Report Status 05/31/2019 FINAL  Final  Blood Culture (routine x 2)     Status: None   Collection Time: 05/26/19 10:55 PM   Specimen: BLOOD  Result Value Ref Range Status   Specimen Description BLOOD RIGHT ANTECUBITAL  Final   Special Requests   Final    BOTTLES DRAWN AEROBIC AND ANAEROBIC Blood Culture adequate volume   Culture   Final    NO GROWTH 5 DAYS Performed at [facility], [place] [address], [place], [place] [address]  Report Status 05/31/2019 FINAL  Final      Radiology Studies: No results found.   Scheduled Meds: . amLODipine  2.5 mg Oral Daily  . vitamin C  500 mg Oral Daily  . aspirin EC  81 mg Oral Daily  . atorvastatin  40 mg Oral QHS  . chlorpheniramine-HYDROcodone  5 mL Oral Q12H  . cholecalciferol  1,000 Units Oral Daily  . dexamethasone (DECADRON) injection  6 mg Intravenous Q0600  . enoxaparin (LOVENOX) injection  40 mg Subcutaneous QHS  . fluticasone  2 spray Each Nare Daily  . insulin aspart  0-9 Units Subcutaneous TID WC  . Ipratropium-Albuterol  2 puff Inhalation TID  . linagliptin  5 mg Oral Daily  . zinc sulfate  220 mg Oral Daily   Continuous Infusions:   LOS: 6 days    Donnamae Jude, MD 06/01/2019 2:33 PM [phone] [facility] If 7PM-7AM, please contact night-coverage 06/01/2019, 2:33 PM

## 2019-06-02 LAB — GLUCOSE, CAPILLARY
Glucose-Capillary: 191 mg/dL — ABNORMAL HIGH (ref 70–99)
Glucose-Capillary: 252 mg/dL — ABNORMAL HIGH (ref 70–99)

## 2019-06-02 MED ORDER — INSULIN GLARGINE 100 UNIT/ML ~~LOC~~ SOLN: SUBCUTANEOUS | 0 refills | Status: DC

## 2019-06-02 MED ORDER — INSULIN PEN NEEDLE 30G X 8 MM MISC: 1.0000 | 0 refills | Status: DC | PRN

## 2019-06-02 MED ORDER — ACETAMINOPHEN 325 MG PO TABS: 650.0000 mg | ORAL_TABLET | Freq: Four times a day (QID) | ORAL | 0 refills | Status: DC | PRN

## 2019-06-02 MED ORDER — ASCORBIC ACID 500 MG PO TABS: 500.0000 mg | ORAL_TABLET | Freq: Every day | ORAL | 0 refills | Status: DC

## 2019-06-02 MED ORDER — ATORVASTATIN CALCIUM 40 MG PO TABS: 40.0000 mg | ORAL_TABLET | Freq: Every day | ORAL | 0 refills | Status: DC

## 2019-06-02 MED ORDER — IPRATROPIUM-ALBUTEROL 20-100 MCG/ACT IN AERS: 2.0000 | INHALATION_SPRAY | Freq: Four times a day (QID) | RESPIRATORY_TRACT | 0 refills | Status: DC | PRN

## 2019-06-02 MED ORDER — PREDNISONE 10 MG PO TABS: ORAL_TABLET | ORAL | 0 refills | Status: AC

## 2019-06-02 MED ORDER — IPRATROPIUM-ALBUTEROL 20-100 MCG/ACT IN AERS: 2.0000 | INHALATION_SPRAY | Freq: Four times a day (QID) | RESPIRATORY_TRACT | Status: DC | PRN

## 2019-06-02 MED ORDER — INSULIN PEN NEEDLE 30G X 8 MM MISC
1.0000 | Status: DC | PRN
  Filled 2019-06-02: qty 1

## 2019-06-02 MED ORDER — LIP MEDEX EX OINT: 1.0000 "application " | TOPICAL_OINTMENT | CUTANEOUS | 0 refills | Status: DC | PRN

## 2019-06-02 MED ORDER — LEVEMIR FLEXTOUCH 100 UNIT/ML ~~LOC~~ SOPN: PEN_INJECTOR | SUBCUTANEOUS | 0 refills | Status: DC

## 2019-06-02 MED ORDER — ZINC SULFATE 220 (50 ZN) MG PO CAPS: 220.0000 mg | ORAL_CAPSULE | Freq: Every day | ORAL | 0 refills | Status: DC

## 2019-06-02 NOTE — Progress Notes (Signed)
[name] Done to be D/C'd Home per MD order.  Discussed with the patient and all questions fully answered.  VSS, Skin clean, dry and intact without evidence of skin break down, no evidence of skin tears noted. IV catheter discontinued intact. Site without signs and symptoms of complications. Dressing and pressure applied.  An After Visit Summary was printed and given to the patient. Patient received prescription.  D/c education completed with patient/family including follow up instructions, medication list, d/c activities limitations if indicated, with other d/c instructions as indicated by MD - patient able to verbalize understanding, all questions fully answered.   Patient instructed to return to ED, call 911, or call MD for any changes in condition.   Patient escorted via [place], and D/C home via private auto.  Patient was educated on insulin pen teaching and administration prior to d/c home.   [place] 06/02/2019 3:53 PM

## 2019-06-02 NOTE — TOC Initial Note (Addendum)
Transition of Care [facility]) - Initial/Assessment Note    Patient Details  Name: Joseph [name] MRN: [id number] Date of Birth: [birth year]  [facility]) CM/SW Contact:    Maryclare Labrador, RN Phone Number: 06/02/2019, 11:53 AM  Clinical Narrative: PTA Independent from home.  Pt is iinterested in home oxygen to have in case he needs it.  CM explained that home oxygen will not be covered under his insurance because he did not meet walking saturation parameters.  Pt acknowledged that insurance will not cover but request to pay out of pocket. CM offered choice - [employer] was acceptable.   CM contacted [employer] - agency will supply oxygen once pt provides payment.      Per [employer] - pt will need DME order for oxygen even if private pay.  CM discussed situation with attending - attending will not provide order for pt as he does not qualify for home oxygen.  Pt informed and states he understands.                Expected Discharge Plan: Home/Self Care     Patient Goals and CMS Choice   CMS Medicare.gov Compare Post Acute Care list provided to:: Patient Choice offered to / list presented to : Patient  Expected Discharge Plan and Services Expected Discharge Plan: Home/Self Care     Post Acute Care Choice: Durable Medical Equipment Living arrangements for the past 2 months: Single Family Home                 DME Arranged: Oxygen DME Agency: [facility] Date DME Agency Contacted: 06/02/19 Time DME Agency Contacted: [id number] Representative spoke with at DME Agency: [place] Arrangements/Services Living arrangements for the past 2 months: [place]     Do you feel safe going back to the place where you live?: Yes      Need for Family Participation in Patient Care: No (Comment) Care giver support system in place?: Yes (comment)   Criminal Activity/Legal Involvement Pertinent to Current Situation/Hospitalization: No - Comment as needed  Activities of Daily  Living Home Assistive Devices/Equipment: None ADL Screening (condition at time of admission) Patient's cognitive ability adequate to safely complete daily activities?: Yes Is the patient deaf or have difficulty hearing?: No Does the patient have difficulty seeing, even when wearing glasses/contacts?: No Does the patient have difficulty concentrating, remembering, or making decisions?: No Patient able to express need for assistance with ADLs?: Yes Does the patient have difficulty dressing or bathing?: No Independently performs ADLs?: Yes (appropriate for developmental age) Does the patient have difficulty walking or climbing stairs?: No Weakness of Legs: None Weakness of Arms/Hands: None  Permission Sought/Granted   Permission granted to share information with : Yes, Verbal Permission Granted              Emotional Assessment   Attitude/Demeanor/Rapport: Self-Confident, Engaged Affect (typically observed): Accepting Orientation: : Oriented to Self, Oriented to Place, Oriented to  Time, Oriented to Situation   Psych Involvement: No (comment)  Admission diagnosis:  Hypoxia [R09.02] Acute hypoxemic respiratory failure due to COVID-19 ([place]) [U07.1, J96.01] COVID-19 virus infection [U07.1] Patient Active Problem List   Diagnosis Date Noted  . Acute hypoxemic respiratory failure due to COVID-19 ([place]) 05/26/2019  . Hypokalemia 05/26/2019  . Elevated LFTs 08/26/2015  . Diabetes mellitus, type 2 ([place]) 08/19/2015  . Hyperlipidemia associated with type 2 diabetes mellitus ([place])   . Hypertension  associated with diabetes ([place])   . Allergy    PCP:  Susy Frizzle, MD Pharmacy:   [id number] - [place], [place] [address] [place] [address] Phone: [phone] Fax: [phone]     Social Determinants of Health (SDOH) Interventions    Readmission Risk Interventions No flowsheet data found.

## 2019-06-02 NOTE — Progress Notes (Signed)
Inpatient Diabetes Program Recommendations  AACE/ADA: New Consensus Statement on Inpatient Glycemic Control (2015)  Target Ranges:  Prepandial:   less than 140 mg/dL      Peak postprandial:   less than 180 mg/dL (1-2 hours)      Critically ill patients:  140 - 180 mg/dL   Lab Results  Component Value Date   GLUCAP 252 (H) 06/02/2019   HGBA1C 8.4 (H) 05/27/2019    Review of Glycemic Control Results for Joseph [name], Joseph [name] (MRN [id number]) as of 06/02/2019 13:41  Ref. Range 06/01/2019 16:44 06/01/2019 21:03 06/02/2019 08:27 06/02/2019 11:27  Glucose-Capillary Latest Ref Range: 70 - 99 mg/dL 235 (H) 257 (H) 191 (H) 252 (H)   Diabetes history: Type 2 DM Outpatient Diabetes medications: Trulicity 1.5 mg Qwk, Jardiance 25 mg QD, Metformin 1000 mg BID Current orders for Inpatient glycemic control: Tradjenta 5 mg QD, Novolog 0-9 units TID  Inpatient Diabetes Program Recommendations:    Spoke with patient regarding outpatient diabetes and plan for discharge.  Reviewed patient's current A1c of 8.4%. Explained what a A1c is and what it measures. Also reviewed goal A1c with patient, importance of good glucose control @ home, and blood sugar goals. Reviewed discharge orders, basal insulin, hypo vs hyper glycemia, interventions, and when to call MD.  Encouraged to be mindful of carbohydrate intake and limit sugary beverages.  Reviewed insulin pen, sent over insulin pen injection videos and encouraged Joseph [name] to demonstrate insulin pen prior to discharge.  Patient has no further questions at this time.  Messaged MD regarding discharge orders, to include Levemir insulin pen [phone], as this is covered by insurance and will pair with insulin pen needles.   Thanks, Bronson Curb, MSN, RNC-OB Diabetes Coordinator [phone] (8a-5p)

## 2019-06-02 NOTE — Plan of Care (Signed)
  Problem: Education: Goal: Knowledge of risk factors and measures for prevention of condition will improve Outcome: Progressing   Problem: Coping: Goal: Psychosocial and spiritual needs will be supported Outcome: Progressing   Problem: Respiratory: Goal: Will maintain a patent airway Outcome: Progressing Goal: Complications related to the disease process, condition or treatment will be avoided or minimized Outcome: Progressing   Problem: Education: Goal: Knowledge of General Education information will improve Description: Including pain rating scale, medication(s)/side effects and non-pharmacologic comfort measures Outcome: Progressing   Problem: Health Behavior/Discharge Planning: Goal: Ability to manage health-related needs will improve Outcome: Progressing   Problem: Clinical Measurements: Goal: Ability to maintain clinical measurements within normal limits will improve Outcome: Progressing Goal: Will remain free from infection Outcome: Progressing Goal: Diagnostic test results will improve Outcome: Progressing Goal: Respiratory complications will improve Outcome: Progressing Goal: Cardiovascular complication will be avoided Outcome: Progressing   Problem: Activity: Goal: Risk for activity intolerance will decrease Outcome: Progressing   Problem: Nutrition: Goal: Adequate nutrition will be maintained Outcome: Progressing   Problem: Coping: Goal: Level of anxiety will decrease Outcome: Progressing   Problem: Elimination: Goal: Will not experience complications related to bowel motility Outcome: Progressing Goal: Will not experience complications related to urinary retention Outcome: Progressing   Problem: Pain Managment: Goal: General experience of comfort will improve Outcome: Progressing   Problem: Safety: Goal: Ability to remain free from injury will improve Outcome: Progressing   Problem: Skin Integrity: Goal: Risk for impaired skin integrity will  decrease Outcome: Progressing   Problem: Respiratory: Goal: Ability to maintain adequate ventilation will improve Outcome: Progressing

## 2019-06-03 NOTE — Discharge Summary (Signed)
Physician Discharge Summary  Joseph [name] JIR:[id number] DOB: February 21, 1961 DOA: 05/26/2019  PCP: Susy Frizzle, MD  Admit date: 05/26/2019 Discharge date: 06/03/2019  Recommendations for Outpatient Follow-up:  1. Follow up with PCP in 7-10 days. 2. Check blood glucoses each morning and each evening before dinner. Record and take into visit with PCP. 3. Recommend sleep study as outpatient.  Discharge Diagnoses: Principal diagnosis is #1 1. Acute hypoxic respiratory failure 2. COVID 19 pneumonia 3. DM II 4. Essential hypertension 5. Hypokalemia 6. Hyperlipidemia  Discharge Condition: Fair  Disposition: Home  Diet recommendation: Heart healthy with modified carbohydrates  Filed Weights   05/27/19 0449 06/01/19 0437 06/02/19 0513  Weight: 99 kg 96.7 kg 97 kg    History of present illness:  Joseph [name] is a 59 y.o. male with medical history significant for type 2 diabetes, hypertension, and hyperlipidemia who presents to the ED for evaluation of cough, dyspnea, and pneumonia in the setting of positive COVID-19 test.  Patient reports initially developing symptoms on Saturday, 05/17/2019 which included fevers, chills, cough with occasional white to yellowish sputum production, and sinus congestion.  He went to urgent care on 05/18/2019 and was started on a course of doxycycline.  He was also tested for COVID-19 which results the next day and was negative.  He is having continued symptoms and loss his appetite and was no longer eating or drinking adequately.  He reported having some associated diarrhea and shortness of breath with activity.  He went back to urgent care on 05/21/2019.  A repeat COVID-19 test was obtained and he was told it returned positive the following day.  Due to continued and worsening symptoms he again returned to urgent care on 05/26/2019.  He had a chest x-ray obtained which showed bilateral infiltrates.  He says his oxygen saturation was dropping to 91%.  He was  advised to present to the ED for further evaluation.  ED Course:  Initial vitals showed BP 112/87, pulse 105, RR 24, temp 99.9 Fahrenheit, SPO2 94% on 3 L supplemental O2 via .  Patient reportedly desaturated to mid 80s on room air.  Labs are notable for potassium 3.0, sodium 140, bicarb 21, BUN 29, creatinine 1.13, serum glucose 147, AST 57, ALT 49, alk phos 43, total bilirubin 0.5, WBC 9.4, hemoglobin 16.3, platelets 208,000, D-dimer 0.74, LDH 443, procalcitonin 0.10, lactic acid 1.6.  POC SARS-CoV-2 antigen test is positive.  Blood cultures were obtained and pending.  Portable chest x-ray shows bilateral infiltrates in the mid lung fields.  The hospitalist service was consulted to admit for further evaluation and management.  Hospital Course:  59 year old with history of DM2, HTN, HLD comes to the hospital with complaints of cough, dyspnea and pneumonia. Went to urgent care with these symptoms on 1/17, COVID-19 was negative and he was placed on doxycycline. Then again started having diarrhea and went back to urgent care on 1/20 and the testing was positive for COVID-19 that day. Worsening of the symptoms on 1/25. returned back to urgent care on 1/25, chest x-ray showed infiltrates therefore sent to ER.In the hospital started on remdesivir and Decadron, given a dose of Actemra on 1/26.Patient weaned to room air on the afternoon of 1/30.  Today's assessment: S: The patient is resting quietly. No new complaints.  O: Vitals:  Vitals:   06/02/19 0956 06/02/19 1302  BP: (!) 163/96 126/84  Pulse: 87 90  Resp: 18 19  Temp: 97.6 F (36.4 C) 98.6 F (37 C)  SpO2: 96%  94%   Exam:  Constitutional:  . The patient is awake, alert, and oriented x 3. No acute distress. Respiratory:  . No increased work of breathing. . No wheezes, rales, or rhonchi . No tactile fremitus Cardiovascular:  . Regular rate and rhythm . No murmurs, ectopy, or gallups. . No lateral PMI. No  thrills. Abdomen:  . Abdomen is soft, non-tender, non-distended . No hernias, masses, or organomegaly . Normoactive bowel sounds.  Musculoskeletal:  . No cyanosis, clubbing, or edema Skin:  . No rashes, lesions, ulcers . palpation of skin: no induration or nodules Neurologic:  . CN 2-12 intact . Sensation all 4 extremities intact Psychiatric:  . Mental status o Mood, affect appropriate o Orientation to person, place, time  . judgment and insight appear intact   Discharge Instructions  Discharge Instructions    MyChart COVID-19 home monitoring program   Complete by: Jun 02, 2019    Is the patient willing to use the [place] for home monitoring?: Yes   Activity as tolerated - No restrictions   Complete by: As directed    Call MD for:  difficulty breathing, headache or visual disturbances   Complete by: As directed    Call MD for:  temperature >100.4   Complete by: As directed    Diet - low sodium heart healthy   Complete by: As directed    Diet Carb Modified   Complete by: As directed    Discharge instructions   Complete by: As directed    Follow up with PCP in 7-10 days. Check blood glucoses each morning and each evening before dinner. Record and take into visit with PCP. Recommend sleep study as outpatient.   Increase activity slowly   Complete by: As directed      Allergies as of 06/02/2019   No Known Allergies     Medication List    TAKE these medications   acetaminophen 325 MG tablet Commonly known as: TYLENOL Take 2 tablets (650 mg total) by mouth every 6 (six) hours as needed for mild pain or headache (fever >/= 101).   amLODipine 5 MG tablet Commonly known as: NORVASC Take 1 tablet (5 mg total) by mouth daily.   ascorbic acid 500 MG tablet Commonly known as: VITAMIN C Take 1 tablet (500 mg total) by mouth daily.   aspirin 81 MG tablet Take 81 mg by mouth daily.   atorvastatin 40 MG tablet Commonly known as: LIPITOR Take 1 tablet (40  mg total) by mouth at bedtime. What changed:   medication strength  how much to take   blood glucose meter kit and supplies Kit Dispense based on patient and insurance preference. Check fasting blood sugar each morning.  E11.65   Dulaglutide 1.5 MG/0.5ML Sopn Commonly known as: Trulicity Inject 1.5 mg into the skin once a week. What changed: when to take this   famotidine 10 MG chewable tablet Commonly known as: PEPCID AC Chew 10 mg by mouth as needed for heartburn.   fluticasone 50 MCG/ACT nasal spray Commonly known as: FLONASE Place 2 sprays into both nostrils daily.   Insulin Pen Needle 30G X 8 MM Misc Commonly known as: NOVOFINE Inject 10 each into the skin as needed (Daily while on lantus.).   Ipratropium-Albuterol 20-100 MCG/ACT Aers respimat Commonly known as: COMBIVENT Inhale 2 puffs into the lungs every 6 (six) hours as needed for wheezing.   Jardiance 25 MG Tabs tablet Generic drug: empagliflozin TAKE 1 TABLET BY MOUTH EVERY DAY  What changed: how much to take   Lancets 30G Misc 1 each by Does not apply route daily.   Levemir FlexTouch 100 UNIT/ML Pen Generic drug: Insulin Detemir Inject 15 Units into the skin daily for 3 days, THEN 10 Units daily for 3 days, THEN 5 Units daily for 3 days. Start taking on: June 02, 2019   lip balm ointment Apply 1 application topically as needed for lip care (cold sores).   lisinopril-hydrochlorothiazide 20-12.5 MG tablet Commonly known as: ZESTORETIC Take 2 tablets by mouth daily.   loratadine 10 MG tablet Commonly known as: CLARITIN Take 10 mg by mouth daily as needed for allergies.   metFORMIN 1000 MG tablet Commonly known as: GLUCOPHAGE TAKE 1 TABLET BY MOUTH TWICE A DAY WITH MEALS   MULTIVITAMIN PO Take 1 tablet by mouth daily.   ONE TOUCH ULTRA TEST test strip Generic drug: glucose blood CHECK FASTING BLOOD SUGAR EACH MORNING   predniSONE 10 MG tablet Commonly known as: DELTASONE Take 4 tablets (40  mg total) by mouth daily for 3 days, THEN 2 tablets (20 mg total) daily for 3 days, THEN 1 tablet (10 mg total) daily for 3 days. Start taking on: June 02, 2019   zinc sulfate 220 (50 Zn) MG capsule Take 1 capsule (220 mg total) by mouth daily.      No Known Allergies  The results of significant diagnostics from this hospitalization (including imaging, microbiology, ancillary and laboratory) are listed below for reference.    Significant Diagnostic Studies: DG Chest Port 1 View  Result Date: 05/26/2019 CLINICAL DATA:  Shortness of breath. EXAM: PORTABLE CHEST 1 VIEW COMPARISON:  None. FINDINGS: Mild infiltrates are seen within the mid lung fields, bilaterally. There is no evidence of a pleural effusion or pneumothorax. The heart size and mediastinal contours are within normal limits. The visualized skeletal structures are unremarkable. IMPRESSION: 1. Mild bilateral infiltrates. Electronically Signed   By: Virgina Norfolk M.D.   On: 05/26/2019 22:26    Microbiology: Recent Results (from the past 240 hour(s))  Blood Culture (routine x 2)     Status: None   Collection Time: 05/26/19 10:53 PM   Specimen: BLOOD  Result Value Ref Range Status   Specimen Description BLOOD LEFT ANTECUBITAL  Final   Special Requests   Final    BOTTLES DRAWN AEROBIC ONLY Blood Culture results may not be optimal due to an inadequate volume of blood received in culture bottles   Culture   Final    NO GROWTH 5 DAYS Performed at [facility], [employer] [address] [place], [place] [address]    Report Status 05/31/2019 FINAL  Final  Blood Culture (routine x 2)     Status: None   Collection Time: 05/26/19 10:55 PM   Specimen: BLOOD  Result Value Ref Range Status   Specimen Description BLOOD RIGHT ANTECUBITAL  Final   Special Requests   Final    BOTTLES DRAWN AEROBIC AND ANAEROBIC Blood Culture adequate volume   Culture   Final    NO GROWTH 5 DAYS Performed at [facility], [place] [address]  [place], [place] [address]    Report Status 05/31/2019 FINAL  Final     Labs: Basic Metabolic Panel: Recent Labs  Lab 05/28/19 0349 05/29/19 0331 05/30/19 0848 05/31/19 0443 06/01/19 0340  NA 142 143 140 141 141  K 3.3* 3.5 4.0 3.1* 3.3*  CL 105 103 106 106 105  CO2 _0 27  GLUCOSE 159* 162* 203* 161* 171*  BUN 35* 37* 31* 33* 31*  CREATININE 1.13 1.13 0.96 1.02 0.98  CALCIUM 8.4* 8.2* 8.4* 8.2* 8.4*  MG 2.8* 2.3 2.4 2.2 2.1  PHOS 3.0 3.1 2.1* 3.2 3.4   Liver Function Tests: Recent Labs  Lab 05/28/19 0349 05/29/19 0331 05/30/19 0848 05/31/19 0443 06/01/19 0340  AST 53* 59* 78* 61* 43*  ALT 40 41 54* 58* 65*  ALKPHOS 38 43 49 44 41  BILITOT 0.5 1.0 1.3* 0.8 0.8  PROT 5.8* 6.1* 6.1* 5.8* 5.7*  ALBUMIN 2.5* 2.8* 2.9* 2.8* 2.8*   No results for input(s): LIPASE, AMYLASE in the last 168 hours. No results for input(s): AMMONIA in the last 168 hours. CBC: Recent Labs  Lab 05/28/19 0349 05/29/19 0331 05/30/19 0848 05/31/19 0443 06/01/19 0340  WBC 7.2 7.8 7.7 8.7 9.7  NEUTROABS 5.0 6.0 5.9 5.4 6.3  HGB 15.8 16.5 17.3* 16.3 16.1  HCT 47.2 50.1 52.4* 48.8 47.5  MCV 84.1 84.6 85.6 83.7 83.0  PLT 268 323 411* 359 364   Cardiac Enzymes: No results for input(s): CKTOTAL, CKMB, CKMBINDEX, TROPONINI in the last 168 hours. BNP: BNP (last 3 results) Recent Labs    05/28/19 0353  BNP 798.6*    ProBNP (last 3 results) No results for input(s): PROBNP in the last 8760 hours.  CBG: Recent Labs  Lab 06/01/19 1145 06/01/19 1644 06/01/19 2103 06/02/19 0827 06/02/19 1127  GLUCAP 263* 235* 257* 191* 252*    Principal Problem:   Acute hypoxemic respiratory failure due to COVID-19 [facility]) Active Problems:   Hyperlipidemia associated with type 2 diabetes mellitus ([place])   Hypertension associated with diabetes ([place])   Diabetes mellitus, type 2 (HCC)   Hypokalemia   Time coordinating discharge: 38 minutes  Signed:        Annalee Meyerhoff, DO [facility]  06/03/2019, 5:22 PM

## 2019-06-10 ENCOUNTER — Other Ambulatory Visit: Payer: Self-pay

## 2019-06-10 ENCOUNTER — Ambulatory Visit (INDEPENDENT_AMBULATORY_CARE_PROVIDER_SITE_OTHER): Payer: No Typology Code available for payment source | Admitting: Family Medicine

## 2019-06-10 DIAGNOSIS — I10 Essential (primary) hypertension: Secondary | ICD-10-CM

## 2019-06-10 DIAGNOSIS — E1165 Type 2 diabetes mellitus with hyperglycemia: Secondary | ICD-10-CM | POA: Diagnosis not present

## 2019-06-10 DIAGNOSIS — Z09 Encounter for follow-up examination after completed treatment for conditions other than malignant neoplasm: Secondary | ICD-10-CM

## 2019-06-10 DIAGNOSIS — E785 Hyperlipidemia, unspecified: Secondary | ICD-10-CM | POA: Diagnosis not present

## 2019-06-10 MED ORDER — NYSTATIN 100000 UNIT/ML MT SUSP: 5.0000 mL | Freq: Four times a day (QID) | OROMUCOSAL | 0 refills | Status: DC

## 2019-06-10 NOTE — Progress Notes (Signed)
Subjective:    Patient ID: Joseph [name], male    DOB: May 01, 1961, 59 y.o.   MRN: [id number]  HPI  Patient is being seen today as a telephone visit.  He consents to be seen via telephone.  Phone call began at 320.  Phone call concluded at 340.  Patient was recently admitted to the hospital with COVID-19 pneumonia and hypoxic respiratory failure.  I have copied relevant portions of the discharge summary below and included them for my reference:Admit date: 05/26/2019 Discharge date: 06/03/2019  Recommendations for Outpatient Follow-up:  1. Follow up with PCP in 7-10 days. 2. Check blood glucoses each morning and each evening before dinner. Record and take into visit with PCP. 3. Recommend sleep study as outpatient.  Discharge Diagnoses: Principal diagnosis is #1 1. Acute hypoxic respiratory failure 2. COVID 19 pneumonia 3. DM II 4. Essential hypertension 5. Hypokalemia 6. Hyperlipidemia  Discharge Condition: Fair  Disposition: Home  Diet recommendation: Heart healthy with modified carbohydrates       Filed Weights   05/27/19 0449 06/01/19 0437 06/02/19 0513  Weight: 99 kg 96.7 kg 97 kg    History of present illness:  Joseph [name] a 59 y.o.malewith medical history significant fortype 2 diabetes, hypertension, and hyperlipidemia who presents to the ED for evaluation of cough, dyspnea, and pneumonia in the setting of positive COVID-19 test.  Patient reports initially developing symptoms on Saturday, 05/17/2019 which included fevers, chills, cough with occasional white to yellowish sputum production, and sinus congestion. He went to urgent care on 05/18/2019 and was started on a course of doxycycline. He was also tested for COVID-19 which results the next day and was negative. He is having continued symptoms and loss his appetite and was no longer eating or drinking adequately. He reported having some associated diarrhea and shortness of breath with activity. He went  back to urgent care on 05/21/2019. A repeat COVID-19 test was obtained and he was told it returnedpositive the following day. Due to continued and worsening symptoms he again returned to urgent care on 05/26/2019. He had a chest x-ray obtained which showed bilateral infiltrates. He says his oxygen saturation was dropping to 91%. He was advised to present to the ED for further evaluation.  ED Course: Initial vitals showed BP 112/87, pulse 105, RR 24, temp 99.9 Fahrenheit, SPO2 94% on 3 L supplemental O2 via [place]. Patient reportedly desaturated to mid 80s on room air.  Labs are notable for potassium 3.0, sodium 140, bicarb 21, BUN 29, creatinine 1.13, serum glucose 147, AST 57, ALT 49, alk phos 43, total bilirubin 0.5, WBC 9.4, hemoglobin 16.3, platelets 208,000, D-dimer 0.74, LDH 443, procalcitonin 0.10, lactic acid 1.6.  POC SARS-CoV-2 antigen test is positive. Blood cultures were obtained and pending.  Portable chest x-ray shows bilateral infiltrates in the mid lung fields.  The hospitalist service was consulted to admit for further evaluation and management.  Hospital Course:  59 year old with history of DM2, HTN, HLD comes to the hospital with complaints of cough, dyspnea and pneumonia. Went to urgent care with these symptoms on 1/17, COVID-19 was negative and he was placed on doxycycline. Then again started having diarrhea and went back to urgent care on 1/20 and the testing was positive for COVID-19 that day. Worsening of the symptoms on 1/25. returned back to urgent care on 1/25, chest x-ray showed infiltrates therefore sent to ER.In the hospital started on remdesivir and Decadron, given a dose of Actemra on 1/26.Patient weaned to room air on  the afternoon of 1/30.   Patient states that he is doing better.  Initially he felt extremely tired after leaving the hospital.  He has been home approximately 1 week now and is starting to back his stamina.  Today he went for a walk  with his girlfriend and was able to doing well.  He is maintaining his oxygen saturations between 90 to 94%.  During the entire ordeal he lost approximately 20 pounds.  Over the last week he has gained 3 pounds back.  He has been weaning down on his prednisone gradually since discharge from the hospital.  He takes his last dose of prednisone tomorrow.  He is also taking Levemir 5 units a day.  Tomorrow will be his last dose of Levemir.  His blood sugars have been highly variable.  For instance his fasting blood sugars the last 4 days and then 115, 136, 228, and 202.  He is taking Jardiance.  He is taking Metformin.  He is not taking his Trulicity yet.  His plan is to resume Trulicity on Sunday.  His hemoglobin A1c at hospital admission was greater than 8 suggesting that his preadmission glycemic control is poor.  However now that he has lost 20 pounds, he believes that his sugars have been doing much better. Past Medical History:  Diagnosis Date  . Allergy   . Diabetes mellitus without complication ([place])   . Hyperlipidemia   . Hypertension    Past Surgical History:  Procedure Laterality Date  . COLONOSCOPY  2015  . INSERTION OF MESH N/A 10/31/2017   Procedure: INSERTION OF MESH;  Surgeon: Coralie Keens, MD;  Location: [facility];  Service: General;  Laterality: N/A;  . UMBILICAL HERNIA REPAIR N/A 10/31/2017   Procedure: UMBILICAL HERNIA REPAIR WITH MESH;  Surgeon: Coralie Keens, MD;  Location: [facility];  Service: General;  Laterality: N/A;  . VASECTOMY  2006   Current Outpatient Medications on File Prior to Visit  Medication Sig Dispense Refill  . acetaminophen (TYLENOL) 325 MG tablet Take 2 tablets (650 mg total) by mouth every 6 (six) hours as needed for mild pain or headache (fever >/= 101). 30 tablet 0  . amLODipine (NORVASC) 5 MG tablet Take 1 tablet (5 mg total) by mouth daily. 90 tablet 3  . ascorbic acid (VITAMIN C) 500 MG tablet Take 1 tablet (500 mg total) by mouth daily. 10 tablet 0  .  aspirin 81 MG tablet Take 81 mg by mouth daily.    Marland Kitchen atorvastatin (LIPITOR) 40 MG tablet Take 1 tablet (40 mg total) by mouth at bedtime. 30 tablet 0  . blood glucose meter kit and supplies KIT Dispense based on patient and insurance preference. Check fasting blood sugar each morning.  E11.65 1 each 0  . Dulaglutide (TRULICITY) 1.5 HY/8.5OY SOPN Inject 1.5 mg into the skin once a week. (Patient taking differently: Inject 1.5 mg into the skin every Sunday. ) 12 pen 3  . famotidine (PEPCID AC) 10 MG chewable tablet Chew 10 mg by mouth as needed for heartburn.    . fluticasone (FLONASE) 50 MCG/ACT nasal spray Place 2 sprays into both nostrils daily. 48 g 11  . Insulin Detemir (LEVEMIR FLEXTOUCH) 100 UNIT/ML Pen Inject 15 Units into the skin daily for 3 days, THEN 10 Units daily for 3 days, THEN 5 Units daily for 3 days. 3 mL 0  . Insulin Pen Needle (NOVOFINE) 30G X 8 MM MISC Inject 10 each into the skin as needed (Daily while  on lantus.). 90 each 0  . Ipratropium-Albuterol (COMBIVENT) 20-100 MCG/ACT AERS respimat Inhale 2 puffs into the lungs every 6 (six) hours as needed for wheezing. 4 g 0  . JARDIANCE 25 MG TABS tablet TAKE 1 TABLET BY MOUTH EVERY DAY (Patient taking differently: Take 25 mg by mouth daily. ) 90 tablet 0  . Lancets 30G MISC 1 each by Does not apply route daily. 100 each 3  . lip balm (CARMEX) ointment Apply 1 application topically as needed for lip care (cold sores). 7 g 0  . lisinopril-hydrochlorothiazide (ZESTORETIC) 20-12.5 MG tablet Take 2 tablets by mouth daily. 180 tablet 3  . loratadine (CLARITIN) 10 MG tablet Take 10 mg by mouth daily as needed for allergies.    . metFORMIN (GLUCOPHAGE) 1000 MG tablet TAKE 1 TABLET BY MOUTH TWICE A DAY WITH MEALS (Patient taking differently: Take 1,000 mg by mouth 2 (two) times daily with a meal. ) 180 tablet 1  . Multiple Vitamins-Minerals (MULTIVITAMIN PO) Take 1 tablet by mouth daily.     . ONE TOUCH ULTRA TEST test strip CHECK FASTING  BLOOD SUGAR EACH MORNING 100 each 3  . predniSONE (DELTASONE) 10 MG tablet Take 4 tablets (40 mg total) by mouth daily for 3 days, THEN 2 tablets (20 mg total) daily for 3 days, THEN 1 tablet (10 mg total) daily for 3 days. 30 tablet 0  . zinc sulfate 220 (50 Zn) MG capsule Take 1 capsule (220 mg total) by mouth daily. 10 capsule 0   No current facility-administered medications on file prior to visit.   No Known Allergies Social History   Socioeconomic History  . Marital status: Married    Spouse name: Not on file  . Number of children: Not on file  . Years of education: Not on file  . Highest education level: Not on file  Occupational History  . Not on file  Tobacco Use  . Smoking status: Never Smoker  . Smokeless tobacco: Never Used  Substance and Sexual Activity  . Alcohol use: No  . Drug use: No  . Sexual activity: Yes    Comment: married to [place].  Three kids.  exercising daily.  Other Topics Concern  . Not on file  Social History Narrative  . Not on file   Social Determinants of Health   Financial Resource Strain:   . Difficulty of Paying Living Expenses: Not on file  Food Insecurity:   . Worried About Charity fundraiser in the Last Year: Not on file  . Ran Out of Food in the Last Year: Not on file  Transportation Needs:   . Lack of Transportation (Medical): Not on file  . Lack of Transportation (Non-Medical): Not on file  Physical Activity:   . Days of Exercise per Week: Not on file  . Minutes of Exercise per Session: Not on file  Stress:   . Feeling of Stress : Not on file  Social Connections:   . Frequency of Communication with Friends and Family: Not on file  . Frequency of Social Gatherings with Friends and Family: Not on file  . Attends Religious Services: Not on file  . Active Member of Clubs or Organizations: Not on file  . Attends Archivist Meetings: Not on file  . Marital Status: Not on file  Intimate Partner Violence:   . Fear of  Current or Ex-Partner: Not on file  . Emotionally Abused: Not on file  . Physically Abused: Not on file  .  Sexually Abused: Not on file     Review of Systems  All other systems reviewed and are negative.      Objective:   Physical Exam        Assessment & Plan:  Hospital discharge follow-up  Essential hypertension  Uncontrolled type 2 diabetes mellitus with hyperglycemia, without long-term current use of insulin (HCC)  Hyperlipidemia, unspecified hyperlipidemia type  Patient seems like he is doing much better.  He seems to be recovering well from his recent Covid infection and he is no longer on oxygen.  I anticipate that over the next 2 to 3 weeks he should gradually regain his strength and return to normal.  I have recommended that he stop his insulin tomorrow and resume his Trulicity.  Of asked him to check his fasting blood sugars and 2-hour postprandial sugars every day and report the values to me in 3 to 4 weeks.  This would give the Metformin sufficient time to return to full strength in his system given the fact they have discontinued it in the hospital.  In 4 weeks, if his fasting blood sugars are greater than 130 and/or his 2-hour postprandial sugars are greater than 160 I would recommend adding additional medication to control his blood sugar whether that the Actos or potentially starting the patient on insulin permanently such as Lantus or Levemir.  Patient is due to schedule physical exam.  He plans to do this in 4 weeks and we will discuss this further at that appointment.

## 2019-06-20 ENCOUNTER — Other Ambulatory Visit: Payer: Self-pay | Admitting: Family Medicine

## 2019-06-23 ENCOUNTER — Telehealth: Payer: Self-pay | Admitting: Family Medicine

## 2019-06-23 NOTE — Telephone Encounter (Signed)
Pt called and states that he is done with the Nystatin and while it is better his tongue is still sensitive and he has a couple of Canker sores he can not get rid of.  CB# [phone]

## 2019-06-23 NOTE — Telephone Encounter (Signed)
Can use triamcinolone dental paste applied bid to the canker sores.

## 2019-06-25 MED ORDER — TRIAMCINOLONE ACETONIDE 0.1 % MT PSTE: 1.0000 "application " | PASTE | Freq: Two times a day (BID) | OROMUCOSAL | 2 refills | Status: DC

## 2019-07-06 ENCOUNTER — Other Ambulatory Visit: Payer: Self-pay | Admitting: Family Medicine

## 2019-07-06 DIAGNOSIS — E1165 Type 2 diabetes mellitus with hyperglycemia: Secondary | ICD-10-CM

## 2019-07-07 MED ORDER — METFORMIN HCL 1000 MG PO TABS: 1000.0000 mg | ORAL_TABLET | Freq: Two times a day (BID) | ORAL | 1 refills | Status: DC

## 2019-07-11 ENCOUNTER — Other Ambulatory Visit: Payer: Self-pay | Admitting: Family Medicine

## 2019-07-12 ENCOUNTER — Other Ambulatory Visit: Payer: Self-pay | Admitting: Family Medicine

## 2019-08-04 ENCOUNTER — Other Ambulatory Visit: Payer: Self-pay | Admitting: Family Medicine

## 2019-08-18 ENCOUNTER — Other Ambulatory Visit: Payer: 59

## 2019-08-18 ENCOUNTER — Other Ambulatory Visit: Payer: Self-pay

## 2019-08-18 ENCOUNTER — Other Ambulatory Visit: Payer: Self-pay | Admitting: Family Medicine

## 2019-08-18 DIAGNOSIS — Z0000 Encounter for general adult medical examination without abnormal findings: Secondary | ICD-10-CM

## 2019-08-19 ENCOUNTER — Other Ambulatory Visit: Payer: 59

## 2019-08-19 ENCOUNTER — Other Ambulatory Visit: Payer: Self-pay

## 2019-08-19 DIAGNOSIS — Z0000 Encounter for general adult medical examination without abnormal findings: Secondary | ICD-10-CM

## 2019-08-21 ENCOUNTER — Other Ambulatory Visit: Payer: Self-pay

## 2019-08-21 ENCOUNTER — Encounter: Payer: Self-pay | Admitting: Family Medicine

## 2019-08-21 ENCOUNTER — Ambulatory Visit (INDEPENDENT_AMBULATORY_CARE_PROVIDER_SITE_OTHER): Payer: 59 | Admitting: Family Medicine

## 2019-08-21 VITALS — BP 140/90 | HR 86 | Temp 97.2°F | Resp 18 | Ht 69.0 in | Wt 220.0 lb

## 2019-08-21 DIAGNOSIS — Z8616 Personal history of COVID-19: Secondary | ICD-10-CM

## 2019-08-21 DIAGNOSIS — E785 Hyperlipidemia, unspecified: Secondary | ICD-10-CM | POA: Diagnosis not present

## 2019-08-21 DIAGNOSIS — I10 Essential (primary) hypertension: Secondary | ICD-10-CM | POA: Diagnosis not present

## 2019-08-21 DIAGNOSIS — E1100 Type 2 diabetes mellitus with hyperosmolarity without nonketotic hyperglycemic-hyperosmolar coma (NKHHC): Secondary | ICD-10-CM | POA: Diagnosis not present

## 2019-08-21 DIAGNOSIS — Z0001 Encounter for general adult medical examination with abnormal findings: Secondary | ICD-10-CM

## 2019-08-21 DIAGNOSIS — Z0000 Encounter for general adult medical examination without abnormal findings: Secondary | ICD-10-CM

## 2019-08-21 DIAGNOSIS — J1282 Pneumonia due to coronavirus disease 2019: Secondary | ICD-10-CM

## 2019-08-21 DIAGNOSIS — U071 COVID-19: Secondary | ICD-10-CM

## 2019-08-21 NOTE — Progress Notes (Signed)
Subjective:    Patient ID: Joseph [name], male    DOB: 1960/08/27, 59 y.o.   MRN: [id number]  Patient is here for CPE today. Had Bridger in January.  His last colonoscopy was in 2014.  He is not due for repeat colonoscopy until 2024.  His PSA was recently checked and his lab work and was outstanding.  He is still due for his hemoglobin A1c which is pending at this time.  The remainder of his lab work is listed below.  Noticeably his hemoglobin is elevated at 17.7.  I question if some of this could be due to the hypoxia that he experienced while battling Covid.  He was hospitalized for more than a week with this.  However the patient would also be at high risk for obstructive sleep apnea given his body habitus.  Also on the lab work, his liver function tests and kidney tests were normal.  His cholesterol is outstanding except for his low HDL cholesterol.  His PSA was normal. Lab on 08/19/2019  Component Date Value Ref Range Status  . WBC 08/19/2019 8.7  3.8 - 10.8 Thousand/uL Final  . RBC 08/19/2019 6.15* 4.20 - 5.80 Million/uL Final  . Hemoglobin 08/19/2019 17.7* 13.2 - 17.1 g/dL Final  . HCT 08/19/2019 53.4* 38.5 - 50.0 % Final  . MCV 08/19/2019 86.8  80.0 - 100.0 fL Final  . MCH 08/19/2019 28.8  27.0 - 33.0 pg Final  . MCHC 08/19/2019 33.1  32.0 - 36.0 g/dL Final  . RDW 08/19/2019 15.4* 11.0 - 15.0 % Final  . Platelets 08/19/2019 273  140 - 400 Thousand/uL Final  . MPV 08/19/2019 11.2  7.5 - 12.5 fL Final  . Neutro Abs 08/19/2019 4,481  1,500 - 7,800 cells/uL Final  . Lymphs Abs 08/19/2019 2,975  850 - 3,900 cells/uL Final  . Absolute Monocytes 08/19/2019 740  200 - 950 cells/uL Final  . Eosinophils Absolute 08/19/2019 409  15 - 500 cells/uL Final  . Basophils Absolute 08/19/2019 96  0 - 200 cells/uL Final  . Neutrophils Relative % 08/19/2019 51.5  % Final  . Total Lymphocyte 08/19/2019 34.2  % Final  . Monocytes Relative 08/19/2019 8.5  % Final  . Eosinophils Relative 08/19/2019 4.7  %  Final  . Basophils Relative 08/19/2019 1.1  % Final  . Glucose, Bld 08/19/2019 116* 65 - 99 mg/dL Final   Comment: .            Fasting reference interval . For someone without known diabetes, a glucose value between 100 and 125 mg/dL is consistent with prediabetes and should be confirmed with a follow-up test. .   . BUN 08/19/2019 20  7 - 25 mg/dL Final  . Creat 08/19/2019 0.88  0.70 - 1.33 mg/dL Final   Comment: For patients >76 years of age, the reference limit for Creatinine is approximately 13% higher for people identified as African-American. .   Havery Moros Ratio 40/98/1191 NOT APPLICABLE  6 - 22 (calc) Final  . Sodium 08/19/2019 141  135 - 146 mmol/L Final  . Potassium 08/19/2019 3.5  3.5 - 5.3 mmol/L Final  . Chloride 08/19/2019 102  98 - 110 mmol/L Final  . CO2 08/19/2019 25  20 - 32 mmol/L Final  . Calcium 08/19/2019 9.6  8.6 - 10.3 mg/dL Final  . Total Protein 08/19/2019 7.0  6.1 - 8.1 g/dL Final  . Albumin 08/19/2019 4.4  3.6 - 5.1 g/dL Final  . Globulin 08/19/2019 2.6  1.9 -  3.7 g/dL (calc) Final  . AG Ratio 08/19/2019 1.7  1.0 - 2.5 (calc) Final  . Total Bilirubin 08/19/2019 0.7  0.2 - 1.2 mg/dL Final  . Alkaline phosphatase (APISO) 08/19/2019 54  35 - 144 U/L Final  . AST 08/19/2019 30  10 - 35 U/L Final  . ALT 08/19/2019 43  9 - 46 U/L Final  . Cholesterol 08/19/2019 130  <200 mg/dL Final  . HDL 08/19/2019 34* > OR = 40 mg/dL Final  . Triglycerides 08/19/2019 132  <150 mg/dL Final  . LDL Cholesterol (Calc) 08/19/2019 74  mg/dL (calc) Final   Comment: Reference range: <100 . Desirable range <100 mg/dL for primary prevention;   <70 mg/dL for patients with CHD or diabetic patients  with > or = 2 CHD risk factors. Marland Kitchen LDL-C is now calculated using the Martin-Hopkins  calculation, which is a validated novel method providing  better accuracy than the Friedewald equation in the  estimation of LDL-C.  Cresenciano Genre et al. Annamaria Helling. 7026;378(58): 2061-2068    (http://education.[facility].com/faq/FAQ164)   . Total CHOL/HDL Ratio 08/19/2019 3.8  <5.0 (calc) Final  . Non-HDL Cholesterol (Calc) 08/19/2019 96  <130 mg/dL (calc) Final   Comment: For patients with diabetes plus 1 major ASCVD risk  factor, treating to a non-HDL-C goal of <100 mg/dL  (LDL-C of <70 mg/dL) is considered a therapeutic  option.   Marland Kitchen PSA 08/19/2019 1.1  < OR = 4.0 ng/mL Final   Comment: The total PSA value from this assay system is  standardized against the WHO standard. The test  result will be approximately 20% lower when compared  to the equimolar-standardized total PSA (Beckman  Coulter). Comparison of serial PSA results should be  interpreted with this fact in mind. . This test was performed using the Siemens  chemiluminescent method. Values obtained from  different assay methods cannot be used interchangeably. PSA levels, regardless of value, should not be interpreted as absolute evidence of the presence or absence of disease.   . TEST NAME: 08/19/2019 HEMOGLOBIN A1c   Final  . TEST CODE: 08/19/2019 [id number]   Final  . CLIENT CONTACT: 08/19/2019 Learta Codding   Final  . REPORT ALWAYS MESSAGE SIGNATURE 08/19/2019    Final   Comment: . The laboratory testing on this patient was verbally requested or confirmed by the ordering physician or his or her authorized representative after contact with an employee of [employer]. Federal regulations require that we maintain on file written authorization for all laboratory testing.  Accordingly we are asking that the ordering physician or his or her authorized representative sign a copy of this report and promptly return it to the client service representative. . . Signature:____________________________________________________ . Please fax this signed page to [phone] or return it via your [employer] courier.     Past Medical History:  Diagnosis Date  . Allergy   . Diabetes mellitus without  complication ([place])   . Hyperlipidemia   . Hypertension    Past Surgical History:  Procedure Laterality Date  . COLONOSCOPY  2015  . INSERTION OF MESH N/A 10/31/2017   Procedure: INSERTION OF MESH;  Surgeon: Coralie Keens, MD;  Location: [facility];  Service: General;  Laterality: N/A;  . UMBILICAL HERNIA REPAIR N/A 10/31/2017   Procedure: UMBILICAL HERNIA REPAIR WITH MESH;  Surgeon: Coralie Keens, MD;  Location: [facility];  Service: General;  Laterality: N/A;  . VASECTOMY  2006   Current Outpatient Medications on File Prior to Visit  Medication Sig  Dispense Refill  . acetaminophen (TYLENOL) 325 MG tablet Take 2 tablets (650 mg total) by mouth every 6 (six) hours as needed for mild pain or headache (fever >/= 101). 30 tablet 0  . amLODipine (NORVASC) 5 MG tablet Take 1 tablet (5 mg total) by mouth daily. 90 tablet 3  . ascorbic acid (VITAMIN C) 500 MG tablet Take 1 tablet (500 mg total) by mouth daily. 10 tablet 0  . aspirin 81 MG tablet Take 81 mg by mouth daily.    Marland Kitchen atorvastatin (LIPITOR) 40 MG tablet TAKE 1 TABLET BY MOUTH EVERYDAY AT BEDTIME 30 tablet 2  . blood glucose meter kit and supplies KIT Dispense based on patient and insurance preference. Check fasting blood sugar each morning.  E11.65 1 each 0  . Dulaglutide (TRULICITY) 1.5 ZD/6.6YQ SOPN Inject 1.5 mg into the skin once a week. (Patient taking differently: Inject 1.5 mg into the skin every Sunday. ) 12 pen 3  . famotidine (PEPCID AC) 10 MG chewable tablet Chew 10 mg by mouth as needed for heartburn.    . fluticasone (FLONASE) 50 MCG/ACT nasal spray Place 2 sprays into both nostrils daily. 48 g 11  . Insulin Pen Needle (NOVOFINE) 30G X 8 MM MISC Inject 10 each into the skin as needed (Daily while on lantus.). 90 each 0  . Ipratropium-Albuterol (COMBIVENT) 20-100 MCG/ACT AERS respimat Inhale 2 puffs into the lungs every 6 (six) hours as needed for wheezing. 4 g 0  . JARDIANCE 25 MG TABS tablet TAKE 1 TABLET BY MOUTH EVERY DAY 90  tablet 3  . lip balm (CARMEX) ointment Apply 1 application topically as needed for lip care (cold sores). 7 g 0  . lisinopril-hydrochlorothiazide (ZESTORETIC) 20-12.5 MG tablet Take 2 tablets by mouth daily. 180 tablet 3  . loratadine (CLARITIN) 10 MG tablet Take 10 mg by mouth daily as needed for allergies.    . metFORMIN (GLUCOPHAGE) 1000 MG tablet Take 1 tablet (1,000 mg total) by mouth 2 (two) times daily with a meal. 180 tablet 1  . Multiple Vitamins-Minerals (MULTIVITAMIN PO) Take 1 tablet by mouth daily.     Marland Kitchen nystatin (MYCOSTATIN) 100000 UNIT/ML suspension Take 5 mLs (500,000 Units total) by mouth 4 (four) times daily. 60 mL 0  . ONE TOUCH ULTRA TEST test strip CHECK FASTING BLOOD SUGAR EACH MORNING 100 each 3  . OneTouch Delica Lancets 03K MISC USE AS DIRECTED 100 each 3  . triamcinolone (KENALOG) 0.1 % paste Use as directed 1 application in the mouth or throat 2 (two) times daily. 20 g 2  . zinc sulfate 220 (50 Zn) MG capsule Take 1 capsule (220 mg total) by mouth daily. 10 capsule 0  . Insulin Detemir (LEVEMIR FLEXTOUCH) 100 UNIT/ML Pen Inject 15 Units into the skin daily for 3 days, THEN 10 Units daily for 3 days, THEN 5 Units daily for 3 days. 3 mL 0   No current facility-administered medications on file prior to visit.   No Known Allergies Social History   Socioeconomic History  . Marital status: Married    Spouse name: Not on file  . Number of children: Not on file  . Years of education: Not on file  . Highest education level: Not on file  Occupational History  . Not on file  Tobacco Use  . Smoking status: Never Smoker  . Smokeless tobacco: Never Used  Substance and Sexual Activity  . Alcohol use: No  . Drug use: No  . Sexual activity:  Yes    Comment: married to [place].  Three kids.  exercising daily.  Other Topics Concern  . Not on file  Social History Narrative  . Not on file   Social Determinants of Health   Financial Resource Strain:   . Difficulty of  Paying Living Expenses:   Food Insecurity:   . Worried About Charity fundraiser in the Last Year:   . Arboriculturist in the Last Year:   Transportation Needs:   . Film/video editor (Medical):   Marland Kitchen Lack of Transportation (Non-Medical):   Physical Activity:   . Days of Exercise per Week:   . Minutes of Exercise per Session:   Stress:   . Feeling of Stress :   Social Connections:   . Frequency of Communication with Friends and Family:   . Frequency of Social Gatherings with Friends and Family:   . Attends Religious Services:   . Active Member of Clubs or Organizations:   . Attends Archivist Meetings:   Marland Kitchen Marital Status:   Intimate Partner Violence:   . Fear of Current or Ex-Partner:   . Emotionally Abused:   Marland Kitchen Physically Abused:   . Sexually Abused:    Family History  Problem Relation Age of Onset  . Heart disease Father   . Stroke Father   . Hypertension Sister   . Heart disease Mother   . Colon cancer Neg Hx       Review of Systems  Respiratory: Negative for apnea, cough, choking, chest tightness, shortness of breath and wheezing.   Cardiovascular: Negative for chest pain, palpitations and leg swelling.  Gastrointestinal: Negative for abdominal distention, abdominal pain and diarrhea.  Genitourinary: Negative for difficulty urinating, dysuria, frequency and hematuria.  All other systems reviewed and are negative.      Objective:   Physical Exam  Constitutional: He is oriented to person, place, and time. He appears well-developed and well-nourished.  HENT:  Head: Normocephalic and atraumatic.  Right Ear: External ear normal.  Left Ear: External ear normal.  Nose: Nose normal.  Mouth/Throat: Oropharynx is clear and moist. No oropharyngeal exudate.  Eyes: Pupils are equal, round, and reactive to light. Conjunctivae and EOM are normal. Right eye exhibits no discharge. Left eye exhibits no discharge. No scleral icterus.  Neck: No JVD present. No  tracheal deviation present. No thyromegaly present.  Cardiovascular: Normal rate, regular rhythm, normal heart sounds and intact distal pulses. Exam reveals no gallop and no friction rub.  No murmur heard. Pulmonary/Chest: Effort normal and breath sounds normal. No stridor. No respiratory distress. He has no wheezes. He has no rales. He exhibits no tenderness.  Abdominal: Soft. Bowel sounds are normal. He exhibits no distension and no mass. There is no abdominal tenderness. There is no rebound and no guarding.  Musculoskeletal:        General: No tenderness or edema. Normal range of motion.     Cervical back: Normal range of motion and neck supple.  Lymphadenopathy:    He has no cervical adenopathy.  Neurological: He is alert and oriented to person, place, and time. He has normal reflexes. No cranial nerve deficit. He exhibits normal muscle tone. Coordination normal.  Skin: Skin is warm. No rash noted. He is not diaphoretic. No erythema. No pallor.  Psychiatric: He has a normal mood and affect. His behavior is normal. Judgment and thought content normal.  Vitals reviewed.         Assessment & Plan:  Routine general medical examination at a health care facility  Essential hypertension  Hyperlipidemia, unspecified hyperlipidemia type  Type 2 diabetes mellitus with hyperosmolarity without coma, without long-term current use of insulin (HCC)  Pneumonia due to COVID-19 virus - Plan: SARS-CoV-2 Semi-Quantitative Total Antibody, Spike  Patient wants to check quantitative levels of his antibodies.  I recommended that he receive the Covid vaccination regardless however the patient would like to proceed with quantitative levels of his IgG prior to making that decision.  I will check his hemoglobin A1c.  If greater than 7 we will need to make additional changes in his medication however I did encourage 30 minutes a day 5 days a week of aerobic exercise to try to help lower his A1c and improve his  weight.  His blood pressure today is acceptable.  His cholesterol is excellent except for his low HDL which I recommended exercise for.  Colonoscopy and prostate cancer screening are up-to-date.  Regular anticipatory guidance is provided.  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  He has an appointment next week to see an oral surgeon to have these excised.  I recommended this given the chance for potential cancer in the future especially given the rapid growth over the last few months.

## 2019-08-22 LAB — CBC WITH DIFFERENTIAL/PLATELET
Absolute Monocytes: 740 cells/uL (ref 200–950)
Basophils Absolute: 96 cells/uL (ref 0–200)
Basophils Relative: 1.1 %
Eosinophils Absolute: 409 cells/uL (ref 15–500)
Eosinophils Relative: 4.7 %
HCT: 53.4 % — ABNORMAL HIGH (ref 38.5–50.0)
Hemoglobin: 17.7 g/dL — ABNORMAL HIGH (ref 13.2–17.1)
Lymphs Abs: 2975 cells/uL (ref 850–3900)
MCH: 28.8 pg (ref 27.0–33.0)
MCHC: 33.1 g/dL (ref 32.0–36.0)
MCV: 86.8 fL (ref 80.0–100.0)
MPV: 11.2 fL (ref 7.5–12.5)
Monocytes Relative: 8.5 %
Neutro Abs: 4481 cells/uL (ref 1500–7800)
Neutrophils Relative %: 51.5 %
Platelets: 273 10*3/uL (ref 140–400)
RBC: 6.15 10*6/uL — ABNORMAL HIGH (ref 4.20–5.80)
RDW: 15.4 % — ABNORMAL HIGH (ref 11.0–15.0)
Total Lymphocyte: 34.2 %
WBC: 8.7 10*3/uL (ref 3.8–10.8)

## 2019-08-22 LAB — PSA: PSA: 1.1 ng/mL (ref <=4.0)

## 2019-08-22 LAB — COMPREHENSIVE METABOLIC PANEL
AG Ratio: 1.7 (calc) (ref 1.0–2.5)
ALT: 43 U/L (ref 9–46)
AST: 30 U/L (ref 10–35)
Albumin: 4.4 g/dL (ref 3.6–5.1)
Alkaline phosphatase (APISO): 54 U/L (ref 35–144)
BUN: 20 mg/dL (ref 7–25)
CO2: 25 mmol/L (ref 20–32)
Calcium: 9.6 mg/dL (ref 8.6–10.3)
Chloride: 102 mmol/L (ref 98–110)
Creat: 0.88 mg/dL (ref 0.70–1.33)
Globulin: 2.6 g/dL (calc) (ref 1.9–3.7)
Glucose, Bld: 116 mg/dL — ABNORMAL HIGH (ref 65–99)
Potassium: 3.5 mmol/L (ref 3.5–5.3)
Sodium: 141 mmol/L (ref 135–146)
Total Bilirubin: 0.7 mg/dL (ref 0.2–1.2)
Total Protein: 7 g/dL (ref 6.1–8.1)

## 2019-08-22 LAB — TEST AUTHORIZATION

## 2019-08-22 LAB — TEST AUTHORIZATION 2

## 2019-08-22 LAB — LIPID PANEL
Cholesterol: 130 mg/dL (ref <200)
HDL: 34 mg/dL — ABNORMAL LOW (ref >=40)
LDL Cholesterol (Calc): 74 mg/dL (calc)
Non-HDL Cholesterol (Calc): 96 mg/dL (calc) (ref <130)
Total CHOL/HDL Ratio: 3.8 (calc) (ref <5.0)
Triglycerides: 132 mg/dL (ref <150)

## 2019-08-22 LAB — HEMOGLOBIN A1C W/OUT EAG: Hgb A1c MFr Bld: 7.3 % of total Hgb — ABNORMAL HIGH (ref <5.7)

## 2019-08-22 LAB — SARS-COV-2 ANTIBODY(IGG)SPIKE,SEMI-QUANTITATIVE: SARS COV1 AB(IGG)SPIKE,SEMI QN: >20 index — ABNORMAL HIGH (ref <1.00)

## 2019-08-26 ENCOUNTER — Ambulatory Visit (INDEPENDENT_AMBULATORY_CARE_PROVIDER_SITE_OTHER): Payer: 59 | Admitting: Otolaryngology

## 2019-08-26 ENCOUNTER — Other Ambulatory Visit: Payer: Self-pay | Admitting: Family Medicine

## 2019-08-26 ENCOUNTER — Encounter (INDEPENDENT_AMBULATORY_CARE_PROVIDER_SITE_OTHER): Payer: Self-pay | Admitting: Otolaryngology

## 2019-08-26 ENCOUNTER — Other Ambulatory Visit (HOSPITAL_COMMUNITY)
Admission: RE | Admit: 2019-08-26 | Discharge: 2019-08-26 | Disposition: A | Discharge status: Home / Self Care | Payer: 59 | Source: Ambulatory Visit | Attending: Otolaryngology | Admitting: Otolaryngology

## 2019-08-26 ENCOUNTER — Other Ambulatory Visit: Payer: Self-pay

## 2019-08-26 VITALS — Temp 97.5°F

## 2019-08-26 DIAGNOSIS — D3701 Neoplasm of uncertain behavior of lip: Secondary | ICD-10-CM | POA: Diagnosis not present

## 2019-08-26 DIAGNOSIS — D3709 Neoplasm of uncertain behavior of other specified sites of the oral cavity: Secondary | ICD-10-CM

## 2019-08-26 DIAGNOSIS — D3705 Neoplasm of uncertain behavior of pharynx: Secondary | ICD-10-CM | POA: Insufficient documentation

## 2019-08-26 MED ORDER — AZELASTINE HCL 0.1 % NA SOLN: 2.0000 | Freq: Two times a day (BID) | NASAL | 12 refills | Status: DC

## 2019-08-26 NOTE — Progress Notes (Signed)
HPI: Joseph [name] is a 59 y.o. male who presents is referred by Lennie Odor, PA for evaluation of mouth lesions.  Patient states that he developed Covid in January he also developed some lesions in his mouth about the same time.  The nodules have gradually gotten larger and he occasionally bites them.  He has tried using triamcinolone on the lesions but they have been persistent..  Past Medical History:  Diagnosis Date  . Allergy   . Diabetes mellitus without complication ([place])   . Hyperlipidemia   . Hypertension    Past Surgical History:  Procedure Laterality Date  . COLONOSCOPY  2015  . INSERTION OF MESH N/A 10/31/2017   Procedure: INSERTION OF MESH;  Surgeon: Coralie Keens, MD;  Location: [facility];  Service: General;  Laterality: N/A;  . UMBILICAL HERNIA REPAIR N/A 10/31/2017   Procedure: UMBILICAL HERNIA REPAIR WITH MESH;  Surgeon: Coralie Keens, MD;  Location: [facility];  Service: General;  Laterality: N/A;  . VASECTOMY  2006   Social History   Socioeconomic History  . Marital status: Married    Spouse name: Not on file  . Number of children: Not on file  . Years of education: Not on file  . Highest education level: Not on file  Occupational History  . Not on file  Tobacco Use  . Smoking status: Never Smoker  . Smokeless tobacco: Never Used  Substance and Sexual Activity  . Alcohol use: No  . Drug use: No  . Sexual activity: Yes    Comment: married to [place].  Three kids.  exercising daily.  Other Topics Concern  . Not on file  Social History Narrative  . Not on file   Social Determinants of Health   Financial Resource Strain:   . Difficulty of Paying Living Expenses:   Food Insecurity:   . Worried About Charity fundraiser in the Last Year:   . Arboriculturist in the Last Year:   Transportation Needs:   . Film/video editor (Medical):   Marland Kitchen Lack of Transportation (Non-Medical):   Physical Activity:   . Days of Exercise per Week:   . Minutes of Exercise  per Session:   Stress:   . Feeling of Stress :   Social Connections:   . Frequency of Communication with Friends and Family:   . Frequency of Social Gatherings with Friends and Family:   . Attends Religious Services:   . Active Member of Clubs or Organizations:   . Attends Archivist Meetings:   Marland Kitchen Marital Status:    Family History  Problem Relation Age of Onset  . Heart disease Father   . Stroke Father   . Hypertension Sister   . Heart disease Mother   . Colon cancer Neg Hx    No Known Allergies Prior to Admission medications   Medication Sig Start Date End Date Taking? Authorizing Provider  acetaminophen (TYLENOL) 325 MG tablet Take 2 tablets (650 mg total) by mouth every 6 (six) hours as needed for mild pain or headache (fever >/= 101). 06/02/19  Yes Swayze, Ava, DO  amLODipine (NORVASC) 5 MG tablet Take 1 tablet (5 mg total) by mouth daily. 09/02/18  Yes Susy Frizzle, MD  ascorbic acid (VITAMIN C) 500 MG tablet Take 1 tablet (500 mg total) by mouth daily. 06/03/19  Yes Swayze, Ava, DO  aspirin 81 MG tablet Take 81 mg by mouth daily.   Yes [provider]  atorvastatin (LIPITOR) 40  MG tablet TAKE 1 TABLET BY MOUTH EVERYDAY AT BEDTIME 08/04/19  Yes Susy Frizzle, MD  azelastine (ASTELIN) 0.1 % nasal spray Place 2 sprays into both nostrils 2 (two) times daily. Use in each nostril as directed 08/26/19  Yes Pickard, Cammie Mcgee, MD  blood glucose meter kit and supplies KIT Dispense based on patient and insurance preference. Check fasting blood sugar each morning.  E11.65 08/19/15  Yes Dixon, Stanton Kidney B, PA-C  Dulaglutide (TRULICITY) 1.5 KZ/6.0FU SOPN Inject 1.5 mg into the skin once a week. Patient taking differently: Inject 1.5 mg into the skin every Sunday.  08/15/18  Yes Susy Frizzle, MD  famotidine (PEPCID AC) 10 MG chewable tablet Chew 10 mg by mouth as needed for heartburn.   Yes [provider]  fluticasone (FLONASE) 50 MCG/ACT nasal spray Place 2 sprays  into both nostrils daily. 05/08/14  Yes Susy Frizzle, MD  Insulin Pen Needle (NOVOFINE) 30G X 8 MM MISC Inject 10 each into the skin as needed (Daily while on lantus.). 06/02/19  Yes Swayze, Ava, DO  Ipratropium-Albuterol (COMBIVENT) 20-100 MCG/ACT AERS respimat Inhale 2 puffs into the lungs every 6 (six) hours as needed for wheezing. 06/02/19  Yes Swayze, Ava, DO  JARDIANCE 25 MG TABS tablet TAKE 1 TABLET BY MOUTH EVERY DAY 06/20/19  Yes Susy Frizzle, MD  lisinopril-hydrochlorothiazide (ZESTORETIC) 20-12.5 MG tablet Take 2 tablets by mouth daily. 11/22/18  Yes Susy Frizzle, MD  loratadine (CLARITIN) 10 MG tablet Take 10 mg by mouth daily as needed for allergies.   Yes [provider]  metFORMIN (GLUCOPHAGE) 1000 MG tablet Take 1 tablet (1,000 mg total) by mouth 2 (two) times daily with a meal. 07/07/19  Yes Susy Frizzle, MD  Multiple Vitamins-Minerals (MULTIVITAMIN PO) Take 1 tablet by mouth daily.    Yes [provider]  nystatin (MYCOSTATIN) 100000 UNIT/ML suspension Take 5 mLs (500,000 Units total) by mouth 4 (four) times daily. 06/10/19  Yes Susy Frizzle, MD  ONE TOUCH ULTRA TEST test strip CHECK FASTING BLOOD SUGAR EACH MORNING 02/23/16  Yes Dena Billet B, PA-C  OneTouch Delica Lancets 93A MISC USE AS DIRECTED 07/14/19  Yes Susy Frizzle, MD  triamcinolone (KENALOG) 0.1 % paste Use as directed 1 application in the mouth or throat 2 (two) times daily. 06/25/19  Yes Susy Frizzle, MD  zinc sulfate 220 (50 Zn) MG capsule Take 1 capsule (220 mg total) by mouth daily. 06/03/19  Yes Swayze, Ava, DO     Positive ROS: Otherwise negative  All other systems have been reviewed and were otherwise negative with the exception of those mentioned in the HPI and as above.  Physical Exam: Constitutional: Alert, well-appearing, no acute distress Ears: External ears without lesions or tenderness. Ear canals are clear bilaterally with intact, clear TMs.  Nasal: External  nose without lesions. Septum midline. Clear nasal passages Oral: Patient has 2 large verrucous appearing nodules on the left lower lip that extrude 6 to 8 mm and are approximately 1 cm in size with a broad base of the mucosal membrane on the lower lip.  He also has 2 smaller nodules on the upper lip on the mucosal surface.  These measure 2 to 3 mm in size and do not protrude as much. Procedure: The lower lip was injected with 1 and half cc of Xylocaine with epinephrine.  The 2 pedunculated verrucous type lesions were excised along with underlying mucosa.  They were sent to pathology.  Hemostasis was obtained with silver nitrate.  Patient tolerated this well. Neck: No palpable adenopathy or masses Respiratory: Breathing comfortably  Skin: No facial/neck lesions or rash noted.  Procedures  Assessment: Verrucoid lesions of the lower lip x2  Plan: These were excised in the office today and cauterized with silver nitrate. He may follow-up at a later date if the upper lip lesions enlarge or bother him.   Radene Journey, MD   CC:

## 2019-08-27 LAB — SURGICAL PATHOLOGY

## 2019-10-03 ENCOUNTER — Other Ambulatory Visit: Payer: Self-pay | Admitting: Family Medicine

## 2019-10-29 ENCOUNTER — Other Ambulatory Visit: Payer: Self-pay | Admitting: Family Medicine

## 2019-11-04 ENCOUNTER — Other Ambulatory Visit: Payer: Self-pay | Admitting: Family Medicine

## 2019-11-04 DIAGNOSIS — E1165 Type 2 diabetes mellitus with hyperglycemia: Secondary | ICD-10-CM

## 2019-11-04 MED ORDER — TRULICITY 1.5 MG/0.5ML ~~LOC~~ SOAJ: 1.5000 mg | SUBCUTANEOUS | 3 refills | Status: DC

## 2019-12-16 ENCOUNTER — Other Ambulatory Visit: Payer: No Typology Code available for payment source

## 2019-12-16 ENCOUNTER — Other Ambulatory Visit: Payer: Self-pay

## 2019-12-16 DIAGNOSIS — E1100 Type 2 diabetes mellitus with hyperosmolarity without nonketotic hyperglycemic-hyperosmolar coma (NKHHC): Secondary | ICD-10-CM

## 2019-12-17 ENCOUNTER — Other Ambulatory Visit: Payer: Self-pay

## 2019-12-17 LAB — HEMOGLOBIN A1C
Hgb A1c MFr Bld: 8.7 % of total Hgb — ABNORMAL HIGH (ref <5.7)
Mean Plasma Glucose: 203 (calc)
eAG (mmol/L): 11.2 (calc)

## 2019-12-17 LAB — COMPLETE METABOLIC PANEL WITH GFR
AG Ratio: 2.1 (calc) (ref 1.0–2.5)
ALT: 59 U/L — ABNORMAL HIGH (ref 9–46)
AST: 33 U/L (ref 10–35)
Albumin: 4.5 g/dL (ref 3.6–5.1)
Alkaline phosphatase (APISO): 57 U/L (ref 35–144)
BUN: 25 mg/dL (ref 7–25)
CO2: 26 mmol/L (ref 20–32)
Calcium: 9.6 mg/dL (ref 8.6–10.3)
Chloride: 101 mmol/L (ref 98–110)
Creat: 1.1 mg/dL (ref 0.70–1.33)
GFR, Est African American: 86 mL/min/{1.73_m2} (ref >=60)
GFR, Est Non African American: 74 mL/min/{1.73_m2} (ref >=60)
Globulin: 2.1 g/dL (calc) (ref 1.9–3.7)
Glucose, Bld: 171 mg/dL — ABNORMAL HIGH (ref 65–99)
Potassium: 3.4 mmol/L — ABNORMAL LOW (ref 3.5–5.3)
Sodium: 139 mmol/L (ref 135–146)
Total Bilirubin: 0.7 mg/dL (ref 0.2–1.2)
Total Protein: 6.6 g/dL (ref 6.1–8.1)

## 2019-12-19 ENCOUNTER — Other Ambulatory Visit: Payer: Self-pay

## 2019-12-19 ENCOUNTER — Ambulatory Visit (INDEPENDENT_AMBULATORY_CARE_PROVIDER_SITE_OTHER): Payer: No Typology Code available for payment source | Admitting: Family Medicine

## 2019-12-19 ENCOUNTER — Encounter: Payer: Self-pay | Admitting: Family Medicine

## 2019-12-19 VITALS — BP 160/92 | HR 82 | Temp 97.8°F | Resp 14 | Ht 69.0 in | Wt 223.0 lb

## 2019-12-19 DIAGNOSIS — E785 Hyperlipidemia, unspecified: Secondary | ICD-10-CM

## 2019-12-19 DIAGNOSIS — E1165 Type 2 diabetes mellitus with hyperglycemia: Secondary | ICD-10-CM | POA: Diagnosis not present

## 2019-12-19 DIAGNOSIS — I10 Essential (primary) hypertension: Secondary | ICD-10-CM | POA: Diagnosis not present

## 2019-12-19 DIAGNOSIS — N5089 Other specified disorders of the male genital organs: Secondary | ICD-10-CM

## 2019-12-19 MED ORDER — PIOGLITAZONE HCL 30 MG PO TABS
30.0000 mg | ORAL_TABLET | Freq: Every day | ORAL | 3 refills | Status: DC
Start: 2019-12-19 — End: 2021-05-12

## 2019-12-19 NOTE — Progress Notes (Signed)
Subjective:    Patient ID: Joseph [name], male    DOB: [birth year], 59 y.o.   MRN: [id number]  HPI  Patient is a very pleasant 59 year old Caucasian male who is here today for a follow-up.  Since I last saw him, his diabetes test has worsened.  It has increased from 7.3-8.7.  This is despite using Metformin, Trulicity, Jardiance.  He is not getting regular exercise.  He admits that his diet could be better.  He denies any polyuria, polydipsia, or blurry vision.  He has not yet received his Covid vaccination however he did have Covid earlier this year.  He denies any chest pain shortness of breath or dyspnea on exertion.  He does complain of a lump on his left testicle that has been tender and enlarging.  It comes and goes.  On examination today it appears that the mass is actually growing off his epididymis.  I suspect either a varicocele or possibly a spermatocele.  He denies any hematuria or dysuria.  Blood pressure is elevated today at 160/92 however the patient states is typically 135/85 at home.  He states that he is very nervous because he was concerned that the mass may be testicular cancer.  It has been there for 4 months and he was very worried about it  Past Medical History:  Diagnosis Date  . Allergy   . Diabetes mellitus without complication ([place])   . Hyperlipidemia   . Hypertension    Past Surgical History:  Procedure Laterality Date  . COLONOSCOPY  2015  . INSERTION OF MESH N/A 10/31/2017   Procedure: INSERTION OF MESH;  Surgeon: Coralie Keens, MD;  Location: [facility];  Service: General;  Laterality: N/A;  . UMBILICAL HERNIA REPAIR N/A 10/31/2017   Procedure: UMBILICAL HERNIA REPAIR WITH MESH;  Surgeon: Coralie Keens, MD;  Location: [facility];  Service: General;  Laterality: N/A;  . VASECTOMY  2006   Current Outpatient Medications on File Prior to Visit  Medication Sig Dispense Refill  . acetaminophen (TYLENOL) 325 MG tablet Take 2 tablets (650 mg total) by mouth every 6 (six)  hours as needed for mild pain or headache (fever >/= 101). 30 tablet 0  . amLODipine (NORVASC) 5 MG tablet TAKE 1 TABLET BY MOUTH EVERY DAY 90 tablet 3  . ascorbic acid (VITAMIN C) 500 MG tablet Take 1 tablet (500 mg total) by mouth daily. 10 tablet 0  . aspirin 81 MG tablet Take 81 mg by mouth daily.    Marland Kitchen atorvastatin (LIPITOR) 40 MG tablet TAKE 1 TABLET BY MOUTH EVERYDAY AT BEDTIME 90 tablet 0  . azelastine (ASTELIN) 0.1 % nasal spray Place 2 sprays into both nostrils 2 (two) times daily. Use in each nostril as directed 30 mL 12  . blood glucose meter kit and supplies KIT Dispense based on patient and insurance preference. Check fasting blood sugar each morning.  E11.65 1 each 0  . Dulaglutide (TRULICITY) 1.5 VQ/0.0QQ SOPN Inject 0.5 mLs (1.5 mg total) into the skin every Sunday. 3 mL 3  . famotidine (PEPCID AC) 10 MG chewable tablet Chew 10 mg by mouth as needed for heartburn.    . fluticasone (FLONASE) 50 MCG/ACT nasal spray Place 2 sprays into both nostrils daily. 48 g 11  . Insulin Pen Needle (NOVOFINE) 30G X 8 MM MISC Inject 10 each into the skin as needed (Daily while on lantus.). 90 each 0  . Ipratropium-Albuterol (COMBIVENT) 20-100 MCG/ACT AERS respimat Inhale 2 puffs into the lungs every  6 (six) hours as needed for wheezing. 4 g 0  . JARDIANCE 25 MG TABS tablet TAKE 1 TABLET BY MOUTH EVERY DAY 90 tablet 3  . lisinopril-hydrochlorothiazide (ZESTORETIC) 20-12.5 MG tablet TAKE 2 TABLETS BY MOUTH EVERY DAY 180 tablet 2  . loratadine (CLARITIN) 10 MG tablet Take 10 mg by mouth daily as needed for allergies.    . metFORMIN (GLUCOPHAGE) 1000 MG tablet TAKE 1 TABLET BY MOUTH TWICE A DAY WITH A MEAL 180 tablet 1  . Multiple Vitamins-Minerals (MULTIVITAMIN PO) Take 1 tablet by mouth daily.     Marland Kitchen nystatin (MYCOSTATIN) 100000 UNIT/ML suspension Take 5 mLs (500,000 Units total) by mouth 4 (four) times daily. 60 mL 0  . ONE TOUCH ULTRA TEST test strip CHECK FASTING BLOOD SUGAR EACH MORNING 100 each 3   . OneTouch Delica Lancets 25E MISC USE AS DIRECTED 100 each 3  . triamcinolone (KENALOG) 0.1 % paste Use as directed 1 application in the mouth or throat 2 (two) times daily. 20 g 2  . zinc sulfate 220 (50 Zn) MG capsule Take 1 capsule (220 mg total) by mouth daily. 10 capsule 0   No current facility-administered medications on file prior to visit.    No Known Allergies Social History   Socioeconomic History  . Marital status: Married    Spouse name: Not on file  . Number of children: Not on file  . Years of education: Not on file  . Highest education level: Not on file  Occupational History  . Not on file  Tobacco Use  . Smoking status: Never Smoker  . Smokeless tobacco: Never Used  Vaping Use  . Vaping Use: Never used  Substance and Sexual Activity  . Alcohol use: No  . Drug use: No  . Sexual activity: Yes    Comment: married to [place].  Three kids.  exercising daily.  Other Topics Concern  . Not on file  Social History Narrative  . Not on file   Social Determinants of Health   Financial Resource Strain:   . Difficulty of Paying Living Expenses: Not on file  Food Insecurity:   . Worried About Charity fundraiser in the Last Year: Not on file  . Ran Out of Food in the Last Year: Not on file  Transportation Needs:   . Lack of Transportation (Medical): Not on file  . Lack of Transportation (Non-Medical): Not on file  Physical Activity:   . Days of Exercise per Week: Not on file  . Minutes of Exercise per Session: Not on file  Stress:   . Feeling of Stress : Not on file  Social Connections:   . Frequency of Communication with Friends and Family: Not on file  . Frequency of Social Gatherings with Friends and Family: Not on file  . Attends Religious Services: Not on file  . Active Member of Clubs or Organizations: Not on file  . Attends Archivist Meetings: Not on file  . Marital Status: Not on file  Intimate Partner Violence:   . Fear of Current or  Ex-Partner: Not on file  . Emotionally Abused: Not on file  . Physically Abused: Not on file  . Sexually Abused: Not on file     Review of Systems  All other systems reviewed and are negative.      Objective:   Physical Exam Vitals reviewed.  Constitutional:      Appearance: He is well-developed.  Cardiovascular:     Rate and  Rhythm: Normal rate and regular rhythm.     Heart sounds: Normal heart sounds. No murmur heard.  No friction rub. No gallop.   Pulmonary:     Effort: Pulmonary effort is normal. No respiratory distress.     Breath sounds: Normal breath sounds. No stridor. No wheezing or rales.  Chest:     Chest wall: No tenderness.  Abdominal:     General: Bowel sounds are normal. There is no distension.     Palpations: Abdomen is soft. There is no mass.     Tenderness: There is no abdominal tenderness. There is no guarding or rebound.  Genitourinary:    Penis: Normal.      Testes:        Right: Mass or tenderness not present.        Left: Varicocele present. Mass or tenderness not present.     Epididymis:     Left: Mass present.  Neurological:     Mental Status: He is alert and oriented to person, place, and time.     Cranial Nerves: No cranial nerve deficit.     Coordination: Coordination normal.     Deep Tendon Reflexes: Reflexes normal.    Lab on 12/16/2019  Component Date Value Ref Range Status  . Hgb A1c MFr Bld 12/16/2019 8.7* <5.7 % of total Hgb Final   Comment: For someone without known diabetes, a hemoglobin A1c value of 6.5% or greater indicates that they may have  diabetes and this should be confirmed with a follow-up  test. . For someone with known diabetes, a value <7% indicates  that their diabetes is well controlled and a value  greater than or equal to 7% indicates suboptimal  control. A1c targets should be individualized based on  duration of diabetes, age, comorbid conditions, and  other considerations. . Currently, no consensus  exists regarding use of hemoglobin A1c for diagnosis of diabetes for children. .   . Mean Plasma Glucose 12/16/2019 203  (calc) Final  . eAG (mmol/L) 12/16/2019 11.2  (calc) Final  . Glucose, Bld 12/16/2019 171* 65 - 99 mg/dL Final   Comment: .            Fasting reference interval . For someone without known diabetes, a glucose value >125 mg/dL indicates that they may have diabetes and this should be confirmed with a follow-up test. .   . BUN 12/16/2019 25  7 - 25 mg/dL Final  . Creat 12/16/2019 1.10  0.70 - 1.33 mg/dL Final   Comment: For patients >69 years of age, the reference limit for Creatinine is approximately 13% higher for people identified as African-American. .   . GFR, Est Non African American 12/16/2019 74  > OR = 60 mL/min/1.53m Final  . GFR, Est African American 12/16/2019 86  > OR = 60 mL/min/1.779mFinal  . BUN/Creatinine Ratio 0886/76/7209OT APPLICABLE  6 - 22 (calc) Final  . Sodium 12/16/2019 139  135 - 146 mmol/L Final  . Potassium 12/16/2019 3.4* 3.5 - 5.3 mmol/L Final  . Chloride 12/16/2019 101  98 - 110 mmol/L Final  . CO2 12/16/2019 26  20 - 32 mmol/L Final  . Calcium 12/16/2019 9.6  8.6 - 10.3 mg/dL Final  . Total Protein 12/16/2019 6.6  6.1 - 8.1 g/dL Final  . Albumin 12/16/2019 4.5  3.6 - 5.1 g/dL Final  . Globulin 12/16/2019 2.1  1.9 - 3.7 g/dL (calc) Final  . AG Ratio 12/16/2019 2.1  1.0 - 2.5 (calc) Final  .  Total Bilirubin 12/16/2019 0.7  0.2 - 1.2 mg/dL Final  . Alkaline phosphatase (APISO) 12/16/2019 57  35 - 144 U/L Final  . AST 12/16/2019 33  10 - 35 U/L Final  . ALT 12/16/2019 59* 9 - 46 U/L Final           Assessment & Plan:  Uncontrolled type 2 diabetes mellitus with hyperglycemia, without long-term current use of insulin ([facility]) - Plan: Amb ref to Medical Nutrition Therapy-MNT  Essential hypertension  Hyperlipidemia, unspecified hyperlipidemia type  Scrotal mass - Plan: US Scrotum  Options to manage his blood sugar include  switching to insulin or adding Actos and making drastic changes in his diet and exercise pattern.  Patient would like to try this first.  I recommended 20 pounds of weight loss and meeting with a nutritionist.  We will also start Actos 30 mg a day and recheck blood sugar and A1c in 3 months.  Blood pressure is extremely high today however he is very nervous.  He will check his blood pressure at home and call me with values later next week.  Usually is well controlled.  Cholesterol is acceptable.  The mass on his left testicle appears to be a varicocele versus a spermatocele.  I will obtain an ultrasound to evaluate further however I do not believe his testicular cancer

## 2019-12-23 ENCOUNTER — Other Ambulatory Visit: Payer: Self-pay | Admitting: Family Medicine

## 2019-12-23 DIAGNOSIS — N5089 Other specified disorders of the male genital organs: Secondary | ICD-10-CM

## 2019-12-25 ENCOUNTER — Encounter: Payer: No Typology Code available for payment source | Attending: Family Medicine | Admitting: Dietician

## 2019-12-25 ENCOUNTER — Encounter: Payer: Self-pay | Admitting: Dietician

## 2019-12-25 ENCOUNTER — Other Ambulatory Visit: Payer: Self-pay

## 2019-12-25 DIAGNOSIS — E119 Type 2 diabetes mellitus without complications: Secondary | ICD-10-CM

## 2019-12-25 NOTE — Progress Notes (Signed)
Diabetes Self-Management Education  Visit Type: First/Initial  Appt. Start Time: 1020 Appt. End Time: 4235  12/25/2019  [name], identified by name and date of birth, is a 59 y.o. male with a diagnosis of Diabetes: Type 2.   ASSESSMENT  Pt was admitted in Jan 2021 for [place], in hospital for 8 days with double pnuemonia. Pt A1c went from low 7s to 8.7 due to hospitalization. Pt was able to keep his A1c around 7 before COVID.  Pt wants to learn new diet and lifestyle change to avoid going on insulin.  Pt reports having his kids every two weeks and that his dietary choices are not as good when they are there. Pt also visits his mother and states that she cooks a lot of [place] food for him when they are together. Pt will return to work within the next couple of months, and wants to avoid a lot of travel with his new position. Pt checks blood sugar fasting, preprandial, and postprandial randomly. Does not check any of them consistently. Pt reports having a bowl of cereal for a late night snack, with 1% milk.  Height 5\' 8"  (1.727 m), weight 221 lb 4.8 oz (100.4 kg). Body mass index is 33.65 kg/m.   Diabetes Self-Management Education - 12/25/19 1037      Visit Information   Visit Type First/Initial      Initial Visit   Diabetes Type Type 2    Are you currently following a meal plan? No    Are you taking your medications as prescribed? Yes    Date Diagnosed 3 years ago      Health Coping   How would you rate your overall health? Good      Psychosocial Assessment   Patient Belief/Attitude about Diabetes Motivated to manage diabetes    Self-care barriers None    Self-management support Family    Other persons present Patient    Patient Concerns Nutrition/Meal planning;Medication    Special Needs None    Preferred Learning Style No preference indicated    Learning Readiness Ready    How often do you need to have someone help you when you read instructions, pamphlets,  or other written materials from your doctor or pharmacy? 1 - Never    What is the last grade level you completed in school? Bachelors Degree      Pre-Education Assessment   Patient understands the diabetes disease and treatment process. Needs Review    Patient understands incorporating nutritional management into lifestyle. Needs Instruction    Patient undertands incorporating physical activity into lifestyle. Needs Instruction    Patient understands using medications safely. Needs Review    Patient understands monitoring blood glucose, interpreting and using results Needs Instruction    Patient understands prevention, detection, and treatment of acute complications. Needs Instruction    Patient understands prevention, detection, and treatment of chronic complications. Needs Instruction    Patient understands how to develop strategies to address psychosocial issues. Needs Instruction    Patient understands how to develop strategies to promote health/change behavior. Needs Instruction      Complications   Last HgB A1C per patient/outside source 8.7 %   12/16/2019   How often do you check your blood sugar? 1-2 times/day    Fasting Blood glucose range (mg/dL) 70-129    Postprandial Blood glucose range (mg/dL) 130-179    Number of hypoglycemic episodes per month 0    Number of hyperglycemic episodes per week 1  Have you had a dilated eye exam in the past 12 months? Yes    Have you had a dental exam in the past 12 months? Yes    Are you checking your feet? Yes    How many days per week are you checking your feet? 1      Dietary Intake   Breakfast Sausage and egg biscuit, coffee    Snack (morning) none    Lunch BBQ sandwich, cole slaw, water    Snack (afternoon) lance cheese cracker    Dinner Whopper with cheese and onion rings, frozen coke    Snack (evening) none    Beverage(s) water, frozen coke,      Exercise   Exercise Type ADL's;Moderate (swimming / aerobic walking)    How many  days per week to you exercise? 5    How many minutes per day do you exercise? 40    Total minutes per week of exercise 200      Patient Education   Previous Diabetes Education No    Disease state  Definition of diabetes, type 1 and 2, and the diagnosis of diabetes;Factors that contribute to the development of diabetes    Nutrition management  Food label reading, portion sizes and measuring food.;Role of diet in the treatment of diabetes and the relationship between the three main macronutrients and blood glucose level;Carbohydrate counting;Reviewed blood glucose goals for pre and post meals and how to evaluate the patients' food intake on their blood glucose level.    Physical activity and exercise  Role of exercise on diabetes management, blood pressure control and cardiac health.;Helped patient identify appropriate exercises in relation to his/her diabetes, diabetes complications and other health issue.    Medications Reviewed patients medication for diabetes, action, purpose, timing of dose and side effects.    Monitoring Purpose and frequency of SMBG.;Taught/discussed recording of test results and interpretation of SMBG.    Acute complications Taught treatment of hypoglycemia - the 15 rule.    Chronic complications Lipid levels, blood glucose control and heart disease;Assessed and discussed foot care and prevention of foot problems    Psychosocial adjustment Role of stress on diabetes;Travel strategies;Worked with patient to identify barriers to care and solutions    Personal strategies to promote health Lifestyle issues that need to be addressed for better diabetes care      Individualized Goals (developed by patient)   Nutrition Follow meal plan discussed;General guidelines for healthy choices and portions discussed    Physical Activity Exercise 5-7 days per week    Medications take my medication as prescribed    Monitoring  test my blood glucose as discussed    Reducing Risk examine blood  glucose patterns;treat hypoglycemia with 15 grams of carbs if blood glucose less than 70mg /dL      Post-Education Assessment   Patient understands the diabetes disease and treatment process. Needs Review    Patient understands incorporating nutritional management into lifestyle. Needs Review    Patient undertands incorporating physical activity into lifestyle. Needs Review    Patient understands using medications safely. Needs Review    Patient understands monitoring blood glucose, interpreting and using results Needs Review    Patient understands prevention, detection, and treatment of acute complications. Needs Review    Patient understands prevention, detection, and treatment of chronic complications. Needs Review    Patient understands how to develop strategies to address psychosocial issues. Needs Review    Patient understands how to develop strategies to promote health/change behavior. Needs  Review      Outcomes   Expected Outcomes Demonstrated interest in learning. Expect positive outcomes    Future DMSE 4-6 wks    Program Status Not Completed           Individualized Plan for Diabetes Self-Management Training:   Learning Objective:  Patient will have a greater understanding of diabetes self-management. Patient education plan is to attend individual and/or group sessions per assessed needs and concerns.   Plan:   Patient Instructions  Resistance exercise 2 times a week. Keep up the 150 minutes per week of physical activity!  On Nutrition Labels, read the serving size first and then total carbohydrates.  Have a balanced snack of carbs and protein.  Check your blood sugar every day. Once fasting, and once 2 hours after a meal.  Aim for 3 carb choices (45g) at each meal, and 1 choice (15G) per snack.  Aim to hit the proportions on the Diabetic MyPlate handout  Review hypoglycemia in the ADA book!    Expected Outcomes:  Demonstrated interest in learning. Expect  positive outcomes  Education material provided: ADA - How to Thrive: A Guide for Your Journey with Diabetes, Meal plan card and My Plate  If problems or questions, patient to contact team via:  Phone and Email  Future DSME appointment: 4-6 wks

## 2019-12-25 NOTE — Patient Instructions (Signed)
Resistance exercise 2 times a week. Keep up the 150 minutes per week of physical activity!  On Nutrition Labels, read the serving size first and then total carbohydrates.  Have a balanced snack of carbs and protein.  Check your blood sugar every day. Once fasting, and once 2 hours after a meal.  Aim for 3 carb choices (45g) at each meal, and 1 choice (15G) per snack.  Aim to hit the proportions on the Diabetic MyPlate handout  Review hypoglycemia in the ADA book!

## 2019-12-30 ENCOUNTER — Ambulatory Visit
Admission: RE | Admit: 2019-12-30 | Discharge: 2019-12-30 | Disposition: A | Discharge status: Home / Self Care | Payer: No Typology Code available for payment source | Source: Ambulatory Visit | Attending: Family Medicine | Admitting: Family Medicine

## 2019-12-30 DIAGNOSIS — N5089 Other specified disorders of the male genital organs: Secondary | ICD-10-CM

## 2019-12-31 ENCOUNTER — Other Ambulatory Visit: Payer: Self-pay

## 2019-12-31 MED ORDER — GLUCOSE BLOOD VI STRP: ORAL_STRIP | 12 refills | Status: DC

## 2020-01-01 ENCOUNTER — Other Ambulatory Visit: Payer: Self-pay | Admitting: *Deleted

## 2020-01-01 MED ORDER — GLUCOSE BLOOD VI STRP: ORAL_STRIP | 12 refills | Status: DC

## 2020-01-23 ENCOUNTER — Encounter: Payer: No Typology Code available for payment source | Attending: Family Medicine | Admitting: Dietician

## 2020-01-23 ENCOUNTER — Encounter: Payer: Self-pay | Admitting: Dietician

## 2020-01-23 ENCOUNTER — Other Ambulatory Visit: Payer: Self-pay

## 2020-01-23 DIAGNOSIS — E1100 Type 2 diabetes mellitus with hyperosmolarity without nonketotic hyperglycemic-hyperosmolar coma (NKHHC): Secondary | ICD-10-CM

## 2020-01-23 DIAGNOSIS — E119 Type 2 diabetes mellitus without complications: Secondary | ICD-10-CM | POA: Insufficient documentation

## 2020-01-23 NOTE — Patient Instructions (Addendum)
Eat your bagel for breakfast on resistance training and pair it with a protein.  Keep on checking your blood sugar fasting 3 times a week, and 2 hours after the beginning of a meal 3 times a week.  Continue eating 1 carb choice snacks.  Keep up the weight loss at 1 pound a week.  Keep on meeting the proportions of the balanced plate!  Follow up after your next A1c!

## 2020-01-23 NOTE — Progress Notes (Signed)
Diabetes Self-Management Education  Visit Type: Follow-up  Appt. Start Time: 1210 Appt. End Time: 1250  01/23/2020  [name], identified by name and date of birth, is a 59 y.o. male with a diagnosis of Diabetes: Type 2.   ASSESSMENT  Pt reports things are going well. Blood sugar readings are better. Checking sometimes fasting and 2 hours after a meal. Fasting values are around 130, Postprandial 130-150 Pt is walking daily for 30 minutes a day, and 20 min on a rowing machine twice a week.  Pt next A1c is in two months. Pt has no remaining residual COVID symptoms. Pt is still looking for work, has two interviews upcoming. Pt reports nausea the morning after taking his Trulicity. Nausea subsides once he eats a meal. Pt reports occasional diarrhea with his metFormin. Pt reports eating a snack right before bed sometimes. Notices his fasting numbers are higher the next morning. Pt has dramatically reduced his late night cereal snacking!  There were no vitals taken for this visit. There is no height or weight on file to calculate BMI.   Diabetes Self-Management Education - 01/23/20 1302      Visit Information   Visit Type Follow-up      Initial Visit   Diabetes Type Type 2    Are you currently following a meal plan? Yes    Are you taking your medications as prescribed? Yes      Health Coping   How would you rate your overall health? Good      Exercise   Exercise Type ADL's;Moderate (swimming / aerobic walking)    How many days per week to you exercise? 7    How many minutes per day do you exercise? 30    Total minutes per week of exercise 210      Individualized Goals (developed by patient)   Nutrition Follow meal plan discussed;General guidelines for healthy choices and portions discussed    Physical Activity Exercise 5-7 days per week    Medications take my medication as prescribed    Monitoring  test my blood glucose as discussed      Patient Self-Evaluation of  Goals - Patient rates self as meeting previously set goals (% of time)   Nutrition >75%    Physical Activity >75%    Medications >75%    Monitoring 25 - 50%    Problem Solving 50 - 75 %    Reducing Risk 50 - 75 %    Health Coping 50 - 75 %      Post-Education Assessment   Patient understands the diabetes disease and treatment process. Needs Review    Patient understands incorporating nutritional management into lifestyle. Demonstrates understanding / competency    Patient undertands incorporating physical activity into lifestyle. Demonstrates understanding / competency    Patient understands using medications safely. Demonstrates understanding / competency    Patient understands monitoring blood glucose, interpreting and using results Needs Review    Patient understands prevention, detection, and treatment of acute complications. Needs Review    Patient understands prevention, detection, and treatment of chronic complications. Needs Review    Patient understands how to develop strategies to address psychosocial issues. Needs Review    Patient understands how to develop strategies to promote health/change behavior. Needs Review      Outcomes   Expected Outcomes Demonstrated interest in learning. Expect positive outcomes    Future DMSE 2 months    Program Status Not Completed      Subsequent  Visit   Since your last visit have you continued or begun to take your medications as prescribed? Yes    Since your last visit have you had your blood pressure checked? No    Since your last visit have you experienced any weight changes? Loss    Weight Loss (lbs) 3    Since your last visit, are you checking your blood glucose at least once a day? No   Pt checked 11 times in the past month. Updated goal to 3 days a week          Individualized Plan for Diabetes Self-Management Training:   Learning Objective:  Patient will have a greater understanding of diabetes self-management. Patient  education plan is to attend individual and/or group sessions per assessed needs and concerns.   Plan:   Patient Instructions  Eat your bagel for breakfast on resistance training and pair it with a protein.  Keep on checking your blood sugar fasting 3 times a week, and 2 hours after the beginning of a meal 3 times a week.  Continue eating 1 carb choice snacks.  Keep up the weight loss at 1 pound a week.  Keep on meeting the proportions of the balanced plate!  Follow up after your next A1c!   Expected Outcomes:  Demonstrated interest in learning. Expect positive outcomes   If problems or questions, patient to contact team via:  Phone and Email  Future DSME appointment: 2 months

## 2020-01-30 ENCOUNTER — Other Ambulatory Visit: Payer: Self-pay | Admitting: Family Medicine

## 2020-02-21 ENCOUNTER — Other Ambulatory Visit: Payer: Self-pay | Admitting: Family Medicine

## 2020-03-04 ENCOUNTER — Other Ambulatory Visit: Payer: Self-pay

## 2020-03-04 ENCOUNTER — Ambulatory Visit (INDEPENDENT_AMBULATORY_CARE_PROVIDER_SITE_OTHER): Payer: No Typology Code available for payment source | Admitting: *Deleted

## 2020-03-04 DIAGNOSIS — Z23 Encounter for immunization: Secondary | ICD-10-CM

## 2020-03-05 NOTE — Progress Notes (Signed)
Pt receive flu vaccine

## 2020-04-14 ENCOUNTER — Other Ambulatory Visit: Payer: No Typology Code available for payment source

## 2020-04-14 ENCOUNTER — Other Ambulatory Visit: Payer: Self-pay

## 2020-04-14 DIAGNOSIS — E1165 Type 2 diabetes mellitus with hyperglycemia: Secondary | ICD-10-CM

## 2020-04-15 ENCOUNTER — Telehealth: Payer: Self-pay

## 2020-04-15 LAB — HEMOGLOBIN A1C
Hgb A1c MFr Bld: 8.4 % of total Hgb — ABNORMAL HIGH (ref <5.7)
Mean Plasma Glucose: 194 mg/dL
eAG (mmol/L): 10.8 mmol/L

## 2020-04-15 NOTE — Telephone Encounter (Signed)
Patient called back for lab results, patient verbalized understanding of results given, and will call back to make appointment after checking his work schedule.

## 2020-04-27 ENCOUNTER — Other Ambulatory Visit: Payer: Self-pay

## 2020-04-27 ENCOUNTER — Ambulatory Visit (INDEPENDENT_AMBULATORY_CARE_PROVIDER_SITE_OTHER): Payer: No Typology Code available for payment source | Admitting: Family Medicine

## 2020-04-27 VITALS — BP 142/80 | HR 71 | Temp 98.3°F | Ht 69.0 in | Wt 225.0 lb

## 2020-04-27 DIAGNOSIS — I10 Essential (primary) hypertension: Secondary | ICD-10-CM | POA: Diagnosis not present

## 2020-04-27 DIAGNOSIS — E1100 Type 2 diabetes mellitus with hyperosmolarity without nonketotic hyperglycemic-hyperosmolar coma (NKHHC): Secondary | ICD-10-CM

## 2020-04-27 MED ORDER — LANCETS MISC: 1 refills | Status: DC

## 2020-04-27 MED ORDER — GLIPIZIDE ER 10 MG PO TB24: 10.0000 mg | ORAL_TABLET | Freq: Every day | ORAL | 5 refills | Status: DC

## 2020-04-27 MED ORDER — GLUCOSE BLOOD VI STRP: ORAL_STRIP | 12 refills | Status: DC

## 2020-04-27 MED ORDER — BLOOD GLUCOSE SYSTEM PAK KIT: PACK | 1 refills | Status: AC

## 2020-04-27 MED ORDER — SILDENAFIL CITRATE 100 MG PO TABS: 50.0000 mg | ORAL_TABLET | Freq: Every day | ORAL | 11 refills | Status: DC | PRN

## 2020-04-27 NOTE — Progress Notes (Signed)
Subjective:    Patient ID: Joseph [name], male    DOB: July 22, 1960, 59 y.o.   MRN: [id number]  HPI  8/21 Patient is a very pleasant 59 year old Caucasian male who is here today for a follow-up.  Since I last saw him, his diabetes test has worsened.  It has increased from 7.3-8.7.  This is despite using Metformin, Trulicity, Jardiance.  He is not getting regular exercise.  He admits that his diet could be better.  He denies any polyuria, polydipsia, or blurry vision.  He has not yet received his Covid vaccination however he did have Covid earlier this year.  He denies any chest pain shortness of breath or dyspnea on exertion.  He does complain of a lump on his left testicle that has been tender and enlarging.  It comes and goes.  On examination today it appears that the mass is actually growing off his epididymis.  I suspect either a varicocele or possibly a spermatocele.  He denies any hematuria or dysuria.  Blood pressure is elevated today at 160/92 however the patient states is typically 135/85 at home.  He states that he is very nervous because he was concerned that the mass may be testicular cancer.  It has been there for 4 months and he was very worried about it.  AT that time, my plan was: Options to manage his blood sugar include switching to insulin or adding Actos and making drastic changes in his diet and exercise pattern.  Patient would like to try this first.  I recommended 20 pounds of weight loss and meeting with a nutritionist.  We will also start Actos 30 mg a day and recheck blood sugar and A1c in 3 months.  Blood pressure is extremely high today however he is very nervous.  He will check his blood pressure at home and call me with values later next week.  Usually is well controlled.  Cholesterol is acceptable.  The mass on his left testicle appears to be a varicocele versus a spermatocele.  I will obtain an ultrasound to evaluate further however I do not believe his testicular  cancer  04/27/20  Patient is here today for a follow-up on his diabetes.  He is currently on Trulicity, Jardiance, Metformin.  He cannot tolerate the higher dose Metformin due to diarrhea and stomach upset.  He never started Actos.  Despite reducing his carbs to 45g/day, his A1c remains at 8.4.  He denies any polyuria, polydipsia, blurry vision.  He denies any chest pain shortness of breath or dyspnea on exertion.  However he continues to eat late at night for pleasure.  He denies any neuropathy in his feet or blurry vision.  He does have some occasional erectile dysfunction Past Medical History:  Diagnosis Date  . Allergy   . Diabetes mellitus without complication ([place])   . Hyperlipidemia   . Hypertension    Past Surgical History:  Procedure Laterality Date  . COLONOSCOPY  2015  . INSERTION OF MESH N/A 10/31/2017   Procedure: INSERTION OF MESH;  Surgeon: Coralie Keens, MD;  Location: [facility];  Service: General;  Laterality: N/A;  . UMBILICAL HERNIA REPAIR N/A 10/31/2017   Procedure: UMBILICAL HERNIA REPAIR WITH MESH;  Surgeon: Coralie Keens, MD;  Location: [facility];  Service: General;  Laterality: N/A;  . VASECTOMY  2006   Current Outpatient Medications on File Prior to Visit  Medication Sig Dispense Refill  . acetaminophen (TYLENOL) 325 MG tablet Take 2 tablets (650 mg total)  by mouth every 6 (six) hours as needed for mild pain or headache (fever >/= 101). 30 tablet 0  . amLODipine (NORVASC) 5 MG tablet TAKE 1 TABLET BY MOUTH EVERY DAY 90 tablet 3  . ascorbic acid (VITAMIN C) 500 MG tablet Take 1 tablet (500 mg total) by mouth daily. 10 tablet 0  . aspirin 81 MG tablet Take 81 mg by mouth daily.    Marland Kitchen atorvastatin (LIPITOR) 40 MG tablet TAKE 1 TABLET BY MOUTH EVERYDAY AT BEDTIME 90 tablet 0  . azelastine (ASTELIN) 0.1 % nasal spray Place 2 sprays into both nostrils 2 (two) times daily. Use in each nostril as directed 30 mL 12  . blood glucose meter kit and supplies KIT Dispense based  on patient and insurance preference. Check fasting blood sugar each morning.  E11.65 1 each 0  . famotidine (PEPCID AC) 10 MG chewable tablet Chew 10 mg by mouth as needed for heartburn.    . fluticasone (FLONASE) 50 MCG/ACT nasal spray Place 2 sprays into both nostrils daily. 48 g 11  . glucose blood test strip Please dispense based on patient and insurance preference. Use as instructed to monitor FSBS 1x daily. Dx E11.9. 100 each 12  . Insulin Pen Needle (NOVOFINE) 30G X 8 MM MISC Inject 10 each into the skin as needed (Daily while on lantus.). 90 each 0  . Ipratropium-Albuterol (COMBIVENT) 20-100 MCG/ACT AERS respimat Inhale 2 puffs into the lungs every 6 (six) hours as needed for wheezing. 4 g 0  . JARDIANCE 25 MG TABS tablet TAKE 1 TABLET BY MOUTH EVERY DAY 90 tablet 3  . lisinopril-hydrochlorothiazide (ZESTORETIC) 20-12.5 MG tablet TAKE 2 TABLETS BY MOUTH EVERY DAY 180 tablet 2  . loratadine (CLARITIN) 10 MG tablet Take 10 mg by mouth daily as needed for allergies.    . metFORMIN (GLUCOPHAGE) 1000 MG tablet TAKE 1 TABLET BY MOUTH TWICE A DAY WITH A MEAL 180 tablet 1  . Multiple Vitamins-Minerals (MULTIVITAMIN PO) Take 1 tablet by mouth daily.     Marland Kitchen nystatin (MYCOSTATIN) 100000 UNIT/ML suspension Take 5 mLs (500,000 Units total) by mouth 4 (four) times daily. 60 mL 0  . OneTouch Delica Lancets 79K MISC USE AS DIRECTED 100 each 3  . pioglitazone (ACTOS) 30 MG tablet Take 1 tablet (30 mg total) by mouth daily. 90 tablet 3  . triamcinolone (KENALOG) 0.1 % paste Use as directed 1 application in the mouth or throat 2 (two) times daily. 20 g 2  . TRULICITY 1.5 WI/0.9BD SOPN INJECT 0.5MLS INTO THE SKIN EVERY SUNDAY 6 mL 3  . zinc sulfate 220 (50 Zn) MG capsule Take 1 capsule (220 mg total) by mouth daily. 10 capsule 0   No current facility-administered medications on file prior to visit.    No Known Allergies Social History   Socioeconomic History  . Marital status: Married    Spouse name:  Not on file  . Number of children: Not on file  . Years of education: Not on file  . Highest education level: Not on file  Occupational History  . Not on file  Tobacco Use  . Smoking status: Never Smoker  . Smokeless tobacco: Never Used  Vaping Use  . Vaping Use: Never used  Substance and Sexual Activity  . Alcohol use: No  . Drug use: No  . Sexual activity: Yes    Comment: married to [place].  Three kids.  exercising daily.  Other Topics Concern  . Not on file  Social History Narrative  . Not on file   Social Determinants of Health   Financial Resource Strain: Not on file  Food Insecurity: Not on file  Transportation Needs: Not on file  Physical Activity: Not on file  Stress: Not on file  Social Connections: Not on file  Intimate Partner Violence: Not on file     Review of Systems  All other systems reviewed and are negative.      Objective:   Physical Exam Vitals reviewed.  Constitutional:      Appearance: He is well-developed.  Cardiovascular:     Rate and Rhythm: Normal rate and regular rhythm.     Heart sounds: Normal heart sounds. No murmur heard. No friction rub. No gallop.   Pulmonary:     Effort: Pulmonary effort is normal. No respiratory distress.     Breath sounds: Normal breath sounds. No stridor. No wheezing or rales.  Chest:     Chest wall: No tenderness.  Abdominal:     General: Bowel sounds are normal. There is no distension.     Palpations: Abdomen is soft. There is no mass.     Tenderness: There is no abdominal tenderness. There is no guarding or rebound.  Neurological:     Mental Status: He is alert and oriented to person, place, and time.     Cranial Nerves: No cranial nerve deficit.     Coordination: Coordination normal.     Deep Tendon Reflexes: Reflexes normal.    Lab on 04/14/2020  Component Date Value Ref Range Status  . Hgb A1c MFr Bld 04/14/2020 8.4* <5.7 % of total Hgb Final   Comment: For someone without known diabetes, a  hemoglobin A1c value of 6.5% or greater indicates that they may have  diabetes and this should be confirmed with a follow-up  test. . For someone with known diabetes, a value <7% indicates  that their diabetes is well controlled and a value  greater than or equal to 7% indicates suboptimal  control. A1c targets should be individualized based on  duration of diabetes, age, comorbid conditions, and  other considerations. . Currently, no consensus exists regarding use of hemoglobin A1c for diagnosis of diabetes for children. .   . Mean Plasma Glucose 04/14/2020 194  mg/dL Final  . eAG (mmol/L) 04/14/2020 10.8  mmol/L Final           Assessment & Plan:  Type 2 diabetes mellitus with hyperosmolarity without coma, without long-term current use of insulin (HCC)  Essential hypertension  We discussed options such as discontinuing medications and starting insulin however the patient would like to try starting Actos 30 mg a day, glipizide extended release 10 mg a day, and reducing Metformin due to gastrointestinal upset to 500 mg twice a day.  He will continue Trulicity and Jardiance and then recheck lab work in 3 months.  He will try to discontinue eating after 8:00 at night and continue exercising and trying to lose weight ultimately to get a goal weight less than 200 pounds.  Reassess in 3 months.  Continue to recommend the Covid vaccine

## 2020-05-20 ENCOUNTER — Other Ambulatory Visit: Payer: Self-pay | Admitting: Family Medicine

## 2020-05-20 DIAGNOSIS — E1165 Type 2 diabetes mellitus with hyperglycemia: Secondary | ICD-10-CM

## 2020-09-02 ENCOUNTER — Telehealth: Payer: Self-pay | Admitting: Family Medicine

## 2020-09-02 MED ORDER — EMPAGLIFLOZIN 25 MG PO TABS: 25.0000 mg | ORAL_TABLET | Freq: Every day | ORAL | 3 refills | Status: DC

## 2020-09-02 MED ORDER — AMLODIPINE BESYLATE 5 MG PO TABS: 1.0000 | ORAL_TABLET | Freq: Every day | ORAL | 3 refills | Status: DC

## 2020-09-02 NOTE — Telephone Encounter (Signed)
Pt called stating that he has new Pensions consultant, and he was having trouble getting approved for these meds  amLODipine (NORVASC) 5 MG tablet  JARDIANCE 25 MG TABS tablet,  Please call if any more info is needed.  Cb#: [id number]

## 2020-09-02 NOTE — Telephone Encounter (Signed)
Prescription sent to pharmacy.

## 2020-09-14 ENCOUNTER — Other Ambulatory Visit: Payer: Self-pay

## 2020-09-14 MED ORDER — ATORVASTATIN CALCIUM 40 MG PO TABS: ORAL_TABLET | ORAL | 0 refills | Status: DC

## 2020-09-23 ENCOUNTER — Other Ambulatory Visit: Payer: Self-pay | Admitting: Family Medicine

## 2020-10-08 ENCOUNTER — Telehealth (INDEPENDENT_AMBULATORY_CARE_PROVIDER_SITE_OTHER): Payer: Managed Care, Other (non HMO) | Admitting: Nurse Practitioner

## 2020-10-08 ENCOUNTER — Other Ambulatory Visit: Payer: Self-pay

## 2020-10-08 ENCOUNTER — Encounter: Payer: Self-pay | Admitting: Nurse Practitioner

## 2020-10-08 VITALS — HR 66

## 2020-10-08 DIAGNOSIS — J069 Acute upper respiratory infection, unspecified: Secondary | ICD-10-CM | POA: Diagnosis not present

## 2020-10-08 MED ORDER — IPRATROPIUM-ALBUTEROL 20-100 MCG/ACT IN AERS: 2.0000 | INHALATION_SPRAY | Freq: Four times a day (QID) | RESPIRATORY_TRACT | 0 refills | Status: DC | PRN

## 2020-10-08 MED ORDER — PREDNISONE 10 MG PO TABS: 10.0000 mg | ORAL_TABLET | Freq: Every day | ORAL | 0 refills | Status: DC

## 2020-10-08 NOTE — Progress Notes (Signed)
Subjective:    Patient ID: Joseph [name], male    DOB: 01/04/1961, 60 y.o.   MRN: [id number]  HPI: Joseph [name] is a 60 y.o. male presenting Via parking lot visit due to COVID-19 pandemic for cough x 2 weeks.  Chief Complaint  Patient presents with   chest congestion    Nasal clear, chest still feels stuffy, taking robitussin and mucinex. Other sx have also cleared up. Had chills ans fever.   UPPER RESPIRATORY TRACT INFECTION Patient reports 2 years ago, he had COVID-19 and double pneumonia and was hospitalized for 8 days.  He wants to prevent that from happening again. Onset: 2.5 weeks ago Fever:  no; had chills in beginning Cough:  yes; productive at times with yellow sputum Shortness of breath: no Wheezing: yes Chest pain: yes, with cough Chest tightness: yes Chest congestion: no Nasal congestion: yes Runny nose: yes Post nasal drip: yes Sneezing: yes Sore throat: no Swollen glands: no Sinus pressure: no Headache: yes Face pain: no Toothache: no Ear pain: no  Ear pressure: no  Eyes red/itching:no Eye drainage/crusting: no  Nausea: no  Vomiting: no Diarrhea: no  Change in appetite: no  Loss of taste/smell: no  Rash: no Fatigue: no Sick contacts: no Strep contacts: no  Context: better Recurrent sinusitis: no Treatments attempted: Guaifenesin, Robitussin Relief with OTC medications: yes  No Known Allergies  Outpatient Encounter Medications as of 10/08/2020  Medication Sig   acetaminophen (TYLENOL) 325 MG tablet Take 2 tablets (650 mg total) by mouth every 6 (six) hours as needed for mild pain or headache (fever >/= 101).   amLODipine (NORVASC) 5 MG tablet Take 1 tablet (5 mg total) by mouth daily.   ascorbic acid (VITAMIN C) 500 MG tablet Take 1 tablet (500 mg total) by mouth daily.   aspirin 81 MG tablet Take 81 mg by mouth daily.   atorvastatin (LIPITOR) 40 MG tablet TAKE 1 TABLET BY MOUTH EVERYDAY AT BEDTIME   azelastine (ASTELIN) 0.1 % nasal spray  Place 2 sprays into both nostrils 2 (two) times daily. Use in each nostril as directed   blood glucose meter kit and supplies KIT Dispense based on patient and insurance preference. Check fasting blood sugar each morning.  E11.65   Blood Glucose Monitoring Suppl (BLOOD GLUCOSE SYSTEM PAK) KIT Please dispense based on patient and insurance preference. Use as instructed to monitor FSBS 1x daily. Dx E11.9.   empagliflozin (JARDIANCE) 25 MG TABS tablet Take 1 tablet (25 mg total) by mouth daily.   famotidine (PEPCID AC) 10 MG chewable tablet Chew 10 mg by mouth as needed for heartburn.   fluticasone (FLONASE) 50 MCG/ACT nasal spray Place 2 sprays into both nostrils daily.   glipiZIDE (GLUCOTROL XL) 10 MG 24 hr tablet Take 1 tablet (10 mg total) by mouth daily with breakfast.   glucose blood test strip Please dispense based on patient and insurance preference. Use as instructed to monitor FSBS 1x daily. Dx E11.9.   Insulin Pen Needle (NOVOFINE) 30G X 8 MM MISC Inject 10 each into the skin as needed (Daily while on lantus.).   Lancets MISC Please dispense based on patient and insurance preference. Use as instructed to monitor FSBS 1x daily. Dx E11.9.   lisinopril-hydrochlorothiazide (ZESTORETIC) 20-12.5 MG tablet TAKE 2 TABLETS BY MOUTH EVERY DAY   loratadine (CLARITIN) 10 MG tablet Take 10 mg by mouth daily as needed for allergies.   metFORMIN (GLUCOPHAGE) 1000 MG tablet TAKE 1 TABLET BY MOUTH TWICE A  DAY WITH A MEAL   Multiple Vitamins-Minerals (MULTIVITAMIN PO) Take 1 tablet by mouth daily.    nystatin (MYCOSTATIN) 100000 UNIT/ML suspension Take 5 mLs (500,000 Units total) by mouth 4 (four) times daily.   pioglitazone (ACTOS) 30 MG tablet Take 1 tablet (30 mg total) by mouth daily.   predniSONE (DELTASONE) 10 MG tablet Take 1 tablet (10 mg total) by mouth daily with breakfast. Take 7m on days 1-2. Take 378mon days 3-4. Take 2043mn days 5-6. Take 1m68m days 7-8. Take 5mg 32mdays 9-10, then stop.    sildenafil (VIAGRA) 100 MG tablet Take 0.5-1 tablets (50-100 mg total) by mouth daily as needed for erectile dysfunction.   triamcinolone (KENALOG) 0.1 % paste Use as directed 1 application in the mouth or throat 2 (two) times daily.   TRULICITY 1.5 MG/0.KG/4.0NU INJECT 0.5MLS INTO THE SKIN EVERY SUNDAY   zinc sulfate 220 (50 Zn) MG capsule Take 1 capsule (220 mg total) by mouth daily.   [DISCONTINUED] Ipratropium-Albuterol (COMBIVENT) 20-100 MCG/ACT AERS respimat Inhale 2 puffs into the lungs every 6 (six) hours as needed for wheezing.   Ipratropium-Albuterol (COMBIVENT) 20-100 MCG/ACT AERS respimat Inhale 2 puffs into the lungs every 6 (six) hours as needed for wheezing.   No facility-administered encounter medications on file as of 10/08/2020.    Patient Active Problem List   Diagnosis Date Noted   Acute hypoxemic respiratory failure due to COVID-19 (HCC)[facility]25/[facility]   Hypokalemia 05/26/2019   Elevated LFTs 08/26/2015   Diabetes mellitus, type 2 (HCC) [place]20/2017   Hyperlipidemia associated with type 2 diabetes mellitus ([facility] [facility] [facility]     Past Medical History:  Diagnosis Date   Allergy    Di[place] complication ([facility]    Hypertension     Relevant past medical, surgical, family and social history reviewed and updated as indicated. Interim medical history since our last visit reviewed.  Review of Systems Per HPI unless specifically indicated above     Objective:    Pulse 66   SpO2 93%   Wt Readings from Last 3 Encounters:  04/27/20 225 lb (102.1 kg)  12/25/19 221 lb 4.8 oz (100.4 kg)  12/19/19 223 lb (101.2 kg)    Physical Exam Vitals and nursing note reviewed.  Constitutional:      General: He is not in acute distress.    Appearance: Normal appearance. He is not ill-appearing, toxic-appearing or diaphoretic.  HENT:     Head: Normocephalic.     Nose: Nose normal. No congestion.      Mouth/Throat:     Mouth: Mucous membranes are moist.     Pharynx: Oropharynx is clear. No oropharyngeal exudate or posterior oropharyngeal erythema.  Eyes:     General: No scleral icterus.    Extraocular Movements: Extraocular movements intact.  Cardiovascular:     Rate and Rhythm: Normal rate and regular rhythm.     Heart sounds: Normal heart sounds. No murmur heard. Pulmonary:     Effort: Pulmonary effort is normal. No respiratory distress.     Breath sounds: Normal breath sounds. No wheezing, rhonchi or rales.  Musculoskeletal:     Cervical back: Normal range of motion.  Lymphadenopathy:     Cervical: No cervical adenopathy.  Skin:    General: Skin is warm and dry.     Coloration: Skin is not jaundiced or pale.     Findings: No  erythema.  Neurological:     Mental Status: He is alert and oriented to person, place, and time.  Psychiatric:        Mood and Affect: Mood normal.        Behavior: Behavior normal.        Thought Content: Thought content normal.        Judgment: Judgment normal.      Assessment & Plan:  1. Upper respiratory tract infection, unspecified type Acute.  Likely viral in etiology and appears to be left with postviral cough.  Lung sounds are clear today and vital signs are stable.  I do not think this patient has pneumonia at this time, although I did offer a chest x-ray.  We will obtain a respiratory panel testing and start on prednisone taper to help decrease inflammation.  Patient likely has some chronic changes to his lungs as result of COVID-19 2 years ago.  We will resume inhaler.  Patient to follow-up next week if symptoms or not improving.  With any sudden onset of chest pain or shortness of breath, patient was instructed to go to ER.  - SARS-CoV-2 RNA (COVID-19) and Respiratory Viral Panel, Qualitative NAAT - predniSONE (DELTASONE) 10 MG tablet; Take 1 tablet (10 mg total) by mouth daily with breakfast. Take 34m on days 1-2. Take 328mon days 3-4. Take  2030mn days 5-6. Take 21m60m days 7-8. Take 5mg 65mdays 9-10, then stop.  Dispense: 21 tablet; Refill: 0 - Ipratropium-Albuterol (COMBIVENT) 20-100 MCG/ACT AERS respimat; Inhale 2 puffs into the lungs every 6 (six) hours as needed for wheezing.  Dispense: 4 g; Refill: 0     Follow up plan: Return if symptoms worsen or fail to improve.

## 2020-10-11 ENCOUNTER — Other Ambulatory Visit: Payer: Self-pay | Admitting: *Deleted

## 2020-10-11 MED ORDER — SILDENAFIL CITRATE 100 MG PO TABS: 50.0000 mg | ORAL_TABLET | Freq: Every day | ORAL | 11 refills | Status: DC | PRN

## 2020-10-12 LAB — SARS-COV-2 RNA (COVID-19) RESP VIRAL PNL QL NAAT
Adenovirus B: NOT DETECTED
HUMAN PARAINFLU VIRUS 1: NOT DETECTED
HUMAN PARAINFLU VIRUS 2: NOT DETECTED
HUMAN PARAINFLU VIRUS 3: DETECTED — AB
INFLUENZA A SUBTYPE H1: NOT DETECTED
INFLUENZA A SUBTYPE H3: NOT DETECTED
Influenza A: NOT DETECTED
Influenza B: NOT DETECTED
Metapneumovirus: NOT DETECTED
Respiratory Syncytial Virus A: NOT DETECTED
Respiratory Syncytial Virus B: NOT DETECTED
Rhinovirus: NOT DETECTED
SARS CoV2 RNA: NOT DETECTED

## 2020-10-20 ENCOUNTER — Telehealth: Payer: Self-pay

## 2020-10-20 DIAGNOSIS — J069 Acute upper respiratory infection, unspecified: Secondary | ICD-10-CM

## 2020-10-21 ENCOUNTER — Other Ambulatory Visit: Payer: Self-pay

## 2020-10-21 MED ORDER — AZITHROMYCIN 250 MG PO TABS: ORAL_TABLET | ORAL | 0 refills | Status: DC

## 2020-10-21 NOTE — Telephone Encounter (Signed)
Medication sent to pharmacy and chest xray ordered

## 2020-10-22 ENCOUNTER — Ambulatory Visit
Admission: RE | Admit: 2020-10-22 | Discharge: 2020-10-22 | Disposition: A | Discharge status: Home / Self Care | Payer: Managed Care, Other (non HMO) | Source: Ambulatory Visit | Attending: Nurse Practitioner | Admitting: Nurse Practitioner

## 2020-10-22 DIAGNOSIS — J069 Acute upper respiratory infection, unspecified: Secondary | ICD-10-CM

## 2020-10-27 ENCOUNTER — Telehealth: Payer: Self-pay | Admitting: Family Medicine

## 2020-10-27 NOTE — Telephone Encounter (Signed)
Pt called in requesting a refill of glipiZIDE (GLUCOTROL XL) 10 MG 24 . Pt stated that he is completely out of this med.  Cb#: [phone]

## 2020-10-28 ENCOUNTER — Other Ambulatory Visit: Payer: Self-pay | Admitting: Family Medicine

## 2020-12-01 ENCOUNTER — Other Ambulatory Visit: Payer: Self-pay | Admitting: Family Medicine

## 2021-01-20 ENCOUNTER — Other Ambulatory Visit: Payer: Self-pay

## 2021-01-20 ENCOUNTER — Other Ambulatory Visit: Payer: Managed Care, Other (non HMO)

## 2021-01-20 DIAGNOSIS — E785 Hyperlipidemia, unspecified: Secondary | ICD-10-CM

## 2021-01-20 DIAGNOSIS — I10 Essential (primary) hypertension: Secondary | ICD-10-CM

## 2021-01-20 DIAGNOSIS — Z136 Encounter for screening for cardiovascular disorders: Secondary | ICD-10-CM

## 2021-01-20 DIAGNOSIS — Z13220 Encounter for screening for lipoid disorders: Secondary | ICD-10-CM

## 2021-01-20 DIAGNOSIS — E1100 Type 2 diabetes mellitus with hyperosmolarity without nonketotic hyperglycemic-hyperosmolar coma (NKHHC): Secondary | ICD-10-CM

## 2021-01-20 DIAGNOSIS — Z125 Encounter for screening for malignant neoplasm of prostate: Secondary | ICD-10-CM

## 2021-01-21 LAB — CBC WITH DIFFERENTIAL/PLATELET
Absolute Monocytes: 847 cells/uL (ref 200–950)
Basophils Absolute: 66 cells/uL (ref 0–200)
Basophils Relative: 0.8 %
Eosinophils Absolute: 158 cells/uL (ref 15–500)
Eosinophils Relative: 1.9 %
HCT: 52.3 % — ABNORMAL HIGH (ref 38.5–50.0)
Hemoglobin: 17.3 g/dL — ABNORMAL HIGH (ref 13.2–17.1)
Lymphs Abs: 2880 cells/uL (ref 850–3900)
MCH: 28.7 pg (ref 27.0–33.0)
MCHC: 33.1 g/dL (ref 32.0–36.0)
MCV: 86.7 fL (ref 80.0–100.0)
MPV: 11.3 fL (ref 7.5–12.5)
Monocytes Relative: 10.2 %
Neutro Abs: 4349 cells/uL (ref 1500–7800)
Neutrophils Relative %: 52.4 %
Platelets: 234 10*3/uL (ref 140–400)
RBC: 6.03 10*6/uL — ABNORMAL HIGH (ref 4.20–5.80)
RDW: 14.4 % (ref 11.0–15.0)
Total Lymphocyte: 34.7 %
WBC: 8.3 10*3/uL (ref 3.8–10.8)

## 2021-01-21 LAB — COMPLETE METABOLIC PANEL WITH GFR
AG Ratio: 1.7 (calc) (ref 1.0–2.5)
ALT: 53 U/L — ABNORMAL HIGH (ref 9–46)
AST: 29 U/L (ref 10–35)
Albumin: 4.3 g/dL (ref 3.6–5.1)
Alkaline phosphatase (APISO): 58 U/L (ref 35–144)
BUN: 18 mg/dL (ref 7–25)
CO2: 25 mmol/L (ref 20–32)
Calcium: 9.3 mg/dL (ref 8.6–10.3)
Chloride: 103 mmol/L (ref 98–110)
Creat: 1.03 mg/dL (ref 0.70–1.30)
Globulin: 2.6 g/dL (calc) (ref 1.9–3.7)
Glucose, Bld: 158 mg/dL — ABNORMAL HIGH (ref 65–99)
Potassium: 3.6 mmol/L (ref 3.5–5.3)
Sodium: 141 mmol/L (ref 135–146)
Total Bilirubin: 0.6 mg/dL (ref 0.2–1.2)
Total Protein: 6.9 g/dL (ref 6.1–8.1)
eGFR: 84 mL/min/{1.73_m2} (ref >=60)

## 2021-01-21 LAB — LIPID PANEL
Cholesterol: 135 mg/dL (ref <200)
HDL: 40 mg/dL (ref >=40)
LDL Cholesterol (Calc): 70 mg/dL (calc)
Non-HDL Cholesterol (Calc): 95 mg/dL (calc) (ref <130)
Total CHOL/HDL Ratio: 3.4 (calc) (ref <5.0)
Triglycerides: 171 mg/dL — ABNORMAL HIGH (ref <150)

## 2021-01-21 LAB — MICROALBUMIN / CREATININE URINE RATIO
Creatinine, Urine: 40 mg/dL (ref 20–320)
Microalb Creat Ratio: 98 mcg/mg creat — ABNORMAL HIGH (ref <30)
Microalb, Ur: 3.9 mg/dL

## 2021-01-21 LAB — PSA: PSA: 1.45 ng/mL (ref <=4.00)

## 2021-01-21 LAB — HEMOGLOBIN A1C
Hgb A1c MFr Bld: 8.4 % of total Hgb — ABNORMAL HIGH (ref <5.7)
Mean Plasma Glucose: 194 mg/dL
eAG (mmol/L): 10.8 mmol/L

## 2021-01-26 ENCOUNTER — Other Ambulatory Visit: Payer: Self-pay | Admitting: Nurse Practitioner

## 2021-01-26 ENCOUNTER — Other Ambulatory Visit: Payer: Self-pay | Admitting: Family Medicine

## 2021-01-26 DIAGNOSIS — E1165 Type 2 diabetes mellitus with hyperglycemia: Secondary | ICD-10-CM

## 2021-01-26 DIAGNOSIS — J069 Acute upper respiratory infection, unspecified: Secondary | ICD-10-CM

## 2021-01-26 NOTE — Telephone Encounter (Signed)
Last 04/27/20 Last OV 10/08/20  Please advise

## 2021-01-27 ENCOUNTER — Ambulatory Visit (INDEPENDENT_AMBULATORY_CARE_PROVIDER_SITE_OTHER): Payer: Managed Care, Other (non HMO) | Admitting: Family Medicine

## 2021-01-27 ENCOUNTER — Other Ambulatory Visit: Payer: Self-pay

## 2021-01-27 ENCOUNTER — Encounter: Payer: Self-pay | Admitting: Family Medicine

## 2021-01-27 VITALS — BP 140/84 | HR 76 | Temp 97.2°F | Ht 69.0 in | Wt 240.0 lb

## 2021-01-27 DIAGNOSIS — Z0000 Encounter for general adult medical examination without abnormal findings: Secondary | ICD-10-CM

## 2021-01-27 DIAGNOSIS — E1100 Type 2 diabetes mellitus with hyperosmolarity without nonketotic hyperglycemic-hyperosmolar coma (NKHHC): Secondary | ICD-10-CM

## 2021-01-27 DIAGNOSIS — E785 Hyperlipidemia, unspecified: Secondary | ICD-10-CM | POA: Diagnosis not present

## 2021-01-27 DIAGNOSIS — E1165 Type 2 diabetes mellitus with hyperglycemia: Secondary | ICD-10-CM

## 2021-01-27 DIAGNOSIS — I10 Essential (primary) hypertension: Secondary | ICD-10-CM | POA: Diagnosis not present

## 2021-01-27 DIAGNOSIS — Z0001 Encounter for general adult medical examination with abnormal findings: Secondary | ICD-10-CM | POA: Diagnosis not present

## 2021-01-27 DIAGNOSIS — Z23 Encounter for immunization: Secondary | ICD-10-CM | POA: Diagnosis not present

## 2021-01-27 MED ORDER — ATORVASTATIN CALCIUM 40 MG PO TABS: ORAL_TABLET | ORAL | 0 refills | Status: DC

## 2021-01-27 MED ORDER — METFORMIN HCL 1000 MG PO TABS: ORAL_TABLET | ORAL | 1 refills | Status: DC

## 2021-01-27 MED ORDER — TRULICITY 1.5 MG/0.5ML ~~LOC~~ SOAJ: SUBCUTANEOUS | 1 refills | Status: DC

## 2021-01-27 MED ORDER — LISINOPRIL-HYDROCHLOROTHIAZIDE 20-12.5 MG PO TABS: 2.0000 | ORAL_TABLET | Freq: Every day | ORAL | 1 refills | Status: DC

## 2021-01-27 NOTE — Progress Notes (Signed)
Subjective:    Patient ID: Joseph [name], male    DOB: 02/26/1961, 60 y.o.   MRN: [id number]  At the patient's last visit, we reduce metformin due to diarrhea and started him on glipizide and pioglitazone.  He is here today for a complete physical exam.  Unfortunately his A1c is still at 8.4.  The diarrhea has improved on less metformin however the patient has seen more weight gain.  He is very concerned by the weight gain and wants to come off the Actos and the glipizide.  Unfortunately his A1c is out of control.  His colonoscopy is up-to-date.  His shot record is included below Immunization History  Administered Date(s) Administered   Influenza,inj,Quad PF,6+ Mos 01/17/2013, 03/06/2014, 03/17/2016, 03/07/2017, 01/18/2018, 01/31/2019, 03/04/2020, 01/27/2021   Pneumococcal Polysaccharide-23 10/19/2017   Td 02/08/2004   Tdap 01/06/2011   He is due today for a flu shot.  Pneumonia vaccine is up-to-date.  He is due for a shingles vaccine as well as a COVID booster. Lab on 01/20/2021  Component Date Value Ref Range Status   WBC 01/20/2021 8.3  3.8 - 10.8 Thousand/uL Final   RBC 01/20/2021 6.03 (A) 4.20 - 5.80 Million/uL Final   Hemoglobin 01/20/2021 17.3 (A) 13.2 - 17.1 g/dL Final   HCT 01/20/2021 52.3 (A) 38.5 - 50.0 % Final   MCV 01/20/2021 86.7  80.0 - 100.0 fL Final   MCH 01/20/2021 28.7  27.0 - 33.0 pg Final   MCHC 01/20/2021 33.1  32.0 - 36.0 g/dL Final   RDW 01/20/2021 14.4  11.0 - 15.0 % Final   Platelets 01/20/2021 234  140 - 400 Thousand/uL Final   MPV 01/20/2021 11.3  7.5 - 12.5 fL Final   Neutro Abs 01/20/2021 4,349  1,500 - 7,800 cells/uL Final   Lymphs Abs 01/20/2021 2,880  850 - 3,900 cells/uL Final   Absolute Monocytes 01/20/2021 847  200 - 950 cells/uL Final   Eosinophils Absolute 01/20/2021 158  15 - 500 cells/uL Final   Basophils Absolute 01/20/2021 66  0 - 200 cells/uL Final   Neutrophils Relative % 01/20/2021 52.4  % Final   Total Lymphocyte 01/20/2021 34.7  % Final    Monocytes Relative 01/20/2021 10.2  % Final   Eosinophils Relative 01/20/2021 1.9  % Final   Basophils Relative 01/20/2021 0.8  % Final   Glucose, Bld 01/20/2021 158 (A) 65 - 99 mg/dL Final   Comment: .            Fasting reference interval . For someone without known diabetes, a glucose value >125 mg/dL indicates that they may have diabetes and this should be confirmed with a follow-up test. .    BUN 01/20/2021 18  7 - 25 mg/dL Final   Creat 01/20/2021 1.03  0.70 - 1.30 mg/dL Final   eGFR 01/20/2021 84  > OR = 60 mL/min/1.28m Final   Comment: The eGFR is based on the CKD-EPI 2021 equation. To calculate  the new eGFR from a previous Creatinine or Cystatin C result, go to https://www.kidney.org/professionals/ kdoqi/gfr%5Fcalculator    BUN/Creatinine Ratio 052/84/1324NOT APPLICABLE  6 - 22 (calc) Final   Sodium 01/20/2021 141  135 - 146 mmol/L Final   Potassium 01/20/2021 3.6  3.5 - 5.3 mmol/L Final   Chloride 01/20/2021 103  98 - 110 mmol/L Final   CO2 01/20/2021 25  20 - 32 mmol/L Final   Calcium 01/20/2021 9.3  8.6 - 10.3 mg/dL Final   Total Protein 01/20/2021 6.9  6.1 -  8.1 g/dL Final   Albumin 01/20/2021 4.3  3.6 - 5.1 g/dL Final   Globulin 01/20/2021 2.6  1.9 - 3.7 g/dL (calc) Final   AG Ratio 01/20/2021 1.7  1.0 - 2.5 (calc) Final   Total Bilirubin 01/20/2021 0.6  0.2 - 1.2 mg/dL Final   Alkaline phosphatase (APISO) 01/20/2021 58  35 - 144 U/L Final   AST 01/20/2021 29  10 - 35 U/L Final   ALT 01/20/2021 53 (A) 9 - 46 U/L Final   Hgb A1c MFr Bld 01/20/2021 8.4 (A) <5.7 % of total Hgb Final   Comment: For someone without known diabetes, a hemoglobin A1c value of 6.5% or greater indicates that they may have  diabetes and this should be confirmed with a follow-up  test. . For someone with known diabetes, a value <7% indicates  that their diabetes is well controlled and a value  greater than or equal to 7% indicates suboptimal  control. A1c targets should be  individualized based on  duration of diabetes, age, comorbid conditions, and  other considerations. . Currently, no consensus exists regarding use of hemoglobin A1c for diagnosis of diabetes for children. .    Mean Plasma Glucose 01/20/2021 194  mg/dL Final   eAG (mmol/L) 01/20/2021 10.8  mmol/L Final   Creatinine, Urine 01/20/2021 40  20 - 320 mg/dL Final   Microalb, Ur 01/20/2021 3.9  mg/dL Final   Comment: Reference Range Not established    Microalb Creat Ratio 01/20/2021 98 (A) <30 mcg/mg creat Final   Comment: . The ADA defines abnormalities in albumin excretion as follows: Marland Kitchen Albuminuria Category        Result (mcg/mg creatinine) . Normal to Mildly increased   <30 Moderately increased         30-299  Severely increased           > OR = 300 . The ADA recommends that at least two of three specimens collected within a 3-6 month period be abnormal before considering a patient to be within a diagnostic category.    Cholesterol 01/20/2021 135  <200 mg/dL Final   HDL 01/20/2021 40  > OR = 40 mg/dL Final   Triglycerides 01/20/2021 171 (A) <150 mg/dL Final   LDL Cholesterol (Calc) 01/20/2021 70  mg/dL (calc) Final   Comment: Reference range: <100 . Desirable range <100 mg/dL for primary prevention;   <70 mg/dL for patients with CHD or diabetic patients  with > or = 2 CHD risk factors. Marland Kitchen LDL-C is now calculated using the Martin-Hopkins  calculation, which is a validated novel method providing  better accuracy than the Friedewald equation in the  estimation of LDL-C.  Cresenciano Genre et al. Annamaria Helling. 9381;829(93): 2061-2068  (http://education.[facility].com/faq/FAQ164)    Total CHOL/HDL Ratio 01/20/2021 3.4  <5.0 (calc) Final   Non-HDL Cholesterol (Calc) 01/20/2021 95  <130 mg/dL (calc) Final   Comment: For patients with diabetes plus 1 major ASCVD risk  factor, treating to a non-HDL-C goal of <100 mg/dL  (LDL-C of <70 mg/dL) is considered a therapeutic  option.    PSA  01/20/2021 1.45  < OR = 4.00 ng/mL Final   Comment: The total PSA value from this assay system is  standardized against the WHO standard. The test  result will be approximately 20% lower when compared  to the equimolar-standardized total PSA (Beckman  Coulter). Comparison of serial PSA results should be  interpreted with this fact in mind. . This test was performed using the Siemens  chemiluminescent method. Values obtained from  different assay methods cannot be used interchangeably. PSA levels, regardless of value, should not be interpreted as absolute evidence of the presence or absence of disease.     Past Medical History:  Diagnosis Date   Allergy    Diabetes mellitus without complication ([place])    Hyperlipidemia    Hypertension    Past Surgical History:  Procedure Laterality Date   COLONOSCOPY  2015   INSERTION OF MESH N/A 10/31/2017   Procedure: INSERTION OF MESH;  Surgeon: Coralie Keens, MD;  Location: [facility];  Service: General;  Laterality: N/A;   UMBILICAL HERNIA REPAIR N/A 10/31/2017   Procedure: UMBILICAL HERNIA REPAIR WITH MESH;  Surgeon: Coralie Keens, MD;  Location: [facility];  Service: General;  Laterality: N/A;   VASECTOMY  2006   Current Outpatient Medications on File Prior to Visit  Medication Sig Dispense Refill   acetaminophen (TYLENOL) 325 MG tablet Take 2 tablets (650 mg total) by mouth every 6 (six) hours as needed for mild pain or headache (fever >/= 101). 30 tablet 0   amLODipine (NORVASC) 5 MG tablet Take 1 tablet (5 mg total) by mouth daily. 90 tablet 3   ascorbic acid (VITAMIN C) 500 MG tablet Take 1 tablet (500 mg total) by mouth daily. 10 tablet 0   aspirin 81 MG tablet Take 81 mg by mouth daily.     atorvastatin (LIPITOR) 40 MG tablet TAKE 1 TABLET BY MOUTH EVERYDAY AT BEDTIME 90 tablet 0   blood glucose meter kit and supplies KIT Dispense based on patient and insurance preference. Check fasting blood sugar each morning.  E11.65 1 each 0    Blood Glucose Monitoring Suppl (BLOOD GLUCOSE SYSTEM PAK) KIT Please dispense based on patient and insurance preference. Use as instructed to monitor FSBS 1x daily. Dx E11.9. 1 kit 1   empagliflozin (JARDIANCE) 25 MG TABS tablet Take 1 tablet (25 mg total) by mouth daily. 90 tablet 3   famotidine (PEPCID AC) 10 MG chewable tablet Chew 10 mg by mouth as needed for heartburn.     fluticasone (FLONASE) 50 MCG/ACT nasal spray Place 2 sprays into both nostrils daily. 48 g 11   glipiZIDE (GLUCOTROL XL) 10 MG 24 hr tablet TAKE 1 TABLET BY MOUTH DAILY WITH BREAKFAST. 90 tablet 1   glucose blood test strip Please dispense based on patient and insurance preference. Use as instructed to monitor FSBS 1x daily. Dx E11.9. 100 each 12   Insulin Pen Needle (NOVOFINE) 30G X 8 MM MISC Inject 10 each into the skin as needed (Daily while on lantus.). 90 each 0   lisinopril-hydrochlorothiazide (ZESTORETIC) 20-12.5 MG tablet TAKE 2 TABLETS BY MOUTH EVERY DAY 180 tablet 2   loratadine (CLARITIN) 10 MG tablet Take 10 mg by mouth daily as needed for allergies.     metFORMIN (GLUCOPHAGE) 1000 MG tablet TAKE 1 TABLET BY MOUTH TWICE A DAY WITH A MEAL 180 tablet 1   Multiple Vitamins-Minerals (MULTIVITAMIN PO) Take 1 tablet by mouth daily.      nystatin (MYCOSTATIN) 100000 UNIT/ML suspension Take 5 mLs (500,000 Units total) by mouth 4 (four) times daily. 60 mL 0   OneTouch Delica Lancets 11B MISC USE AS DIRECTED EVERY DAY 100 each 1   pioglitazone (ACTOS) 30 MG tablet Take 1 tablet (30 mg total) by mouth daily. 90 tablet 3   sildenafil (VIAGRA) 100 MG tablet Take 0.5-1 tablets (50-100 mg total) by mouth daily as needed for erectile dysfunction. [place]  tablet 11   triamcinolone (KENALOG) 0.1 % paste Use as directed 1 application in the mouth or throat 2 (two) times daily. 20 g 2   TRULICITY 1.5 AS/3.4HD SOPN INJECT 0.5MLS INTO THE SKIN EVERY SUNDAY 6 mL 3   zinc sulfate 220 (50 Zn) MG capsule Take 1 capsule (220 mg total) by mouth  daily. 10 capsule 0   No current facility-administered medications on file prior to visit.   No Known Allergies Social History   Socioeconomic History   Marital status: Single    Spouse name: Not on file   Number of children: Not on file   Years of education: Not on file   Highest education level: Not on file  Occupational History   Not on file  Tobacco Use   Smoking status: Never   Smokeless tobacco: Never  Vaping Use   Vaping Use: Never used  Substance and Sexual Activity   Alcohol use: No   Drug use: No   Sexual activity: Yes    Comment: married to [place].  Three kids.  exercising daily.  Other Topics Concern   Not on file  Social History Narrative   Not on file   Social Determinants of Health   Financial Resource Strain: Not on file  Food Insecurity: Not on file  Transportation Needs: Not on file  Physical Activity: Not on file  Stress: Not on file  Social Connections: Not on file  Intimate Partner Violence: Not on file   Family History  Problem Relation Age of Onset   Heart disease Father    Stroke Father    Hypertension Sister    Heart disease Mother    Colon cancer Neg Hx       Review of Systems  Respiratory:  Negative for apnea, cough, choking, chest tightness, shortness of breath and wheezing.   Cardiovascular:  Negative for chest pain, palpitations and leg swelling.  Gastrointestinal:  Negative for abdominal distention, abdominal pain and diarrhea.  Genitourinary:  Negative for difficulty urinating, dysuria, frequency and hematuria.  All other systems reviewed and are negative.     Objective:   Physical Exam Vitals reviewed.  Constitutional:      Appearance: He is well-developed. He is not diaphoretic.  HENT:     Head: Normocephalic and atraumatic.     Right Ear: External ear normal.     Left Ear: External ear normal.     Nose: Nose normal.     Mouth/Throat:     Pharynx: No oropharyngeal exudate.  Eyes:     General: No scleral icterus.        Right eye: No discharge.        Left eye: No discharge.     Conjunctiva/sclera: Conjunctivae normal.     Pupils: Pupils are equal, round, and reactive to light.  Neck:     Thyroid: No thyromegaly.     Vascular: No JVD.     Trachea: No tracheal deviation.  Cardiovascular:     Rate and Rhythm: Normal rate and regular rhythm.     Heart sounds: Normal heart sounds. No murmur heard.   No friction rub. No gallop.  Pulmonary:     Effort: Pulmonary effort is normal. No respiratory distress.     Breath sounds: Normal breath sounds. No stridor. No wheezing or rales.  Chest:     Chest wall: No tenderness.  Abdominal:     General: Bowel sounds are normal. There is no distension.     Palpations:  Abdomen is soft. There is no mass.     Tenderness: There is no abdominal tenderness. There is no guarding or rebound.  Musculoskeletal:        General: No tenderness. Normal range of motion.     Cervical back: Normal range of motion and neck supple.  Lymphadenopathy:     Cervical: No cervical adenopathy.  Skin:    General: Skin is warm.     Coloration: Skin is not pale.     Findings: No erythema or rash.  Neurological:     Mental Status: He is alert and oriented to person, place, and time.     Cranial Nerves: No cranial nerve deficit.     Motor: No abnormal muscle tone.     Coordination: Coordination normal.     Deep Tendon Reflexes: Reflexes are normal and symmetric.  Psychiatric:        Behavior: Behavior normal.        Thought Content: Thought content normal.        Judgment: Judgment normal.          Assessment & Plan:  Encounter for immunization - Plan: Flu Vaccine QUAD 31moIM (Fluarix, Fluzone & Alfiuria Quad PF)  Routine general medical examination at a health care facility  Type 2 diabetes mellitus with hyperosmolarity without coma, without long-term current use of insulin (HCC)  Essential hypertension  Hyperlipidemia, unspecified hyperlipidemia type Diabetes is  poorly controlled.  To stop pioglitazone and glipizide he will need to start insulin.  Patient declines this today.  He wants to stop pioglitazone and glipizide to try to lose weight.  He will increase metformin back to 1000 mg twice daily and he states that he is going to try to lose 25 to 30 pounds over the next 3 months.  He wants to recheck his lab work in 3 months and if not better at that time he will entertain insulin.  Honestly, I do not feel that this will work however the patient is determined to try.  He received his flu shot today.  I recommended shingles vaccine.  The remainder of his cancer screening is up-to-date.  His cholesterol is acceptable as is his PSA

## 2021-02-01 ENCOUNTER — Other Ambulatory Visit: Payer: Self-pay | Admitting: *Deleted

## 2021-02-01 MED ORDER — FREESTYLE LIBRE 2 SENSOR MISC: 3 refills | Status: DC

## 2021-02-09 ENCOUNTER — Encounter: Payer: Self-pay | Admitting: Family Medicine

## 2021-04-14 ENCOUNTER — Other Ambulatory Visit: Payer: Self-pay | Admitting: Family Medicine

## 2021-04-14 ENCOUNTER — Other Ambulatory Visit: Payer: Self-pay

## 2021-04-14 ENCOUNTER — Encounter: Payer: Self-pay | Admitting: Family Medicine

## 2021-04-14 ENCOUNTER — Ambulatory Visit (INDEPENDENT_AMBULATORY_CARE_PROVIDER_SITE_OTHER): Payer: Managed Care, Other (non HMO) | Admitting: Family Medicine

## 2021-04-14 VITALS — BP 142/88 | HR 95 | Resp 18 | Ht 69.0 in | Wt 228.0 lb

## 2021-04-14 DIAGNOSIS — J385 Laryngeal spasm: Secondary | ICD-10-CM | POA: Diagnosis not present

## 2021-04-14 DIAGNOSIS — J0110 Acute frontal sinusitis, unspecified: Secondary | ICD-10-CM

## 2021-04-14 MED ORDER — AMOXICILLIN-POT CLAVULANATE 875-125 MG PO TABS: 1.0000 | ORAL_TABLET | Freq: Two times a day (BID) | ORAL | 0 refills | Status: DC

## 2021-04-14 MED ORDER — ALBUTEROL SULFATE HFA 108 (90 BASE) MCG/ACT IN AERS: 2.0000 | INHALATION_SPRAY | Freq: Four times a day (QID) | RESPIRATORY_TRACT | 0 refills | Status: DC | PRN

## 2021-04-14 NOTE — Progress Notes (Signed)
Subjective:    Patient ID: Joseph [name], male    DOB: [birth year], 60 y.o.   MRN: [id number]  HPI  Patient has been battling a head cold for more than a month.  This includes pressure in his left frontal sinus, congestion, rhinorrhea, coughing.  Is been gradually getting better.  However he has developed the feeling of swelling in his throat.  He states that its not his esophagus.  He has no trouble swallowing food or liquids.  Instead he feels like his trachea is tightening on him.  He denies any lip swelling or tongue swelling.  He reports occasional wheezing.  He also has a dull headache and pain above his left eye and pressure behind his left eye headache in the crown of his head.  He reports postnasal drip and rhinorrhea.  Patient even took prednisone that he had at home without any benefit to the "swelling sensation in his throat".  Past Medical History:  Diagnosis Date   Allergy    Diabetes mellitus without complication ([place])    Hyperlipidemia    Hypertension    Past Surgical History:  Procedure Laterality Date   COLONOSCOPY  2015   INSERTION OF MESH N/A 10/31/2017   Procedure: INSERTION OF MESH;  Surgeon: Coralie Keens, MD;  Location: [facility];  Service: General;  Laterality: N/A;   UMBILICAL HERNIA REPAIR N/A 10/31/2017   Procedure: UMBILICAL HERNIA REPAIR WITH MESH;  Surgeon: Coralie Keens, MD;  Location: [facility];  Service: General;  Laterality: N/A;   VASECTOMY  2006   Current Outpatient Medications on File Prior to Visit  Medication Sig Dispense Refill   acetaminophen (TYLENOL) 325 MG tablet Take 2 tablets (650 mg total) by mouth every 6 (six) hours as needed for mild pain or headache (fever >/= 101). 30 tablet 0   amLODipine (NORVASC) 5 MG tablet Take 1 tablet (5 mg total) by mouth daily. 90 tablet 3   ascorbic acid (VITAMIN C) 500 MG tablet Take 1 tablet (500 mg total) by mouth daily. 10 tablet 0   aspirin 81 MG tablet Take 81 mg by mouth daily.     atorvastatin (LIPITOR)  40 MG tablet TAKE 1 TABLET BY MOUTH EVERYDAY AT BEDTIME 90 tablet 0   blood glucose meter kit and supplies KIT Dispense based on patient and insurance preference. Check fasting blood sugar each morning.  E11.65 1 each 0   Blood Glucose Monitoring Suppl (BLOOD GLUCOSE SYSTEM PAK) KIT Please dispense based on patient and insurance preference. Use as instructed to monitor FSBS 1x daily. Dx E11.9. 1 kit 1   Continuous Blood Gluc Sensor (FREESTYLE LIBRE 2 SENSOR) MISC Use as directed to monitor blood glucose. Dx: E11.9. 2 each 3   Dulaglutide (TRULICITY) 1.5 JE/5.6DJ SOPN INJECT 0.5MLS INTO THE SKIN EVERY SUNDAY 6 mL 1   empagliflozin (JARDIANCE) 25 MG TABS tablet Take 1 tablet (25 mg total) by mouth daily. 90 tablet 3   famotidine (PEPCID AC) 10 MG chewable tablet Chew 10 mg by mouth as needed for heartburn.     fluticasone (FLONASE) 50 MCG/ACT nasal spray Place 2 sprays into both nostrils daily. 48 g 11   glucose blood test strip Please dispense based on patient and insurance preference. Use as instructed to monitor FSBS 1x daily. Dx E11.9. 100 each 12   Insulin Pen Needle (NOVOFINE) 30G X 8 MM MISC Inject 10 each into the skin as needed (Daily while on lantus.). 90 each 0   lisinopril-hydrochlorothiazide (ZESTORETIC) 20-12.5  MG tablet Take 2 tablets by mouth daily. 180 tablet 1   loratadine (CLARITIN) 10 MG tablet Take 10 mg by mouth daily as needed for allergies.     metFORMIN (GLUCOPHAGE) 1000 MG tablet TAKE 1 TABLET BY MOUTH TWICE A DAY WITH A MEAL 180 tablet 1   Multiple Vitamins-Minerals (MULTIVITAMIN PO) Take 1 tablet by mouth daily.      nystatin (MYCOSTATIN) 100000 UNIT/ML suspension Take 5 mLs (500,000 Units total) by mouth 4 (four) times daily. 60 mL 0   OneTouch Delica Lancets 58K MISC USE AS DIRECTED EVERY DAY 100 each 1   pioglitazone (ACTOS) 30 MG tablet Take 1 tablet (30 mg total) by mouth daily. 90 tablet 3   sildenafil (VIAGRA) 100 MG tablet Take 0.5-1 tablets (50-100 mg total) by  mouth daily as needed for erectile dysfunction. 30 tablet 11   triamcinolone (KENALOG) 0.1 % paste Use as directed 1 application in the mouth or throat 2 (two) times daily. 20 g 2   zinc sulfate 220 (50 Zn) MG capsule Take 1 capsule (220 mg total) by mouth daily. 10 capsule 0   No current facility-administered medications on file prior to visit.    No Known Allergies Social History   Socioeconomic History   Marital status: Single    Spouse name: Not on file   Number of children: Not on file   Years of education: Not on file   Highest education level: Not on file  Occupational History   Not on file  Tobacco Use   Smoking status: Never   Smokeless tobacco: Never  Vaping Use   Vaping Use: Never used  Substance and Sexual Activity   Alcohol use: No   Drug use: No   Sexual activity: Yes    Comment: married to [place].  Three kids.  exercising daily.  Other Topics Concern   Not on file  Social History Narrative   Not on file   Social Determinants of Health   Financial Resource Strain: Not on file  Food Insecurity: Not on file  Transportation Needs: Not on file  Physical Activity: Not on file  Stress: Not on file  Social Connections: Not on file  Intimate Partner Violence: Not on file     Review of Systems  All other systems reviewed and are negative.     Objective:   Physical Exam Vitals reviewed.  Constitutional:      Appearance: He is well-developed.  HENT:     Right Ear: Tympanic membrane and ear canal normal.     Left Ear: Tympanic membrane and ear canal normal.     Nose:     Right Turbinates: Swollen.     Left Turbinates: Swollen.     Left Sinus: Frontal sinus tenderness present.     Mouth/Throat:     Mouth: Mucous membranes are moist. No injury, lacerations, oral lesions or angioedema.     Tongue: No lesions.     Pharynx: No oropharyngeal exudate or posterior oropharyngeal erythema.  Eyes:     Conjunctiva/sclera: Conjunctivae normal.     Pupils:  Pupils are equal, round, and reactive to light.  Cardiovascular:     Rate and Rhythm: Normal rate and regular rhythm.     Heart sounds: Normal heart sounds. No murmur heard.   No friction rub. No gallop.  Pulmonary:     Effort: Pulmonary effort is normal. No respiratory distress.     Breath sounds: Normal breath sounds. No stridor. No wheezing or rales.  Chest:     Chest wall: No tenderness.  Abdominal:     General: Bowel sounds are normal. There is no distension.     Palpations: Abdomen is soft. There is no mass.     Tenderness: There is no abdominal tenderness. There is no guarding or rebound.  Musculoskeletal:     Cervical back: Neck supple.  Lymphadenopathy:     Cervical: No cervical adenopathy.  Neurological:     Mental Status: He is alert and oriented to person, place, and time.     Cranial Nerves: No cranial nerve deficit.     Coordination: Coordination normal.     Deep Tendon Reflexes: Reflexes normal.        Assessment & Plan:  Laryngospasm  Acute frontal sinusitis, recurrence not specified Believe the patient likely had an upper respiratory infection that has lingered on now for more than a month and development of a secondary left frontal sinus infection.  I believe that he may also be having laryngospasms related to this and perhaps some wheezing.  So I will start the patient on tomorrow 1 inhalation daily despite bronchospasms and wheezing.  Begin Augmentin 875 mg p.o. twice daily for 10 days and then reassess in 1 week or sooner if worsening.  He is on an ACE inhibitor but I do not feel that the patient is experiencing angioedema as there is no tongue swelling or lip swelling visible today on exam there is no stridor Auscultate

## 2021-04-21 ENCOUNTER — Other Ambulatory Visit: Payer: Self-pay | Admitting: Family Medicine

## 2021-04-27 ENCOUNTER — Other Ambulatory Visit: Payer: Self-pay | Admitting: Nurse Practitioner

## 2021-04-27 DIAGNOSIS — J069 Acute upper respiratory infection, unspecified: Secondary | ICD-10-CM

## 2021-05-06 ENCOUNTER — Other Ambulatory Visit: Payer: Self-pay | Admitting: Family Medicine

## 2021-05-10 ENCOUNTER — Other Ambulatory Visit: Payer: Self-pay

## 2021-05-10 ENCOUNTER — Other Ambulatory Visit: Payer: Managed Care, Other (non HMO)

## 2021-05-10 DIAGNOSIS — E1100 Type 2 diabetes mellitus with hyperosmolarity without nonketotic hyperglycemic-hyperosmolar coma (NKHHC): Secondary | ICD-10-CM

## 2021-05-11 ENCOUNTER — Other Ambulatory Visit: Payer: Self-pay | Admitting: Family Medicine

## 2021-05-11 LAB — HEMOGLOBIN A1C
Hgb A1c MFr Bld: 7.9 % of total Hgb — ABNORMAL HIGH (ref <5.7)
Mean Plasma Glucose: 180 mg/dL
eAG (mmol/L): 10 mmol/L

## 2021-05-12 ENCOUNTER — Other Ambulatory Visit: Payer: Self-pay

## 2021-05-12 ENCOUNTER — Encounter: Payer: Self-pay | Admitting: Family Medicine

## 2021-05-12 ENCOUNTER — Ambulatory Visit (INDEPENDENT_AMBULATORY_CARE_PROVIDER_SITE_OTHER): Payer: Managed Care, Other (non HMO) | Admitting: Family Medicine

## 2021-05-12 VITALS — BP 142/82 | HR 90 | Temp 97.7°F | Resp 18 | Ht 69.0 in | Wt 232.0 lb

## 2021-05-12 DIAGNOSIS — E1100 Type 2 diabetes mellitus with hyperosmolarity without nonketotic hyperglycemic-hyperosmolar coma (NKHHC): Secondary | ICD-10-CM

## 2021-05-12 MED ORDER — GLIPIZIDE ER 5 MG PO TB24: 5.0000 mg | ORAL_TABLET | Freq: Every day | ORAL | 5 refills | Status: DC

## 2021-05-12 NOTE — Progress Notes (Signed)
Subjective:    Patient ID: Joseph [name], male    DOB: 1961-01-06, 61 y.o.   MRN: [id number]  HPI since his last visit, patient's A1c has fallen from 8.4-7.9.  He has lost 13 pounds.  He is taking metformin 1000 mg twice a day, Jardiance 25 mg a day, and Trulicity 1.5 subcu weekly.  However he still seeing blood sugars over 200.  He denies any hypoglycemic episodes.  Past Medical History:  Diagnosis Date   Allergy    Diabetes mellitus without complication ([place])    Hyperlipidemia    Hypertension    Past Surgical History:  Procedure Laterality Date   COLONOSCOPY  2015   INSERTION OF MESH N/A 10/31/2017   Procedure: INSERTION OF MESH;  Surgeon: Coralie Keens, MD;  Location: [facility];  Service: General;  Laterality: N/A;   UMBILICAL HERNIA REPAIR N/A 10/31/2017   Procedure: UMBILICAL HERNIA REPAIR WITH MESH;  Surgeon: Coralie Keens, MD;  Location: [facility];  Service: General;  Laterality: N/A;   VASECTOMY  2006   Current Outpatient Medications on File Prior to Visit  Medication Sig Dispense Refill   acetaminophen (TYLENOL) 325 MG tablet Take 2 tablets (650 mg total) by mouth every 6 (six) hours as needed for mild pain or headache (fever >/= 101). 30 tablet 0   albuterol (VENTOLIN HFA) 108 (90 Base) MCG/ACT inhaler TAKE 2 PUFFS BY MOUTH EVERY 6 HOURS AS NEEDED FOR WHEEZE OR SHORTNESS OF BREATH 8.5 each 1   amLODipine (NORVASC) 5 MG tablet Take 1 tablet (5 mg total) by mouth daily. 90 tablet 3   ascorbic acid (VITAMIN C) 500 MG tablet Take 1 tablet (500 mg total) by mouth daily. 10 tablet 0   aspirin 81 MG tablet Take 81 mg by mouth daily.     atorvastatin (LIPITOR) 40 MG tablet TAKE 1 TABLET BY MOUTH EVERYDAY AT BEDTIME 90 tablet 3   blood glucose meter kit and supplies KIT Dispense based on patient and insurance preference. Check fasting blood sugar each morning.  E11.65 1 each 0   Blood Glucose Monitoring Suppl (BLOOD GLUCOSE SYSTEM PAK) KIT Please dispense based on patient and insurance  preference. Use as instructed to monitor FSBS 1x daily. Dx E11.9. 1 kit 1   COMBIVENT RESPIMAT 20-100 MCG/ACT AERS respimat INHALE 2 PUFFS INTO THE LUNGS EVERY 6 HOURS AS NEEDED FOR WHEEZE 4 g 0   Continuous Blood Gluc Sensor (FREESTYLE LIBRE 2 SENSOR) MISC USE AS DIRECTED TO MONITOR BLOOD GLUCOSE. DX: E11.9. 1 each 3   Dulaglutide (TRULICITY) 1.5 WV/3.7TG SOPN INJECT 0.5MLS INTO THE SKIN EVERY SUNDAY 6 mL 1   empagliflozin (JARDIANCE) 25 MG TABS tablet Take 1 tablet (25 mg total) by mouth daily. 90 tablet 3   famotidine (PEPCID AC) 10 MG chewable tablet Chew 10 mg by mouth as needed for heartburn.     fluticasone (FLONASE) 50 MCG/ACT nasal spray Place 2 sprays into both nostrils daily. 48 g 11   glucose blood test strip Please dispense based on patient and insurance preference. Use as instructed to monitor FSBS 1x daily. Dx E11.9. 100 each 12   Insulin Pen Needle (NOVOFINE) 30G X 8 MM MISC Inject 10 each into the skin as needed (Daily while on lantus.). 90 each 0   lisinopril-hydrochlorothiazide (ZESTORETIC) 20-12.5 MG tablet Take 2 tablets by mouth daily. 180 tablet 1   loratadine (CLARITIN) 10 MG tablet Take 10 mg by mouth daily as needed for allergies.     metFORMIN (GLUCOPHAGE)  1000 MG tablet TAKE 1 TABLET BY MOUTH TWICE A DAY WITH A MEAL 180 tablet 1   Multiple Vitamins-Minerals (MULTIVITAMIN PO) Take 1 tablet by mouth daily.      nystatin (MYCOSTATIN) 100000 UNIT/ML suspension Take 5 mLs (500,000 Units total) by mouth 4 (four) times daily. 60 mL 0   OneTouch Delica Lancets 53G MISC USE AS DIRECTED EVERY DAY 100 each 1   sildenafil (VIAGRA) 100 MG tablet Take 0.5-1 tablets (50-100 mg total) by mouth daily as needed for erectile dysfunction. 30 tablet 11   triamcinolone (KENALOG) 0.1 % paste Use as directed 1 application in the mouth or throat 2 (two) times daily. 20 g 2   zinc sulfate 220 (50 Zn) MG capsule Take 1 capsule (220 mg total) by mouth daily. 10 capsule 0   No current  facility-administered medications on file prior to visit.    No Known Allergies Social History   Socioeconomic History   Marital status: Single    Spouse name: Not on file   Number of children: Not on file   Years of education: Not on file   Highest education level: Not on file  Occupational History   Not on file  Tobacco Use   Smoking status: Never   Smokeless tobacco: Never  Vaping Use   Vaping Use: Never used  Substance and Sexual Activity   Alcohol use: No   Drug use: No   Sexual activity: Yes    Comment: married to [place].  Three kids.  exercising daily.  Other Topics Concern   Not on file  Social History Narrative   Not on file   Social Determinants of Health   Financial Resource Strain: Not on file  Food Insecurity: Not on file  Transportation Needs: Not on file  Physical Activity: Not on file  Stress: Not on file  Social Connections: Not on file  Intimate Partner Violence: Not on file     Review of Systems  All other systems reviewed and are negative.     Objective:   Physical Exam Vitals reviewed.  Constitutional:      Appearance: He is well-developed.  HENT:     Right Ear: Tympanic membrane and ear canal normal.     Left Ear: Tympanic membrane and ear canal normal.     Nose:     Right Turbinates: Swollen.     Left Turbinates: Swollen.     Left Sinus: Frontal sinus tenderness present.     Mouth/Throat:     Mouth: Mucous membranes are moist. No injury, lacerations, oral lesions or angioedema.     Tongue: No lesions.     Pharynx: No oropharyngeal exudate or posterior oropharyngeal erythema.  Eyes:     Conjunctiva/sclera: Conjunctivae normal.     Pupils: Pupils are equal, round, and reactive to light.  Cardiovascular:     Rate and Rhythm: Normal rate and regular rhythm.     Heart sounds: Normal heart sounds. No murmur heard.   No friction rub. No gallop.  Pulmonary:     Effort: Pulmonary effort is normal. No respiratory distress.     Breath  sounds: Normal breath sounds. No stridor. No wheezing or rales.  Chest:     Chest wall: No tenderness.  Abdominal:     General: Bowel sounds are normal. There is no distension.     Palpations: Abdomen is soft. There is no mass.     Tenderness: There is no abdominal tenderness. There is no guarding or rebound.  Musculoskeletal:     Cervical back: Neck supple.  Lymphadenopathy:     Cervical: No cervical adenopathy.  Neurological:     Mental Status: He is alert and oriented to person, place, and time.     Cranial Nerves: No cranial nerve deficit.     Coordination: Coordination normal.     Deep Tendon Reflexes: Reflexes normal.        Assessment & Plan:  Type 2 diabetes mellitus with hyperosmolarity without coma, without long-term current use of insulin (HCC) Together we decided to add glipizide extended release 5 mg daily.  He will call me back in 1 month and give me an update on his sugars.  If still greater than 200 we will increase glipizide to 10 mg a day.  He will work on a [employer].

## 2021-05-28 ENCOUNTER — Other Ambulatory Visit: Payer: Self-pay | Admitting: Family Medicine

## 2021-05-28 DIAGNOSIS — J069 Acute upper respiratory infection, unspecified: Secondary | ICD-10-CM

## 2021-06-23 ENCOUNTER — Other Ambulatory Visit: Payer: Self-pay | Admitting: Family Medicine

## 2021-06-24 ENCOUNTER — Other Ambulatory Visit: Payer: Self-pay | Admitting: Family Medicine

## 2021-06-26 ENCOUNTER — Other Ambulatory Visit: Payer: Self-pay | Admitting: Family Medicine

## 2021-07-09 ENCOUNTER — Encounter: Payer: Self-pay | Admitting: Family Medicine

## 2021-07-12 ENCOUNTER — Other Ambulatory Visit: Payer: Self-pay | Admitting: Family Medicine

## 2021-07-28 ENCOUNTER — Other Ambulatory Visit: Payer: Self-pay

## 2021-07-28 ENCOUNTER — Other Ambulatory Visit: Payer: Self-pay | Admitting: Family Medicine

## 2021-07-28 ENCOUNTER — Encounter: Payer: Self-pay | Admitting: Family Medicine

## 2021-07-28 DIAGNOSIS — E1165 Type 2 diabetes mellitus with hyperglycemia: Secondary | ICD-10-CM

## 2021-07-28 NOTE — Telephone Encounter (Signed)
Pt aware and have seen msg from Dr. Dennard Schaumann via [place].  ?

## 2021-08-12 ENCOUNTER — Other Ambulatory Visit: Payer: Self-pay | Admitting: Family Medicine

## 2021-08-31 ENCOUNTER — Other Ambulatory Visit: Payer: Self-pay | Admitting: Family Medicine

## 2021-08-31 NOTE — Telephone Encounter (Signed)
Requested Prescriptions  ?Pending Prescriptions Disp Refills  ?? amLODipine (NORVASC) 5 MG tablet [Pharmacy Med Name: AMLODIPINE BESYLATE 5 MG TAB] 90 tablet 0  ?  Sig: TAKE 1 TABLET (5 MG TOTAL) BY MOUTH DAILY.  ?  ? Cardiovascular: Calcium Channel Blockers 2 Failed - 08/31/2021  8:51 AM  ?  ?  Failed - Last BP in normal range  ?  BP Readings from Last 1 Encounters:  ?05/12/21 (!) 142/82  ?   ?  ?  Passed - Last Heart Rate in normal range  ?  Pulse Readings from Last 1 Encounters:  ?05/12/21 90  ?   ?  ?  Passed - Valid encounter within last 6 months  ?  Recent Outpatient Visits   ?      ? 3 months ago Type 2 diabetes mellitus with hyperosmolarity without coma, without long-term current use of insulin ([place])  ? [facility] Pickard, Cammie Mcgee, MD  ? 4 months ago Laryngospasm  ? [facility] Pickard, Cammie Mcgee, MD  ? 7 months ago Encounter for immunization  ? [facility] Pickard, Cammie Mcgee, MD  ? 10 months ago Upper respiratory tract infection, unspecified type  ? [facility] Eulogio Bear, NP  ? 1 year ago Type 2 diabetes mellitus with hyperosmolarity without coma, without long-term current use of insulin ([place])  ? [facility] Pickard, Cammie Mcgee, MD  ?  ?  ? ?  ?  ?  ? ? ?

## 2021-09-22 ENCOUNTER — Other Ambulatory Visit: Payer: Self-pay | Admitting: Family Medicine

## 2021-09-23 NOTE — Telephone Encounter (Signed)
Requested medication (s) are due for refill today: yes  Requested medication (s) are on the active medication list: yes  Last refill:  01/27/21 #180 1 RF  Future visit scheduled: no  Notes to clinic:  overdue lab work   Requested Prescriptions  Pending Prescriptions Disp Refills   lisinopril-hydrochlorothiazide (ZESTORETIC) 20-12.5 MG tablet [Pharmacy Med Name: LISINOPRIL-HCTZ 20-12.5 MG TAB] 180 tablet 1    Sig: Take 2 tablets by mouth daily.     Cardiovascular:  ACEI + Diuretic Combos Failed - 09/22/2021  2:10 AM      Failed - Na in normal range and within 180 days    Sodium  Date Value Ref Range Status  01/20/2021 141 135 - 146 mmol/L Final         Failed - K in normal range and within 180 days    Potassium  Date Value Ref Range Status  01/20/2021 3.6 3.5 - 5.3 mmol/L Final         Failed - Cr in normal range and within 180 days    Creat  Date Value Ref Range Status  01/20/2021 1.03 0.70 - 1.30 mg/dL Final   Creatinine, Urine  Date Value Ref Range Status  01/20/2021 40 20 - 320 mg/dL Final         Failed - eGFR is 30 or above and within 180 days    GFR, Est African American  Date Value Ref Range Status  12/16/2019 86 > OR = 60 mL/min/1.73m2 Final   GFR, Est Non African American  Date Value Ref Range Status  12/16/2019 74 > OR = 60 mL/min/1.73m2 Final   eGFR  Date Value Ref Range Status  01/20/2021 84 > OR = 60 mL/min/1.73m2 Final    Comment:    The eGFR is based on the CKD-EPI 2021 equation. To calculate  the new eGFR from a previous Creatinine or Cystatin C result, go to https://www.kidney.org/professionals/ kdoqi/gfr%5Fcalculator          Failed - Last BP in normal range    BP Readings from Last 1 Encounters:  05/12/21 (!) 142/82         Passed - Patient is not pregnant      Passed - Valid encounter within last 6 months    Recent Outpatient Visits           4 months ago Type 2 diabetes mellitus with hyperosmolarity without coma, without  long-term current use of insulin (HCC)   [facility] Pickard, Warren T, MD   5 months ago Laryngospasm   [facility] Pickard, Warren T, MD   7 months ago Encounter for immunization   [facility] Pickard, Warren T, MD   11 months ago Upper respiratory tract infection, unspecified type   [facility] Martinez, Jessica A, NP   1 year ago Type 2 diabetes mellitus with hyperosmolarity without coma, without long-term current use of insulin (HCC)   [facility] Pickard, Warren T, MD                   

## 2021-10-05 IMAGING — US US SCROTUM W/ DOPPLER COMPLETE
1 series · 13 of 25 positions shown · non-contrast
Comparison: None

CLINICAL DATA: Scrotal mass on LEFT

EXAM:
SCROTAL ULTRASOUND
DOPPLER ULTRASOUND OF THE TESTICLES
TECHNIQUE: Complete ultrasound examination of the testicles, epididymis, and
other scrotal structures was performed. Color and spectral Doppler
ultrasound were also utilized to evaluate blood flow to the
testicles.

[Series 1: us scrotum w/ doppler complete · 0.06mm/px · 13 of 62 slices shown]
[im 1/62]
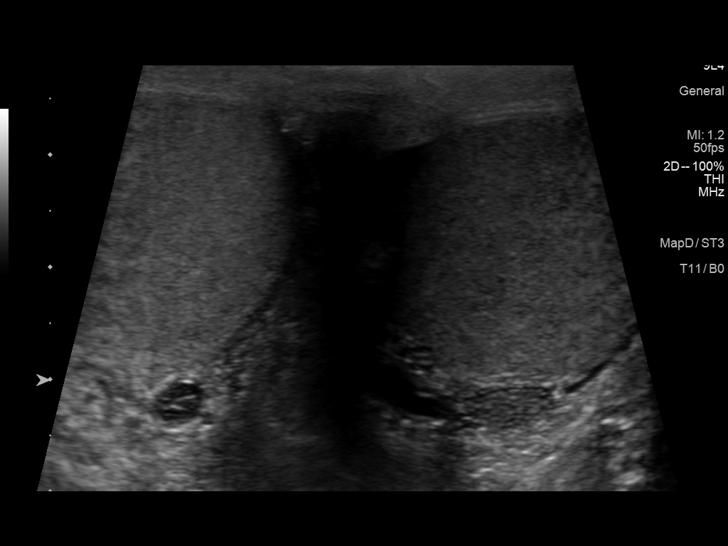
[im 6/62]
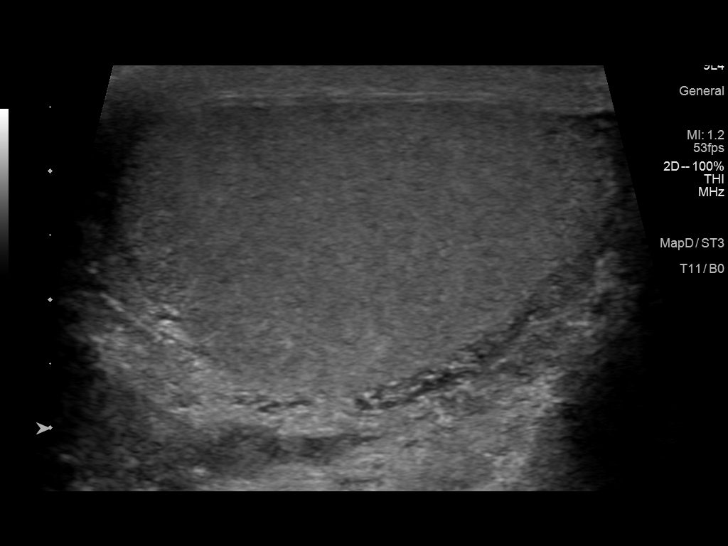
[im 11/62]
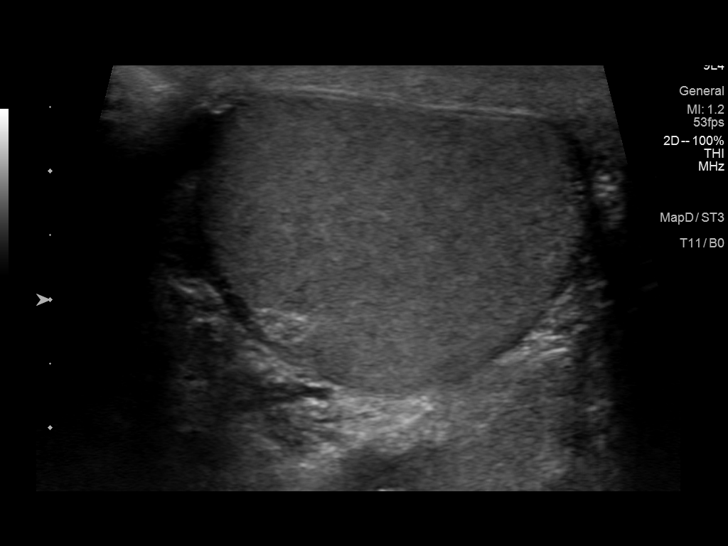
[im 16/62]
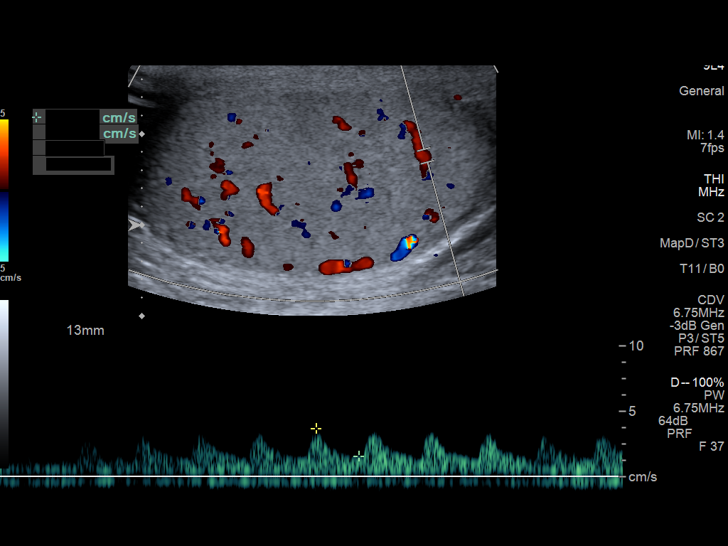
[im 21/62]
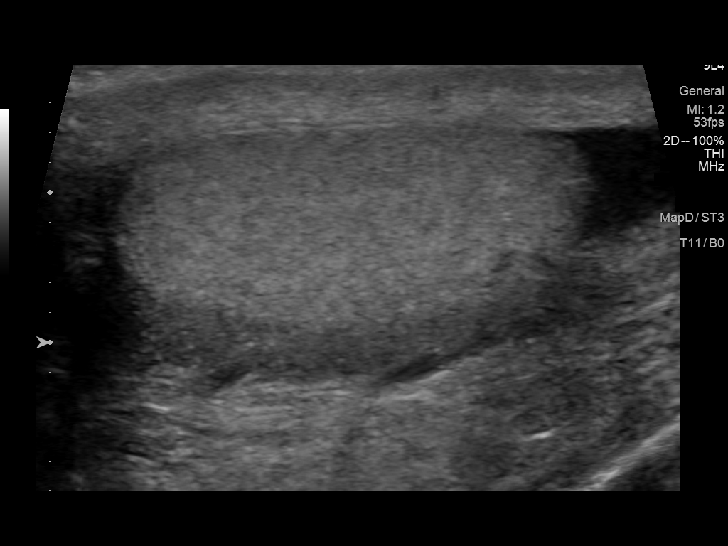
[im 26/62]
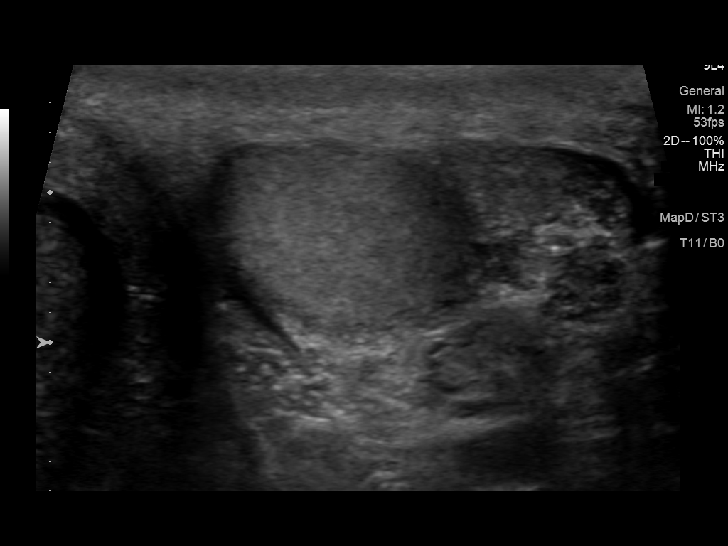
[im 31/62]
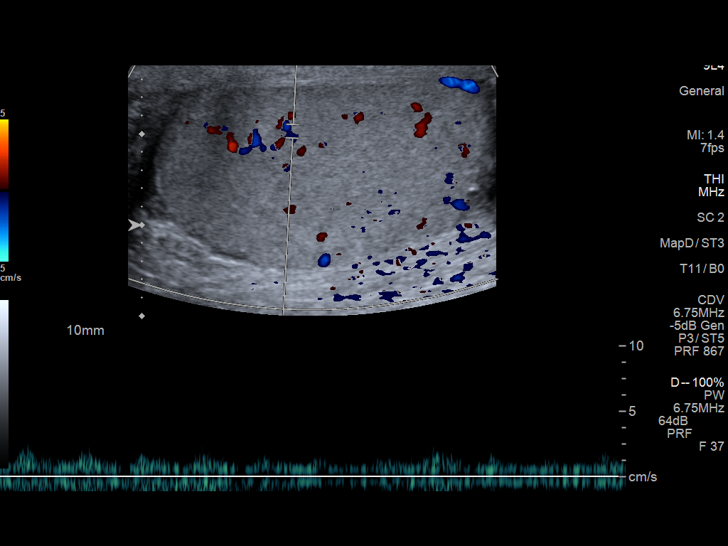
[im 36/62]
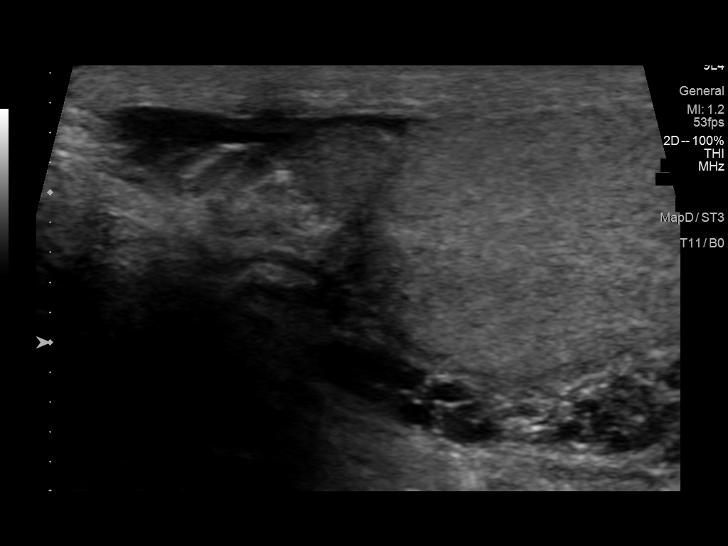
[im 41/62]
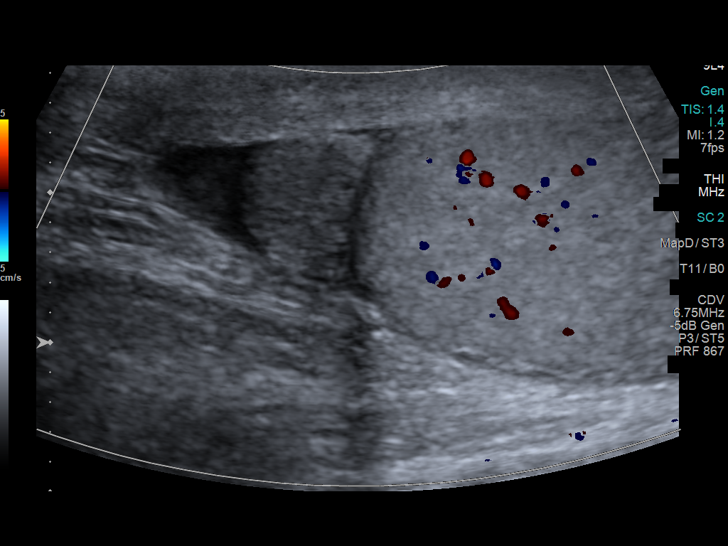
[im 46/62]
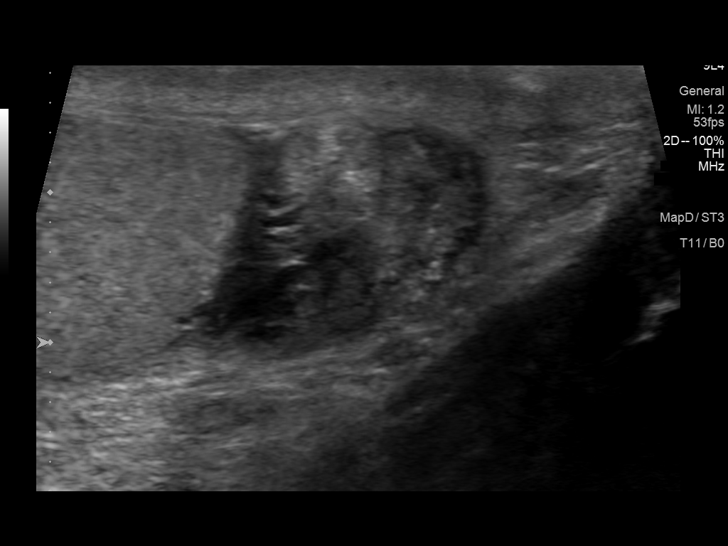
[im 51/62]
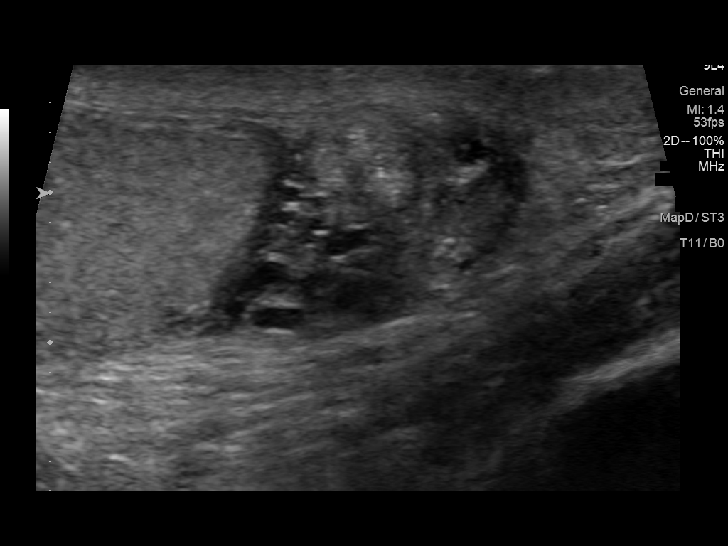
[im 56/62]
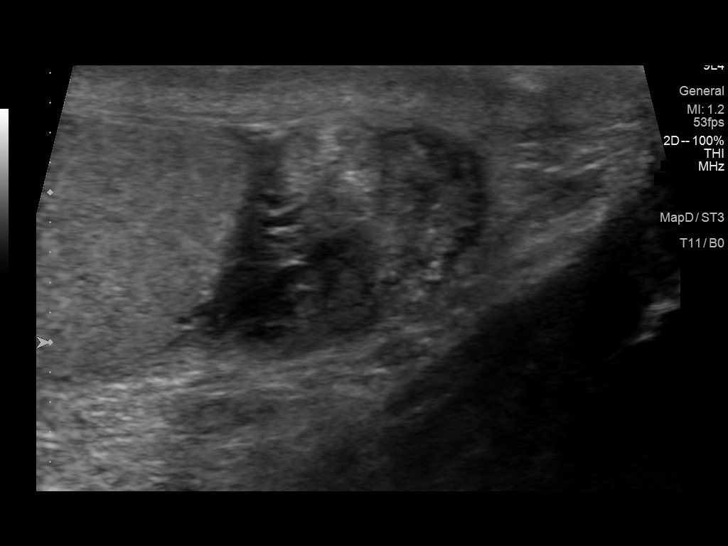
[im 62/62]
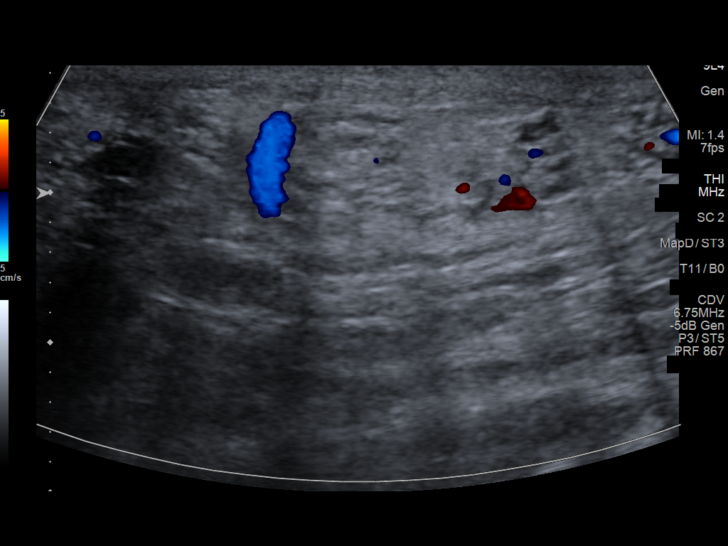

[13 of 25 positions shown; findings below may reference images not displayed]

FINDINGS: Right testicle

Measurements: 3.9 x 2.3 x 2.9 cm. Normal echogenicity without mass
or calcification. Internal blood flow present on color Doppler
imaging.

Left testicle

Measurements: 3.7 x 2.1 x 2.6 cm. Normal echogenicity without mass
or calcification. Internal blood flow present on color Doppler
imaging.

Right epididymis:  Normal in size and appearance.

Left epididymis: Complex heterogeneous isoechoic to mildly
hyperechoic mass at RIGHT epididymal tail, 8 x 6 x 9 mm, may either
represent sequela of epididymitis/trauma or a small tumor, most
common being a benign adenomatoid tumor.

Hydrocele:  None visualized.

Varicocele:  None visualized.

Pulsed Doppler interrogation of both testes demonstrates normal low
resistance arterial and venous waveforms bilaterally.
IMPRESSION: Normal appearing testes and RIGHT epididymis.

9 mm diameter complex heterogeneous isoechoic to mildly hyperechoic
mass at RIGHT epididymal tail, could either represent sequela of
prior epididymitis/trauma or a small epididymal tumor, most common
being a benign adenomatoid tumor.

## 2021-10-08 ENCOUNTER — Other Ambulatory Visit: Payer: Self-pay | Admitting: Family Medicine

## 2021-10-10 ENCOUNTER — Other Ambulatory Visit: Payer: Self-pay | Admitting: Family Medicine

## 2021-10-10 NOTE — Telephone Encounter (Signed)
Requested medication (s) are due for refill today: Yes  Requested medication (s) are on the active medication list: Yes  Last refill:  07/28/21  Future visit scheduled: No  Notes to clinic:  Historical provider.    Requested Prescriptions  Pending Prescriptions Disp Refills   glipiZIDE (GLUCOTROL XL) 10 MG 24 hr tablet [Pharmacy Med Name: GLIPIZIDE ER 10 MG TABLET] 90 tablet 1    Sig: TAKE 1 TABLET BY MOUTH EVERY DAY WITH BREAKFAST     Endocrinology:  Diabetes - Sulfonylureas Passed - 10/10/2021 11:34 AM      Passed - HBA1C is between 0 and 7.9 and within 180 days    Hgb A1C (fingerstick)  Date Value Ref Range Status  08/18/2015 11.7 (H) <5.7 % Final    Comment:                                                                           According to the ADA Clinical Practice Recommendations for 2011, when HbA1c is used as a screening test:     >=6.5%   Diagnostic of Diabetes Mellitus            (if abnormal result is confirmed)   5.7-6.4%   Increased risk of developing Diabetes Mellitus   References:Diagnosis and Classification of Diabetes Mellitus,Diabetes VEHM,0947,09(GGEZM 1):S62-S69 and Standards of Medical Care in         Diabetes - 2011,Diabetes OQHU,7654,65 (Suppl 1):S11-S61.      Hgb A1c MFr Bld  Date Value Ref Range Status  05/10/2021 7.9 (H) <5.7 % of total Hgb Final    Comment:    For someone without known diabetes, a hemoglobin A1c value of 6.5% or greater indicates that they may have  diabetes and this should be confirmed with a follow-up  test. . For someone with known diabetes, a value <7% indicates  that their diabetes is well controlled and a value  greater than or equal to 7% indicates suboptimal  control. A1c targets should be individualized based on  duration of diabetes, age, comorbid conditions, and  other considerations. . Currently, no consensus exists regarding use of hemoglobin A1c for diagnosis of diabetes for children. .           Passed - Cr in normal range and within 360 days    Creat  Date Value Ref Range Status  01/20/2021 1.03 0.70 - 1.30 mg/dL Final   Creatinine, Urine  Date Value Ref Range Status  01/20/2021 40 20 - 320 mg/dL Final         Passed - Valid encounter within last 6 months    Recent Outpatient Visits           5 months ago Type 2 diabetes mellitus with hyperosmolarity without coma, without long-term current use of insulin ([place])   [place] Pickard, Cammie Mcgee, MD   5 months ago Laryngospasm   [place] Pickard, Cammie Mcgee, MD   8 months ago Encounter for immunization   [place] Pickard, Cammie Mcgee, MD   1 year ago Upper respiratory tract infection, unspecified type   [facility] Eulogio Bear, NP   1 year ago Type 2  diabetes mellitus with hyperosmolarity without coma, without long-term current use of insulin ([place])   [place] Pickard, Cammie Mcgee, MD

## 2021-10-11 NOTE — Telephone Encounter (Signed)
Requested Prescriptions  Pending Prescriptions Disp Refills  . albuterol (VENTOLIN HFA) 108 (90 Base) MCG/ACT inhaler [Pharmacy Med Name: ALBUTEROL HFA (PROAIR) INHALER] 8.5 each 1    Sig: TAKE 2 PUFFS BY MOUTH EVERY 6 HOURS AS NEEDED FOR WHEEZE OR SHORTNESS OF BREATH     Pulmonology:  Beta Agonists 2 Failed - 10/11/2021  8:29 AM      Failed - Last BP in normal range    BP Readings from Last 1 Encounters:  05/12/21 (!) 142/82         Passed - Last Heart Rate in normal range    Pulse Readings from Last 1 Encounters:  05/12/21 90         Passed - Valid encounter within last 12 months    Recent Outpatient Visits          5 months ago Type 2 diabetes mellitus with hyperosmolarity without coma, without long-term current use of insulin ([place])   [place] Pickard, Cammie Mcgee, MD   6 months ago Laryngospasm   [place] Dennard Schaumann, Cammie Mcgee, MD   8 months ago Encounter for immunization   [place] Pickard, Cammie Mcgee, MD   1 year ago Upper respiratory tract infection, unspecified type   [place] Eulogio Bear, NP   1 year ago Type 2 diabetes mellitus with hyperosmolarity without coma, without long-term current use of insulin ([place])   [place] Pickard, Cammie Mcgee, MD

## 2021-11-03 ENCOUNTER — Other Ambulatory Visit: Payer: Self-pay | Admitting: Family Medicine

## 2021-11-03 NOTE — Telephone Encounter (Signed)
Med refill was denied due to pt is no longer on this dose

## 2021-11-25 ENCOUNTER — Other Ambulatory Visit: Payer: Self-pay | Admitting: Family Medicine

## 2021-11-25 NOTE — Telephone Encounter (Signed)
Requested Prescriptions  Pending Prescriptions Disp Refills  . amLODipine (NORVASC) 5 MG tablet [Pharmacy Med Name: AMLODIPINE BESYLATE 5 MG TAB] 60 tablet 0    Sig: TAKE 1 TABLET (5 MG TOTAL) BY MOUTH DAILY.     Cardiovascular: Calcium Channel Blockers 2 Failed - 11/25/2021  2:28 AM      Failed - Last BP in normal range    BP Readings from Last 1 Encounters:  05/12/21 (!) 142/82         Failed - Valid encounter within last 6 months    Recent Outpatient Visits          6 months ago Type 2 diabetes mellitus with hyperosmolarity without coma, without long-term current use of insulin ([place])   [place] Pickard, Cammie Mcgee, MD   7 months ago Laryngospasm   [place] Denn[facility] Schaumann, Cammie Mcgee, MD   10 months ago Encounter for immunization   [place] Pickard, Cammie Mcgee, MD   1 year ago Upper respiratory tract infection, unspecified type   [place] Eulogio Bear, NP   1 year ago Type 2 diabetes mellitus with hyperosmolarity without coma, without long-term current use of insulin ([place])   [place] Pickard, Cammie Mcgee, MD      Future Appointments            In 2 months Pickard, Cammie Mcgee, MD [place]           Passed - Last Heart Rate in normal range    Pulse Readings from Last 1 Encounters:  05/12/21 90

## 2021-12-19 ENCOUNTER — Other Ambulatory Visit: Payer: Self-pay | Admitting: Family Medicine

## 2021-12-20 ENCOUNTER — Other Ambulatory Visit: Payer: Self-pay | Admitting: Family Medicine

## 2021-12-29 ENCOUNTER — Other Ambulatory Visit: Payer: Self-pay | Admitting: Family Medicine

## 2021-12-30 NOTE — Telephone Encounter (Signed)
Requested Prescriptions  Pending Prescriptions Disp Refills  . TRULICITY 1.5 VO/5.9YT SOPN [Pharmacy Med Name: TRULICITY 1.5 WK/4.6 ML PEN] 6 mL 0    Sig: INJECT 0.5MLS INTO THE SKIN EVERY SUNDAY     Endocrinology:  Diabetes - GLP-1 Receptor Agonists Failed - 12/29/2021  7:51 AM      Failed - HBA1C is between 0 and 7.9 and within 180 days    Hgb A1C (fingerstick)  Date Value Ref Range Status  08/18/2015 11.7 (H) <5.7 % Final    Comment:                                                                           According to the ADA Clinical Practice Recommendations for 2011, when HbA1c is used as a screening test:     >=6.5%   Diagnostic of Diabetes Mellitus            (if abnormal result is confirmed)   5.7-6.4%   Increased risk of developing Diabetes Mellitus   References:Diagnosis and Classification of Diabetes Mellitus,Diabetes KMMN,8177,11(AFBXU 1):S62-S69 and Standards of Medical Care in         Diabetes - 2011,Diabetes XYBF,3832,91 (Suppl 1):S11-S61.      Hgb A1c MFr Bld  Date Value Ref Range Status  05/10/2021 7.9 (H) <5.7 % of total Hgb Final    Comment:    For someone without known diabetes, a hemoglobin A1c value of 6.5% or greater indicates that they may have  diabetes and this should be confirmed with a follow-up  test. . For someone with known diabetes, a value <7% indicates  that their diabetes is well controlled and a value  greater than or equal to 7% indicates suboptimal  control. A1c targets should be individualized based on  duration of diabetes, age, comorbid conditions, and  other considerations. . Currently, no consensus exists regarding use of hemoglobin A1c for diagnosis of diabetes for children. .          Failed - Valid encounter within last 6 months    Recent Outpatient Visits          7 months ago Type 2 diabetes mellitus with hyperosmolarity without coma, without long-term current use of insulin ([place])   [place]  Pickard, Cammie Mcgee, MD   8 months ago Laryngospasm   [place] [facility] Schaumann, Cammie Mcgee, MD   11 months ago Encounter for immunization   [place] Pickard, Cammie Mcgee, MD   1 year ago Upper respiratory tract infection, unspecified type   [place] Eulogio Bear, NP   1 year ago Type 2 diabetes mellitus with hyperosmolarity without coma, without long-term current use of insulin ([place])   [place] Pickard, Cammie Mcgee, MD      Future Appointments            In 1 month Pickard, Cammie Mcgee, MD [place]

## 2022-01-05 LAB — HM DIABETES EYE EXAM

## 2022-01-06 ENCOUNTER — Encounter: Payer: Self-pay | Admitting: Family Medicine

## 2022-01-15 ENCOUNTER — Other Ambulatory Visit: Payer: Self-pay | Admitting: Family Medicine

## 2022-01-16 NOTE — Telephone Encounter (Signed)
last RF 12/21/21 #30 1 RF-- should have enough to last until 02/20/22  Requested Prescriptions  Refused Prescriptions Disp Refills  . amLODipine (NORVASC) 5 MG tablet [Pharmacy Med Name: AMLODIPINE BESYLATE 5 MG TAB] 30 tablet 1    Sig: TAKE 1 TABLET (5 MG TOTAL) BY MOUTH DAILY.     Cardiovascular: Calcium Channel Blockers 2 Failed - 01/15/2022 12:31 PM      Failed - Last BP in normal range    BP Readings from Last 1 Encounters:  05/12/21 (!) 142/82         Failed - Valid encounter within last 6 months    Recent Outpatient Visits          8 months ago Type 2 diabetes mellitus with hyperosmolarity without coma, without long-term current use of insulin ([place])   [place] Pickard, Cammie Mcgee, MD   9 months ago Laryngospasm   [place] Dennard Schaumann, Cammie Mcgee, MD   11 months ago Encounter for immunization   [place] Pickard, Cammie Mcgee, MD   1 year ago Upper respiratory tract infection, unspecified type   [place] Eulogio Bear, NP   1 year ago Type 2 diabetes mellitus with hyperosmolarity without coma, without long-term current use of insulin ([place])   [place] Pickard, Cammie Mcgee, MD      Future Appointments            In 3 weeks Pickard, Cammie Mcgee, MD [place]           Passed - Last Heart Rate in normal range    Pulse Readings from Last 1 Encounters:  05/12/21 90

## 2022-01-29 ENCOUNTER — Other Ambulatory Visit: Payer: Self-pay | Admitting: Family Medicine

## 2022-01-29 DIAGNOSIS — E1165 Type 2 diabetes mellitus with hyperglycemia: Secondary | ICD-10-CM

## 2022-02-02 ENCOUNTER — Other Ambulatory Visit: Payer: Managed Care, Other (non HMO)

## 2022-02-02 DIAGNOSIS — I152 Hypertension secondary to endocrine disorders: Secondary | ICD-10-CM

## 2022-02-02 DIAGNOSIS — Z125 Encounter for screening for malignant neoplasm of prostate: Secondary | ICD-10-CM

## 2022-02-02 DIAGNOSIS — R7989 Other specified abnormal findings of blood chemistry: Secondary | ICD-10-CM

## 2022-02-02 DIAGNOSIS — E876 Hypokalemia: Secondary | ICD-10-CM

## 2022-02-02 DIAGNOSIS — E1169 Type 2 diabetes mellitus with other specified complication: Secondary | ICD-10-CM

## 2022-02-02 DIAGNOSIS — E1100 Type 2 diabetes mellitus with hyperosmolarity without nonketotic hyperglycemic-hyperosmolar coma (NKHHC): Secondary | ICD-10-CM

## 2022-02-03 LAB — COMPREHENSIVE METABOLIC PANEL
AG Ratio: 1.6 (calc) (ref 1.0–2.5)
ALT: 53 U/L — ABNORMAL HIGH (ref 9–46)
AST: 29 U/L (ref 10–35)
Albumin: 4.3 g/dL (ref 3.6–5.1)
Alkaline phosphatase (APISO): 62 U/L (ref 35–144)
BUN: 21 mg/dL (ref 7–25)
CO2: 24 mmol/L (ref 20–32)
Calcium: 9.2 mg/dL (ref 8.6–10.3)
Chloride: 101 mmol/L (ref 98–110)
Creat: 1.08 mg/dL (ref 0.70–1.30)
Globulin: 2.7 g/dL (calc) (ref 1.9–3.7)
Glucose, Bld: 159 mg/dL — ABNORMAL HIGH (ref 65–99)
Potassium: 3.6 mmol/L (ref 3.5–5.3)
Sodium: 139 mmol/L (ref 135–146)
Total Bilirubin: 0.6 mg/dL (ref 0.2–1.2)
Total Protein: 7 g/dL (ref 6.1–8.1)

## 2022-02-03 LAB — CBC WITH DIFFERENTIAL/PLATELET
Absolute Monocytes: 1029 cells/uL — ABNORMAL HIGH (ref 200–950)
Basophils Absolute: 42 cells/uL (ref 0–200)
Basophils Relative: 0.5 %
Eosinophils Absolute: 133 cells/uL (ref 15–500)
Eosinophils Relative: 1.6 %
HCT: 49.5 % (ref 38.5–50.0)
Hemoglobin: 17 g/dL (ref 13.2–17.1)
Lymphs Abs: 1685 cells/uL (ref 850–3900)
MCH: 28.8 pg (ref 27.0–33.0)
MCHC: 34.3 g/dL (ref 32.0–36.0)
MCV: 83.8 fL (ref 80.0–100.0)
MPV: 11.2 fL (ref 7.5–12.5)
Monocytes Relative: 12.4 %
Neutro Abs: 5412 cells/uL (ref 1500–7800)
Neutrophils Relative %: 65.2 %
Platelets: 230 10*3/uL (ref 140–400)
RBC: 5.91 10*6/uL — ABNORMAL HIGH (ref 4.20–5.80)
RDW: 14.4 % (ref 11.0–15.0)
Total Lymphocyte: 20.3 %
WBC: 8.3 10*3/uL (ref 3.8–10.8)

## 2022-02-03 LAB — LIPID PANEL
Cholesterol: 142 mg/dL (ref <200)
HDL: 38 mg/dL — ABNORMAL LOW (ref >=40)
LDL Cholesterol (Calc): 77 mg/dL (calc)
Non-HDL Cholesterol (Calc): 104 mg/dL (calc) (ref <130)
Total CHOL/HDL Ratio: 3.7 (calc) (ref <5.0)
Triglycerides: 176 mg/dL — ABNORMAL HIGH (ref <150)

## 2022-02-03 LAB — PSA: PSA: 1.04 ng/mL (ref <=4.00)

## 2022-02-03 LAB — HEMOGLOBIN A1C
Hgb A1c MFr Bld: 8.3 % of total Hgb — ABNORMAL HIGH (ref <5.7)
Mean Plasma Glucose: 192 mg/dL
eAG (mmol/L): 10.6 mmol/L

## 2022-02-09 ENCOUNTER — Ambulatory Visit: Payer: Managed Care, Other (non HMO) | Admitting: Family Medicine

## 2022-02-09 ENCOUNTER — Other Ambulatory Visit: Payer: Self-pay | Admitting: Family Medicine

## 2022-02-09 VITALS — BP 136/88 | HR 82 | Ht 69.0 in | Wt 234.0 lb

## 2022-02-09 DIAGNOSIS — E785 Hyperlipidemia, unspecified: Secondary | ICD-10-CM

## 2022-02-09 DIAGNOSIS — E1100 Type 2 diabetes mellitus with hyperosmolarity without nonketotic hyperglycemic-hyperosmolar coma (NKHHC): Secondary | ICD-10-CM | POA: Diagnosis not present

## 2022-02-09 DIAGNOSIS — Z23 Encounter for immunization: Secondary | ICD-10-CM | POA: Diagnosis not present

## 2022-02-09 DIAGNOSIS — I10 Essential (primary) hypertension: Secondary | ICD-10-CM

## 2022-02-09 DIAGNOSIS — Z0000 Encounter for general adult medical examination without abnormal findings: Secondary | ICD-10-CM | POA: Diagnosis not present

## 2022-02-09 MED ORDER — POLYMYXIN B-TRIMETHOPRIM 10000-0.1 UNIT/ML-% OP SOLN: 2.0000 [drp] | OPHTHALMIC | 0 refills | Status: DC

## 2022-02-09 MED ORDER — LANTUS SOLOSTAR 100 UNIT/ML ~~LOC~~ SOPN: 10.0000 [IU] | PEN_INJECTOR | Freq: Every day | SUBCUTANEOUS | 99 refills | Status: DC

## 2022-02-09 MED ORDER — OMEPRAZOLE 40 MG PO CPDR: 40.0000 mg | DELAYED_RELEASE_CAPSULE | Freq: Every day | ORAL | 3 refills | Status: DC

## 2022-02-09 MED ORDER — AMLODIPINE BESYLATE 10 MG PO TABS: 10.0000 mg | ORAL_TABLET | Freq: Every day | ORAL | 3 refills | Status: DC

## 2022-02-09 NOTE — Addendum Note (Signed)
Addended by: Randal Buba K on: 02/09/2022 09:34 AM   Modules accepted: Orders

## 2022-02-09 NOTE — Progress Notes (Signed)
Subjective:    Patient ID: Joseph [name], male    DOB: 03-Nov-1960, 61 y.o.   MRN: [id number]  Patient is here today for complete physical exam.  His left eyelid is also swollen in the lower and upper eyelid with 2 styes adjacent to 1 another.  He has been like this for a few days.  They are tender and painful.  His A1c is elevated.  Please see the lab work below Lab on 02/02/2022  Component Date Value Ref Range Status   WBC 02/02/2022 8.3  3.8 - 10.8 Thousand/uL Final   RBC 02/02/2022 5.91 (H)  4.20 - 5.80 Million/uL Final   Hemoglobin 02/02/2022 17.0  13.2 - 17.1 g/dL Final   HCT 02/02/2022 49.5  38.5 - 50.0 % Final   MCV 02/02/2022 83.8  80.0 - 100.0 fL Final   MCH 02/02/2022 28.8  27.0 - 33.0 pg Final   MCHC 02/02/2022 34.3  32.0 - 36.0 g/dL Final   RDW 02/02/2022 14.4  11.0 - 15.0 % Final   Platelets 02/02/2022 230  140 - 400 Thousand/uL Final   MPV 02/02/2022 11.2  7.5 - 12.5 fL Final   Neutro Abs 02/02/2022 5,412  1,500 - 7,800 cells/uL Final   Lymphs Abs 02/02/2022 1,685  850 - 3,900 cells/uL Final   Absolute Monocytes 02/02/2022 1,029 (H)  200 - 950 cells/uL Final   Eosinophils Absolute 02/02/2022 133  15 - 500 cells/uL Final   Basophils Absolute 02/02/2022 42  0 - 200 cells/uL Final   Neutrophils Relative % 02/02/2022 65.2  % Final   Total Lymphocyte 02/02/2022 20.3  % Final   Monocytes Relative 02/02/2022 12.4  % Final   Eosinophils Relative 02/02/2022 1.6  % Final   Basophils Relative 02/02/2022 0.5  % Final   Glucose, Bld 02/02/2022 159 (H)  65 - 99 mg/dL Final   Comment: .            Fasting reference interval . For someone without known diabetes, a glucose value >125 mg/dL indicates that they may have diabetes and this should be confirmed with a follow-up test. .    BUN 02/02/2022 21  7 - 25 mg/dL Final   Creat 02/02/2022 1.08  0.70 - 1.30 mg/dL Final   BUN/Creatinine Ratio 02/02/2022 SEE NOTE:  6 - 22 (calc) Final   Comment:    Not Reported: BUN and Creatinine  are within    reference range. .    Sodium 02/02/2022 139  135 - 146 mmol/L Final   Potassium 02/02/2022 3.6  3.5 - 5.3 mmol/L Final   Chloride 02/02/2022 101  98 - 110 mmol/L Final   CO2 02/02/2022 24  20 - 32 mmol/L Final   Calcium 02/02/2022 9.2  8.6 - 10.3 mg/dL Final   Total Protein 02/02/2022 7.0  6.1 - 8.1 g/dL Final   Albumin 02/02/2022 4.3  3.6 - 5.1 g/dL Final   Globulin 02/02/2022 2.7  1.9 - 3.7 g/dL (calc) Final   AG Ratio 02/02/2022 1.6  1.0 - 2.5 (calc) Final   Total Bilirubin 02/02/2022 0.6  0.2 - 1.2 mg/dL Final   Alkaline phosphatase (APISO) 02/02/2022 62  35 - 144 U/L Final   AST 02/02/2022 29  10 - 35 U/L Final   ALT 02/02/2022 53 (H)  9 - 46 U/L Final   PSA 02/02/2022 1.04  < OR = 4.00 ng/mL Final   Comment: The total PSA value from this assay system is  standardized against the [facility]  standard. The test  result will be approximately 20% lower when compared  to the equimolar-standardized total PSA (Beckman  Coulter). Comparison of serial PSA results should be  interpreted with this fact in mind. . This test was performed using the Siemens  chemiluminescent method. Values obtained from  different assay methods cannot be used interchangeably. PSA levels, regardless of value, should not be interpreted as absolute evidence of the presence or absence of disease.    Cholesterol 02/02/2022 142  <200 mg/dL Final   HDL 02/02/2022 38 (L)  > OR = 40 mg/dL Final   Triglycerides 02/02/2022 176 (H)  <150 mg/dL Final   LDL Cholesterol (Calc) 02/02/2022 77  mg/dL (calc) Final   Comment: Reference range: <100 . Desirable range <100 mg/dL for primary prevention;   <70 mg/dL for patients with CHD or diabetic patients  with > or = 2 CHD risk factors. Marland Kitchen LDL-C is now calculated using the Martin-Hopkins  calculation, which is a validated novel method providing  better accuracy than the Friedewald equation in the  estimation of LDL-C.  Cresenciano Genre et al. Annamaria Helling. 4650;354(65):  2061-2068  (http://education.[facility].com/faq/FAQ164)    Total CHOL/HDL Ratio 02/02/2022 3.7  <5.0 (calc) Final   Non-HDL Cholesterol (Calc) 02/02/2022 104  <130 mg/dL (calc) Final   Comment: For patients with diabetes plus 1 major ASCVD risk  factor, treating to a non-HDL-C goal of <100 mg/dL  (LDL-C of <70 mg/dL) is considered a therapeutic  option.    Hgb A1c MFr Bld 02/02/2022 8.3 (H)  <5.7 % of total Hgb Final   Comment: For someone without known diabetes, a hemoglobin A1c value of 6.5% or greater indicates that they may have  diabetes and this should be confirmed with a follow-up  test. . For someone with known diabetes, a value <7% indicates  that their diabetes is well controlled and a value  greater than or equal to 7% indicates suboptimal  control. A1c targets should be individualized based on  duration of diabetes, age, comorbid conditions, and  other considerations. . Currently, no consensus exists regarding use of hemoglobin A1c for diagnosis of diabetes for children. .    Mean Plasma Glucose 02/02/2022 192  mg/dL Final   eAG (mmol/L) 02/02/2022 10.6  mmol/L Final   He also reports breakthrough acid reflux despite taking Prilosec 20 mg a day.  He also states that his blood pressures at home have been 140-150/60-80 Immunization History  Administered Date(s) Administered   Influenza,inj,Quad PF,6+ Mos 01/17/2013, 03/06/2014, 03/17/2016, 03/07/2017, 01/18/2018, 01/31/2019, 03/04/2020, 01/27/2021   Pneumococcal Polysaccharide-23 10/19/2017   Td 02/08/2004   Tdap 01/06/2011   Lab on 02/02/2022  Component Date Value Ref Range Status   WBC 02/02/2022 8.3  3.8 - 10.8 Thousand/uL Final   RBC 02/02/2022 5.91 (H)  4.20 - 5.80 Million/uL Final   Hemoglobin 02/02/2022 17.0  13.2 - 17.1 g/dL Final   HCT 02/02/2022 49.5  38.5 - 50.0 % Final   MCV 02/02/2022 83.8  80.0 - 100.0 fL Final   MCH 02/02/2022 28.8  27.0 - 33.0 pg Final   MCHC 02/02/2022 34.3  32.0 - 36.0  g/dL Final   RDW 02/02/2022 14.4  11.0 - 15.0 % Final   Platelets 02/02/2022 230  140 - 400 Thousand/uL Final   MPV 02/02/2022 11.2  7.5 - 12.5 fL Final   Neutro Abs 02/02/2022 5,412  1,500 - 7,800 cells/uL Final   Lymphs Abs 02/02/2022 1,685  850 - 3,900 cells/uL Final   Absolute Monocytes  02/02/2022 1,029 (H)  200 - 950 cells/uL Final   Eosinophils Absolute 02/02/2022 133  15 - 500 cells/uL Final   Basophils Absolute 02/02/2022 42  0 - 200 cells/uL Final   Neutrophils Relative % 02/02/2022 65.2  % Final   Total Lymphocyte 02/02/2022 20.3  % Final   Monocytes Relative 02/02/2022 12.4  % Final   Eosinophils Relative 02/02/2022 1.6  % Final   Basophils Relative 02/02/2022 0.5  % Final   Glucose, Bld 02/02/2022 159 (H)  65 - 99 mg/dL Final   Comment: .            Fasting reference interval . For someone without known diabetes, a glucose value >125 mg/dL indicates that they may have diabetes and this should be confirmed with a follow-up test. .    BUN 02/02/2022 21  7 - 25 mg/dL Final   Creat 02/02/2022 1.08  0.70 - 1.30 mg/dL Final   BUN/Creatinine Ratio 02/02/2022 SEE NOTE:  6 - 22 (calc) Final   Comment:    Not Reported: BUN and Creatinine are within    reference range. .    Sodium 02/02/2022 139  135 - 146 mmol/L Final   Potassium 02/02/2022 3.6  3.5 - 5.3 mmol/L Final   Chloride 02/02/2022 101  98 - 110 mmol/L Final   CO2 02/02/2022 24  20 - 32 mmol/L Final   Calcium 02/02/2022 9.2  8.6 - 10.3 mg/dL Final   Total Protein 02/02/2022 7.0  6.1 - 8.1 g/dL Final   Albumin 02/02/2022 4.3  3.6 - 5.1 g/dL Final   Globulin 02/02/2022 2.7  1.9 - 3.7 g/dL (calc) Final   AG Ratio 02/02/2022 1.6  1.0 - 2.5 (calc) Final   Total Bilirubin 02/02/2022 0.6  0.2 - 1.2 mg/dL Final   Alkaline phosphatase (APISO) 02/02/2022 62  35 - 144 U/L Final   AST 02/02/2022 29  10 - 35 U/L Final   ALT 02/02/2022 53 (H)  9 - 46 U/L Final   PSA 02/02/2022 1.04  < OR = 4.00 ng/mL Final   Comment: The  total PSA value from this assay system is  standardized against the WHO standard. The test  result will be approximately 20% lower when compared  to the equimolar-standardized total PSA (Beckman  Coulter). Comparison of serial PSA results should be  interpreted with this fact in mind. . This test was performed using the Siemens  chemiluminescent method. Values obtained from  different assay methods cannot be used interchangeably. PSA levels, regardless of value, should not be interpreted as absolute evidence of the presence or absence of disease.    Cholesterol 02/02/2022 142  <200 mg/dL Final   HDL 02/02/2022 38 (L)  > OR = 40 mg/dL Final   Triglycerides 02/02/2022 176 (H)  <150 mg/dL Final   LDL Cholesterol (Calc) 02/02/2022 77  mg/dL (calc) Final   Comment: Reference range: <100 . Desirable range <100 mg/dL for primary prevention;   <70 mg/dL for patients with CHD or diabetic patients  with > or = 2 CHD risk factors. Marland Kitchen LDL-C is now calculated using the Martin-Hopkins  calculation, which is a validated novel method providing  better accuracy than the Friedewald equation in the  estimation of LDL-C.  Cresenciano Genre et al. Annamaria Helling. 0981;191(47): 2061-2068  (http://education.[facility].com/faq/FAQ164)    Total CHOL/HDL Ratio 02/02/2022 3.7  <5.0 (calc) Final   Non-HDL Cholesterol (Calc) 02/02/2022 104  <130 mg/dL (calc) Final   Comment: For patients with diabetes plus 1  major ASCVD risk  factor, treating to a non-HDL-C goal of <100 mg/dL  (LDL-C of <70 mg/dL) is considered a therapeutic  option.    Hgb A1c MFr Bld 02/02/2022 8.3 (H)  <5.7 % of total Hgb Final   Comment: For someone without known diabetes, a hemoglobin A1c value of 6.5% or greater indicates that they may have  diabetes and this should be confirmed with a follow-up  test. . For someone with known diabetes, a value <7% indicates  that their diabetes is well controlled and a value  greater than or equal to 7%  indicates suboptimal  control. A1c targets should be individualized based on  duration of diabetes, age, comorbid conditions, and  other considerations. . Currently, no consensus exists regarding use of hemoglobin A1c for diagnosis of diabetes for children. .    Mean Plasma Glucose 02/02/2022 192  mg/dL Final   eAG (mmol/L) 02/02/2022 10.6  mmol/L Final    Past Medical History:  Diagnosis Date   Allergy    Diabetes mellitus without complication ([place])    Hyperlipidemia    Hypertension    Past Surgical History:  Procedure Laterality Date   COLONOSCOPY  2015   INSERTION OF MESH N/A 10/31/2017   Procedure: INSERTION OF MESH;  Surgeon: Coralie Keens, MD;  Location: [facility];  Service: General;  Laterality: N/A;   UMBILICAL HERNIA REPAIR N/A 10/31/2017   Procedure: UMBILICAL HERNIA REPAIR WITH MESH;  Surgeon: Coralie Keens, MD;  Location: [facility];  Service: General;  Laterality: N/A;   VASECTOMY  2006   Current Outpatient Medications on File Prior to Visit  Medication Sig Dispense Refill   albuterol (VENTOLIN HFA) 108 (90 Base) MCG/ACT inhaler TAKE 2 PUFFS BY MOUTH EVERY 6 HOURS AS NEEDED FOR WHEEZE OR SHORTNESS OF BREATH 8.5 each 1   ascorbic acid (VITAMIN C) 500 MG tablet Take 1 tablet (500 mg total) by mouth daily. 10 tablet 0   aspirin 81 MG tablet Take 81 mg by mouth daily.     atorvastatin (LIPITOR) 40 MG tablet TAKE 1 TABLET BY MOUTH EVERYDAY AT BEDTIME 90 tablet 3   blood glucose meter kit and supplies KIT Dispense based on patient and insurance preference. Check fasting blood sugar each morning.  E11.65 1 each 0   Blood Glucose Monitoring Suppl (BLOOD GLUCOSE SYSTEM PAK) KIT Please dispense based on patient and insurance preference. Use as instructed to monitor FSBS 1x daily. Dx E11.9. 1 kit 1   COMBIVENT RESPIMAT 20-100 MCG/ACT AERS respimat INHALE 2 PUFFS INTO THE LUNGS EVERY 6 HOURS AS NEEDED FOR WHEEZE 4 g 1   Continuous Blood Gluc Sensor (FREESTYLE LIBRE 2 SENSOR) MISC  USE AS DIRECTED TO MONITOR BLOOD GLUCOSE. DX: E11.9. 2 each 11   famotidine (PEPCID AC) 10 MG chewable tablet Chew 10 mg by mouth as needed for heartburn.     fluticasone (FLONASE) 50 MCG/ACT nasal spray Place 2 sprays into both nostrils daily. 48 g 11   glipiZIDE (GLUCOTROL XL) 10 MG 24 hr tablet TAKE 1 TABLET BY MOUTH EVERY DAY WITH BREAKFAST 90 tablet 1   Insulin Pen Needle (NOVOFINE) 30G X 8 MM MISC Inject 10 each into the skin as needed (Daily while on lantus.). 90 each 0   JARDIANCE 25 MG TABS tablet TAKE 1 TABLET (25 MG TOTAL) BY MOUTH DAILY. 90 tablet 3   lisinopril-hydrochlorothiazide (ZESTORETIC) 20-12.5 MG tablet TAKE 2 TABLETS BY MOUTH DAILY 180 tablet 1   loratadine (CLARITIN) 10 MG tablet Take  10 mg by mouth daily as needed for allergies.     metFORMIN (GLUCOPHAGE) 1000 MG tablet TAKE 1 TABLET BY MOUTH TWICE A DAY WITH MEALS 90 tablet 0   Multiple Vitamins-Minerals (MULTIVITAMIN PO) Take 1 tablet by mouth daily.      nystatin (MYCOSTATIN) 100000 UNIT/ML suspension Take 5 mLs (500,000 Units total) by mouth 4 (four) times daily. 60 mL 0   OneTouch Delica Lancets 94H MISC USE AS DIRECTED EVERY DAY 100 each 1   ONETOUCH ULTRA test strip TEST EVERY DAY AS DIRECTED 100 strip 12   sildenafil (VIAGRA) 100 MG tablet Take 0.5-1 tablets (50-100 mg total) by mouth daily as needed for erectile dysfunction. 30 tablet 11   triamcinolone (KENALOG) 0.1 % paste Use as directed 1 application in the mouth or throat 2 (two) times daily. 20 g 2   TRULICITY 1.5 WT/8.8EK SOPN INJECT 0.5MLS INTO THE SKIN EVERY SUNDAY 6 mL 0   zinc sulfate 220 (50 Zn) MG capsule Take 1 capsule (220 mg total) by mouth daily. 10 capsule 0   acetaminophen (TYLENOL) 325 MG tablet Take 2 tablets (650 mg total) by mouth every 6 (six) hours as needed for mild pain or headache (fever >/= 101). (Patient not taking: Reported on 02/09/2022) 30 tablet 0   No current facility-administered medications on file prior to visit.   No Known  Allergies Social History   Socioeconomic History   Marital status: Single    Spouse name: Not on file   Number of children: Not on file   Years of education: Not on file   Highest education level: Not on file  Occupational History   Not on file  Tobacco Use   Smoking status: Never   Smokeless tobacco: Never  Vaping Use   Vaping Use: Never used  Substance and Sexual Activity   Alcohol use: No   Drug use: No   Sexual activity: Yes    Comment: married to Kep'el.  Three kids.  exercising daily.  Other Topics Concern   Not on file  Social History Narrative   Not on file   Social Determinants of Health   Financial Resource Strain: Not on file  Food Insecurity: Not on file  Transportation Needs: Not on file  Physical Activity: Not on file  Stress: Not on file  Social Connections: Not on file  Intimate Partner Violence: Not on file   Family History  Problem Relation Age of Onset   Heart disease Father    Stroke Father    Hypertension Sister    Heart disease Mother    Colon cancer Neg Hx       Review of Systems  Respiratory:  Negative for apnea, cough, choking, chest tightness, shortness of breath and wheezing.   Cardiovascular:  Negative for chest pain, palpitations and leg swelling.  Gastrointestinal:  Negative for abdominal distention, abdominal pain and diarrhea.  Genitourinary:  Negative for difficulty urinating, dysuria, frequency and hematuria.  All other systems reviewed and are negative.      Objective:   Physical Exam Vitals reviewed.  Constitutional:      Appearance: He is well-developed. He is not diaphoretic.  HENT:     Head: Normocephalic and atraumatic.     Right Ear: External ear normal.     Left Ear: External ear normal.     Nose: Nose normal.     Mouth/Throat:     Pharynx: No oropharyngeal exudate.  Eyes:     General: No scleral icterus.  Right eye: No discharge.        Left eye: Hordeolum present.No discharge.      Conjunctiva/sclera: Conjunctivae normal.     Pupils: Pupils are equal, round, and reactive to light.   Neck:     Thyroid: No thyromegaly.     Vascular: No JVD.     Trachea: No tracheal deviation.  Cardiovascular:     Rate and Rhythm: Normal rate and regular rhythm.     Heart sounds: Normal heart sounds. No murmur heard.    No friction rub. No gallop.  Pulmonary:     Effort: Pulmonary effort is normal. No respiratory distress.     Breath sounds: Normal breath sounds. No stridor. No wheezing or rales.  Chest:     Chest wall: No tenderness.  Abdominal:     General: Bowel sounds are normal. There is no distension.     Palpations: Abdomen is soft. There is no mass.     Tenderness: There is no abdominal tenderness. There is no guarding or rebound.  Musculoskeletal:        General: No tenderness. Normal range of motion.     Cervical back: Normal range of motion and neck supple.  Lymphadenopathy:     Cervical: No cervical adenopathy.  Skin:    General: Skin is warm.     Coloration: Skin is not pale.     Findings: No erythema or rash.  Neurological:     Mental Status: He is alert and oriented to person, place, and time.     Cranial Nerves: No cranial nerve deficit.     Motor: No abnormal muscle tone.     Coordination: Coordination normal.     Deep Tendon Reflexes: Reflexes are normal and symmetric.  Psychiatric:        Behavior: Behavior normal.        Thought Content: Thought content normal.        Judgment: Judgment normal.           Assessment & Plan:  Routine general medical examination at a health care facility  Type 2 diabetes mellitus with hyperosmolarity without coma, without long-term current use of insulin (HCC)  Essential hypertension  Hyperlipidemia, unspecified hyperlipidemia type Diabetes is poorly controlled.  We decided to start Lantus 10 units subcu daily today.  I recommended he increase insulin 1 units subcu daily until fasting blood sugars are less  than 130.  I asked him to give me weekly updates through his electronic medical record to help titrate his insulin to goal.  Blood pressure is also elevated so I increased amlodipine to 10 mg a day.  I will treat the stye in his left eye with Polytrim 2 drops 4 times a day for 1 week.  Patient received his flu shot today.  Recommended COVID vaccination.  The remainder of his preventative care is up-to-date

## 2022-02-09 NOTE — Telephone Encounter (Signed)
Requested medication (s) are due for refill today: alternative is requested  Requested medication (s) are on the active medication list: {yes  Last refill:  02/09/22  Future visit scheduled: no  Notes to clinic:  Unable to refill per protocol, pharmacy request:[facility] [place]     Requested Prescriptions  Pending Prescriptions Disp Refills   SEMGLEE, YFGN, 100 UNIT/ML Pen [Pharmacy Med Name: SEMGLEE (YFGN) 100 UNIT/ML PEN]  0     Off-Protocol Failed - 02/09/2022  9:22 AM      Failed - Medication not assigned to a protocol, review manually.      Passed - Valid encounter within last 12 months    Recent Outpatient Visits           9 months ago Type 2 diabetes mellitus with hyperosmolarity without coma, without long-term current use of insulin ([place])   [place] Pickard, Cammie Mcgee, MD   10 months ago Laryngospasm   [place] Dennard Schaumann, Cammie Mcgee, MD   1 year ago Encounter for immunization   [place] Pickard, Cammie Mcgee, MD   1 year ago Upper respiratory tract infection, unspecified type   [place] Eulogio Bear, NP   1 year ago Type 2 diabetes mellitus with hyperosmolarity without coma, without long-term current use of insulin ([place])   [facility] Pickard, Cammie Mcgee, MD

## 2022-02-14 ENCOUNTER — Other Ambulatory Visit: Payer: Self-pay | Admitting: Family Medicine

## 2022-02-14 ENCOUNTER — Encounter: Payer: Self-pay | Admitting: Family Medicine

## 2022-02-14 MED ORDER — BASAGLAR KWIKPEN 100 UNIT/ML ~~LOC~~ SOPN: 10.0000 [IU] | PEN_INJECTOR | Freq: Every day | SUBCUTANEOUS | 3 refills | Status: DC

## 2022-02-16 ENCOUNTER — Telehealth: Payer: Self-pay

## 2022-02-16 ENCOUNTER — Other Ambulatory Visit: Payer: Self-pay | Admitting: Family Medicine

## 2022-02-16 MED ORDER — INSULIN DETEMIR 100 UNIT/ML ~~LOC~~ SOLN: 10.0000 [IU] | Freq: Every day | SUBCUTANEOUS | 11 refills | Status: DC

## 2022-02-16 NOTE — Telephone Encounter (Signed)
Pt called and states her insurance will not cover [place]. Pt states his insurance will cover:  Levemir  Semglee  Toujea max solo star  [place]    Pt also states that his left eye is still swollen and puffy and itchy. He asks if there is anything else can be prescribed to help?

## 2022-02-17 ENCOUNTER — Other Ambulatory Visit: Payer: Self-pay | Admitting: Family Medicine

## 2022-02-17 MED ORDER — BLEPHAMIDE S.O.P. 10-0.2 % OP OINT: 1.0000 | TOPICAL_OINTMENT | Freq: Four times a day (QID) | OPHTHALMIC | 0 refills | Status: DC

## 2022-02-21 ENCOUNTER — Other Ambulatory Visit: Payer: Self-pay | Admitting: Family Medicine

## 2022-02-21 MED ORDER — TOUJEO MAX SOLOSTAR 300 UNIT/ML ~~LOC~~ SOPN: 10.0000 [IU] | PEN_INJECTOR | Freq: Every day | SUBCUTANEOUS | 3 refills | Status: DC

## 2022-02-25 ENCOUNTER — Other Ambulatory Visit: Payer: Self-pay | Admitting: Family Medicine

## 2022-02-27 ENCOUNTER — Other Ambulatory Visit: Payer: Self-pay | Admitting: Family Medicine

## 2022-02-27 DIAGNOSIS — E1165 Type 2 diabetes mellitus with hyperglycemia: Secondary | ICD-10-CM

## 2022-02-27 NOTE — Telephone Encounter (Signed)
Requested Prescriptions  Pending Prescriptions Disp Refills  . TRULICITY 1.5 YY/5.1TM SOPN [Pharmacy Med Name: TRULICITY 1.5 YT/1.1 ML PEN] 6 mL 0    Sig: INJECT 0.5MLS INTO THE SKIN EVERY SUNDAY     Endocrinology:  Diabetes - GLP-1 Receptor Agonists Failed - 02/25/2022  9:28 AM      Failed - HBA1C is between 0 and 7.9 and within 180 days    Hgb A1C (fingerstick)  Date Value Ref Range Status  08/18/2015 11.7 (H) <5.7 % Final    Comment:                                                                           According to the ADA Clinical Practice Recommendations for 2011, when HbA1c is used as a screening test:     >=6.5%   Diagnostic of Diabetes Mellitus            (if abnormal result is confirmed)   5.7-6.4%   Increased risk of developing Diabetes Mellitus   References:Diagnosis and Classification of Diabetes Mellitus,Diabetes NBVA,7014,10(VUDTH 1):S62-S69 and Standards of Medical Care in         Diabetes - 2011,Diabetes Care,2011,34 (Suppl 1):S11-S61.      Hgb A1c MFr Bld  Date Value Ref Range Status  02/02/2022 8.3 (H) <5.7 % of total Hgb Final    Comment:    For someone without known diabetes, a hemoglobin A1c value of 6.5% or greater indicates that they may have  diabetes and this should be confirmed with a follow-up  test. . For someone with known diabetes, a value <7% indicates  that their diabetes is well controlled and a value  greater than or equal to 7% indicates suboptimal  control. A1c targets should be individualized based on  duration of diabetes, age, comorbid conditions, and  other considerations. . Currently, no consensus exists regarding use of hemoglobin A1c for diagnosis of diabetes for children. .          Failed - Valid encounter within last 6 months    Recent Outpatient Visits          9 months ago Type 2 diabetes mellitus with hyperosmolarity without coma, without long-term current use of insulin ([place])   [place]  Pickard, Cammie Mcgee, MD   10 months ago Laryngospasm   [place] Dennard Schaumann, Cammie Mcgee, MD   1 year ago Encounter for immunization   [place] Pickard, Cammie Mcgee, MD   1 year ago Upper respiratory tract infection, unspecified type   [place] Eulogio Bear, NP   1 year ago Type 2 diabetes mellitus with hyperosmolarity without coma, without long-term current use of insulin ([place])   [facility] Pickard, Cammie Mcgee, MD

## 2022-03-02 ENCOUNTER — Other Ambulatory Visit: Payer: Self-pay

## 2022-03-02 DIAGNOSIS — E1100 Type 2 diabetes mellitus with hyperosmolarity without nonketotic hyperglycemic-hyperosmolar coma (NKHHC): Secondary | ICD-10-CM

## 2022-03-02 MED ORDER — INSULIN STARTER KIT- PEN NEEDLES (ENGLISH): 1.0000 | Freq: Once | Status: AC

## 2022-03-03 ENCOUNTER — Other Ambulatory Visit: Payer: Self-pay | Admitting: Family Medicine

## 2022-03-03 DIAGNOSIS — E1165 Type 2 diabetes mellitus with hyperglycemia: Secondary | ICD-10-CM

## 2022-03-20 ENCOUNTER — Other Ambulatory Visit: Payer: Self-pay | Admitting: Family Medicine

## 2022-04-08 ENCOUNTER — Other Ambulatory Visit: Payer: Self-pay | Admitting: Family Medicine

## 2022-04-16 ENCOUNTER — Other Ambulatory Visit: Payer: Self-pay | Admitting: Family Medicine

## 2022-04-17 NOTE — Telephone Encounter (Signed)
Requested medication (s) are due for refill today - yes  Requested medication (s) are on the active medication list -yes  Future visit scheduled -no  Last refill: 04/21/21 #90 3RF  Notes to clinic: Visit and labs up to date- no notes to continue medications  Requested Prescriptions  Pending Prescriptions Disp Refills   atorvastatin (LIPITOR) 40 MG tablet [Pharmacy Med Name: ATORVASTATIN 40 MG TABLET] 90 tablet 3    Sig: TAKE 1 TABLET BY MOUTH EVERYDAY AT BEDTIME     Cardiovascular:  Antilipid - Statins Failed - 04/16/2022  9:01 AM      Failed - Lipid Panel in normal range within the last 12 months    Cholesterol  Date Value Ref Range Status  02/02/2022 142 <200 mg/dL Final   LDL Cholesterol (Calc)  Date Value Ref Range Status  02/02/2022 77 mg/dL (calc) Final    Comment:    Reference range: <100 . Desirable range <100 mg/dL for primary prevention;   <70 mg/dL for patients with CHD or diabetic patients  with > or = 2 CHD risk factors. Marland Kitchen LDL-C is now calculated using the Martin-Hopkins  calculation, which is a validated novel method providing  better accuracy than the Friedewald equation in the  estimation of LDL-C.  Cresenciano Genre et al. Annamaria Helling. 4008;676(19): 2061-2068  (http://education.[facility].com/faq/FAQ164)    HDL  Date Value Ref Range Status  02/02/2022 38 (L) > OR = 40 mg/dL Final   Triglycerides  Date Value Ref Range Status  02/02/2022 176 (H) <150 mg/dL Final         Passed - Patient is not pregnant      Passed - Valid encounter within last 12 months    Recent Outpatient Visits           11 months ago Type 2 diabetes mellitus with hyperosmolarity without coma, without long-term current use of insulin ([place])   [place] Pickard, Cammie Mcgee, MD   1 year ago Laryngospasm   [place] Pickard, Cammie Mcgee, MD   1 year ago Encounter for immunization   [place] Pickard, Cammie Mcgee, MD   1 year ago Upper  respiratory tract infection, unspecified type   [place] Eulogio Bear, NP   1 year ago Type 2 diabetes mellitus with hyperosmolarity without coma, without long-term current use of insulin ([place])   [place] Pickard, Cammie Mcgee, MD                 Requested Prescriptions  Pending Prescriptions Disp Refills   atorvastatin (LIPITOR) 40 MG tablet [Pharmacy Med Name: ATORVASTATIN 40 MG TABLET] 90 tablet 3    Sig: TAKE 1 TABLET BY MOUTH EVERYDAY AT BEDTIME     Cardiovascular:  Antilipid - Statins Failed - 04/16/2022  9:01 AM      Failed - Lipid Panel in normal range within the last 12 months    Cholesterol  Date Value Ref Range Status  02/02/2022 142 <200 mg/dL Final   LDL Cholesterol (Calc)  Date Value Ref Range Status  02/02/2022 77 mg/dL (calc) Final    Comment:    Reference range: <100 . Desirable range <100 mg/dL for primary prevention;   <70 mg/dL for patients with CHD or diabetic patients  with > or = 2 CHD risk factors. Marland Kitchen LDL-C is now calculated using the Martin-Hopkins  calculation, which is a validated novel method providing  better accuracy than the Friedewald equation in  the  estimation of LDL-C.  Cresenciano Genre et al. Annamaria Helling. 5825;189(84): 2061-2068  (http://education.[facility].com/faq/FAQ164)    HDL  Date Value Ref Range Status  02/02/2022 38 (L) > OR = 40 mg/dL Final   Triglycerides  Date Value Ref Range Status  02/02/2022 176 (H) <150 mg/dL Final         Passed - Patient is not pregnant      Passed - Valid encounter within last 12 months    Recent Outpatient Visits           11 months ago Type 2 diabetes mellitus with hyperosmolarity without coma, without long-term current use of insulin ([place])   [place] Pickard, Cammie Mcgee, MD   1 year ago Laryngospasm   [place] Pickard, Cammie Mcgee, MD   1 year ago Encounter for immunization   [place]  Pickard, Cammie Mcgee, MD   1 year ago Upper respiratory tract infection, unspecified type   [place] Eulogio Bear, NP   1 year ago Type 2 diabetes mellitus with hyperosmolarity without coma, without long-term current use of insulin ([place])   [facility] Pickard, Cammie Mcgee, MD

## 2022-04-18 ENCOUNTER — Encounter: Payer: Self-pay | Admitting: Family Medicine

## 2022-04-25 ENCOUNTER — Other Ambulatory Visit: Payer: Self-pay

## 2022-04-25 DIAGNOSIS — E1100 Type 2 diabetes mellitus with hyperosmolarity without nonketotic hyperglycemic-hyperosmolar coma (NKHHC): Secondary | ICD-10-CM

## 2022-04-25 MED ORDER — TRULICITY 1.5 MG/0.5ML ~~LOC~~ SOAJ: SUBCUTANEOUS | 3 refills | Status: DC

## 2022-04-27 ENCOUNTER — Other Ambulatory Visit: Payer: Self-pay | Admitting: Family Medicine

## 2022-04-27 MED ORDER — TOUJEO MAX SOLOSTAR 300 UNIT/ML ~~LOC~~ SOPN: 36.0000 [IU] | PEN_INJECTOR | Freq: Every day | SUBCUTANEOUS | 3 refills | Status: DC

## 2022-05-06 ENCOUNTER — Other Ambulatory Visit: Payer: Self-pay | Admitting: Family Medicine

## 2022-05-07 ENCOUNTER — Other Ambulatory Visit: Payer: Self-pay | Admitting: Family Medicine

## 2022-06-01 ENCOUNTER — Encounter: Payer: Self-pay | Admitting: Family Medicine

## 2022-06-01 ENCOUNTER — Ambulatory Visit (INDEPENDENT_AMBULATORY_CARE_PROVIDER_SITE_OTHER): Payer: 59 | Admitting: Family Medicine

## 2022-06-01 VITALS — BP 132/78 | HR 93 | Temp 98.7°F | Ht 70.0 in | Wt 234.0 lb

## 2022-06-01 DIAGNOSIS — H01002 Unspecified blepharitis right lower eyelid: Secondary | ICD-10-CM

## 2022-06-01 DIAGNOSIS — H01005 Unspecified blepharitis left lower eyelid: Secondary | ICD-10-CM | POA: Diagnosis not present

## 2022-06-01 MED ORDER — ERYTHROMYCIN 5 MG/GM OP OINT: 1.0000 | TOPICAL_OINTMENT | Freq: Every day | OPHTHALMIC | 2 refills | Status: DC

## 2022-06-01 MED ORDER — DOXYCYCLINE HYCLATE 100 MG PO TABS: 100.0000 mg | ORAL_TABLET | Freq: Two times a day (BID) | ORAL | 0 refills | Status: DC

## 2022-06-01 NOTE — Progress Notes (Signed)
Subjective:    Patient ID: Joseph [name], male    DOB: 1961-02-24, 62 y.o.   MRN: [id number]   Patient has severe blepharitis.  His right lower eyelid is erythematous sore and swollen starting at the tear duct.  His left lower eyelid is also slightly erythematous and swollen. Past Medical History:  Diagnosis Date   Allergy    Diabetes mellitus without complication ([place])    Hyperlipidemia    Hypertension    Past Surgical History:  Procedure Laterality Date   COLONOSCOPY  2015   INSERTION OF MESH N/A 10/31/2017   Procedure: INSERTION OF MESH;  Surgeon: Coralie Keens, MD;  Location: [facility];  Service: General;  Laterality: N/A;   UMBILICAL HERNIA REPAIR N/A 10/31/2017   Procedure: UMBILICAL HERNIA REPAIR WITH MESH;  Surgeon: Coralie Keens, MD;  Location: [facility];  Service: General;  Laterality: N/A;   VASECTOMY  2006   Current Outpatient Medications on File Prior to Visit  Medication Sig Dispense Refill   albuterol (VENTOLIN HFA) 108 (90 Base) MCG/ACT inhaler TAKE 2 PUFFS BY MOUTH EVERY 6 HOURS AS NEEDED FOR WHEEZE OR SHORTNESS OF BREATH 8.5 each 1   amLODipine (NORVASC) 10 MG tablet Take 1 tablet (10 mg total) by mouth daily. 90 tablet 3   ascorbic acid (VITAMIN C) 500 MG tablet Take 1 tablet (500 mg total) by mouth daily. 10 tablet 0   aspirin 81 MG tablet Take 81 mg by mouth daily.     atorvastatin (LIPITOR) 40 MG tablet TAKE 1 TABLET BY MOUTH EVERYDAY AT BEDTIME 90 tablet 3   blood glucose meter kit and supplies KIT Dispense based on patient and insurance preference. Check fasting blood sugar each morning.  E11.65 1 each 0   Blood Glucose Monitoring Suppl (BLOOD GLUCOSE SYSTEM PAK) KIT Please dispense based on patient and insurance preference. Use as instructed to monitor FSBS 1x daily. Dx E11.9. 1 kit 1   COMBIVENT RESPIMAT 20-100 MCG/ACT AERS respimat INHALE 2 PUFFS INTO THE LUNGS EVERY 6 HOURS AS NEEDED FOR WHEEZE 4 g 1   Continuous Blood Gluc Sensor (FREESTYLE LIBRE 2 SENSOR)  MISC USE AS DIRECTED TO MONITOR BLOOD GLUCOSE. DX: E11.9. 2 each 11   Dulaglutide (TRULICITY) 1.5 OA/4.1YS SOPN Inject 0.5 MLS into the skin every Sunday. 6 mL 3   famotidine (PEPCID AC) 10 MG chewable tablet Chew 10 mg by mouth as needed for heartburn.     fluticasone (FLONASE) 50 MCG/ACT nasal spray Place 2 sprays into both nostrils daily. 48 g 11   glipiZIDE (GLUCOTROL XL) 10 MG 24 hr tablet TAKE 1 TABLET BY MOUTH EVERY DAY WITH BREAKFAST 90 tablet 1   insulin glargine, 2 Unit Dial, (TOUJEO MAX SOLOSTAR) 300 UNIT/ML Solostar Pen INJECT 36 UNITS INTO THE SKIN DAILY. 6 mL 3   Insulin Pen Needle (NOVOFINE) 30G X 8 MM MISC Inject 10 each into the skin as needed (Daily while on lantus.). 90 each 0   JARDIANCE 25 MG TABS tablet TAKE 1 TABLET (25 MG TOTAL) BY MOUTH DAILY. 90 tablet 3   lisinopril-hydrochlorothiazide (ZESTORETIC) 20-12.5 MG tablet TAKE 2 TABLETS BY MOUTH DAILY 180 tablet 1   loratadine (CLARITIN) 10 MG tablet Take 10 mg by mouth daily as needed for allergies.     metFORMIN (GLUCOPHAGE) 1000 MG tablet TAKE 1 TABLET BY MOUTH TWICE A DAY WITH FOOD 180 tablet 1   Multiple Vitamins-Minerals (MULTIVITAMIN PO) Take 1 tablet by mouth daily.      nystatin (MYCOSTATIN)  100000 UNIT/ML suspension Take 5 mLs (500,000 Units total) by mouth 4 (four) times daily. 60 mL 0   omeprazole (PRILOSEC) 40 MG capsule TAKE 1 CAPSULE (40 MG TOTAL) BY MOUTH DAILY. 90 capsule 1   OneTouch Delica Lancets 02R MISC USE AS DIRECTED EVERY DAY 100 each 1   ONETOUCH ULTRA test strip TEST EVERY DAY AS DIRECTED 100 strip 12   sildenafil (VIAGRA) 100 MG tablet Take 0.5-1 tablets (50-100 mg total) by mouth daily as needed for erectile dysfunction. 30 tablet 11   triamcinolone (KENALOG) 0.1 % paste Use as directed 1 application in the mouth or throat 2 (two) times daily. 20 g 2   trimethoprim-polymyxin b (POLYTRIM) ophthalmic solution Place 2 drops into the left eye every 4 (four) hours. 10 mL 0   zinc sulfate 220 (50 Zn) MG  capsule Take 1 capsule (220 mg total) by mouth daily. 10 capsule 0   Current Facility-Administered Medications on File Prior to Visit  Medication Dose Route Frequency Provider Last Rate Last Admin   insulin starter kit- pen needles (English) 1 kit  1 kit Other Once Susy Frizzle, MD        No Known Allergies Social History   Socioeconomic History   Marital status: Single    Spouse name: Not on file   Number of children: Not on file   Years of education: Not on file   Highest education level: Not on file  Occupational History   Not on file  Tobacco Use   Smoking status: Never   Smokeless tobacco: Never  Vaping Use   Vaping Use: Never used  Substance and Sexual Activity   Alcohol use: No   Drug use: No   Sexual activity: Yes    Comment: married to Oliver.  Three kids.  exercising daily.  Other Topics Concern   Not on file  Social History Narrative   Not on file   Social Determinants of Health   Financial Resource Strain: Not on file  Food Insecurity: Not on file  Transportation Needs: Not on file  Physical Activity: Not on file  Stress: Not on file  Social Connections: Not on file  Intimate Partner Violence: Not on file     Review of Systems  All other systems reviewed and are negative.      Objective:   Physical Exam Vitals reviewed.  Constitutional:      Appearance: He is well-developed.  HENT:     Nose:     Right Turbinates: Swollen.     Left Turbinates: Swollen.     Left Sinus: Frontal sinus tenderness present.     Mouth/Throat:     Mouth: Mucous membranes are moist. No injury, lacerations, oral lesions or angioedema.     Tongue: No lesions.     Pharynx: No oropharyngeal exudate or posterior oropharyngeal erythema.  Eyes:     General:        Right eye: Discharge and hordeolum present.        Left eye: Discharge present.    Conjunctiva/sclera: Conjunctivae normal.     Pupils: Pupils are equal, round, and reactive to light.   Cardiovascular:      Rate and Rhythm: Normal rate and regular rhythm.     Heart sounds: Normal heart sounds. No murmur heard.    No friction rub. No gallop.  Pulmonary:     Effort: Pulmonary effort is normal. No respiratory distress.     Breath sounds: Normal breath sounds. No stridor.  No wheezing or rales.  Chest:     Chest wall: No tenderness.  Abdominal:     General: Bowel sounds are normal. There is no distension.     Palpations: Abdomen is soft. There is no mass.     Tenderness: There is no abdominal tenderness. There is no guarding or rebound.  Musculoskeletal:     Cervical back: Neck supple.  Lymphadenopathy:     Cervical: No cervical adenopathy.  Neurological:     Mental Status: He is alert and oriented to person, place, and time.     Cranial Nerves: No cranial nerve deficit.     Coordination: Coordination normal.     Deep Tendon Reflexes: Reflexes normal.         Assessment & Plan:  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, unspecified type Based on severity in the right eye, use doxycycline 100 mg twice daily for 7 days.  Can use erythromycin ointment 1/2 inch ribbon daily in the left eye.  Discussed the cause of blepharitis.  Recommended daily lid scrubs using baby shampoo to help prevent this in the future.

## 2022-06-09 ENCOUNTER — Other Ambulatory Visit: Payer: Self-pay

## 2022-06-09 ENCOUNTER — Other Ambulatory Visit: Payer: Self-pay | Admitting: Family Medicine

## 2022-06-09 ENCOUNTER — Telehealth: Payer: Self-pay

## 2022-06-09 MED ORDER — TOUJEO MAX SOLOSTAR 300 UNIT/ML ~~LOC~~ SOPN: 46.0000 [IU] | PEN_INJECTOR | Freq: Every day | SUBCUTANEOUS | 3 refills | Status: DC

## 2022-06-09 MED ORDER — INSULIN PEN NEEDLE 30G X 8 MM MISC: 1.0000 | 0 refills | Status: DC | PRN

## 2022-06-09 NOTE — Telephone Encounter (Signed)
Pt called stated that per pcp increase his insulin to 46unit (Toujeo Max)?  Plus refill for Novofine pen   Pls advice?

## 2022-06-14 ENCOUNTER — Other Ambulatory Visit: Payer: Self-pay

## 2022-06-15 ENCOUNTER — Other Ambulatory Visit: Payer: Self-pay

## 2022-06-15 ENCOUNTER — Telehealth: Payer: Self-pay

## 2022-06-15 DIAGNOSIS — E1100 Type 2 diabetes mellitus with hyperosmolarity without nonketotic hyperglycemic-hyperosmolar coma (NKHHC): Secondary | ICD-10-CM

## 2022-06-15 NOTE — Telephone Encounter (Signed)
Pt requested 90 day supply of Toujeo be sent to [facility]. Rx called in at [phone], spoke with Lemmie Evens, Pharmacy Tech. Pt aware. Mjp,lpn

## 2022-06-23 ENCOUNTER — Other Ambulatory Visit: Payer: Self-pay | Admitting: Family Medicine

## 2022-06-23 ENCOUNTER — Encounter: Payer: Self-pay | Admitting: Family Medicine

## 2022-06-23 MED ORDER — TRULICITY 3 MG/0.5ML ~~LOC~~ SOAJ: 3.0000 mg | SUBCUTANEOUS | 3 refills | Status: DC

## 2022-06-29 ENCOUNTER — Other Ambulatory Visit: Payer: Self-pay | Admitting: Family Medicine

## 2022-06-29 NOTE — Telephone Encounter (Signed)
Requested Prescriptions  Pending Prescriptions Disp Refills   ONETOUCH ULTRA test strip [Pharmacy Med Name: [facility] TEST STRP] 100 strip 12    Sig: TEST EVERY DAY AS DIRECTED     Endocrinology: Diabetes - Testing Supplies Failed - 06/29/2022  1:56 AM      Failed - Valid encounter within last 12 months    Recent Outpatient Visits           1 year ago Type 2 diabetes mellitus with hyperosmolarity without coma, without long-term current use of insulin ([place])   [place] Pickard, Cammie Mcgee, MD   1 year ago Laryngospasm   [place] Pickard, Cammie Mcgee, MD   1 year ago Encounter for immunization   [place] Pickard, Cammie Mcgee, MD   1 year ago Upper respiratory tract infection, unspecified type   [place] Eulogio Bear, NP   2 years ago Type 2 diabetes mellitus with hyperosmolarity without coma, without long-term current use of insulin ([place])   [facility] Pickard, Cammie Mcgee, MD

## 2022-07-29 IMAGING — DX DG CHEST 2V
2 series · 2 of 2 positions shown · non-contrast
Comparison: 05/26/2019 chest radiograph.

CLINICAL DATA: Cough and congestion for 2-3 weeks, improving

EXAM:
CHEST - 2 VIEW

[dg chest 2 view (1 of 2)]
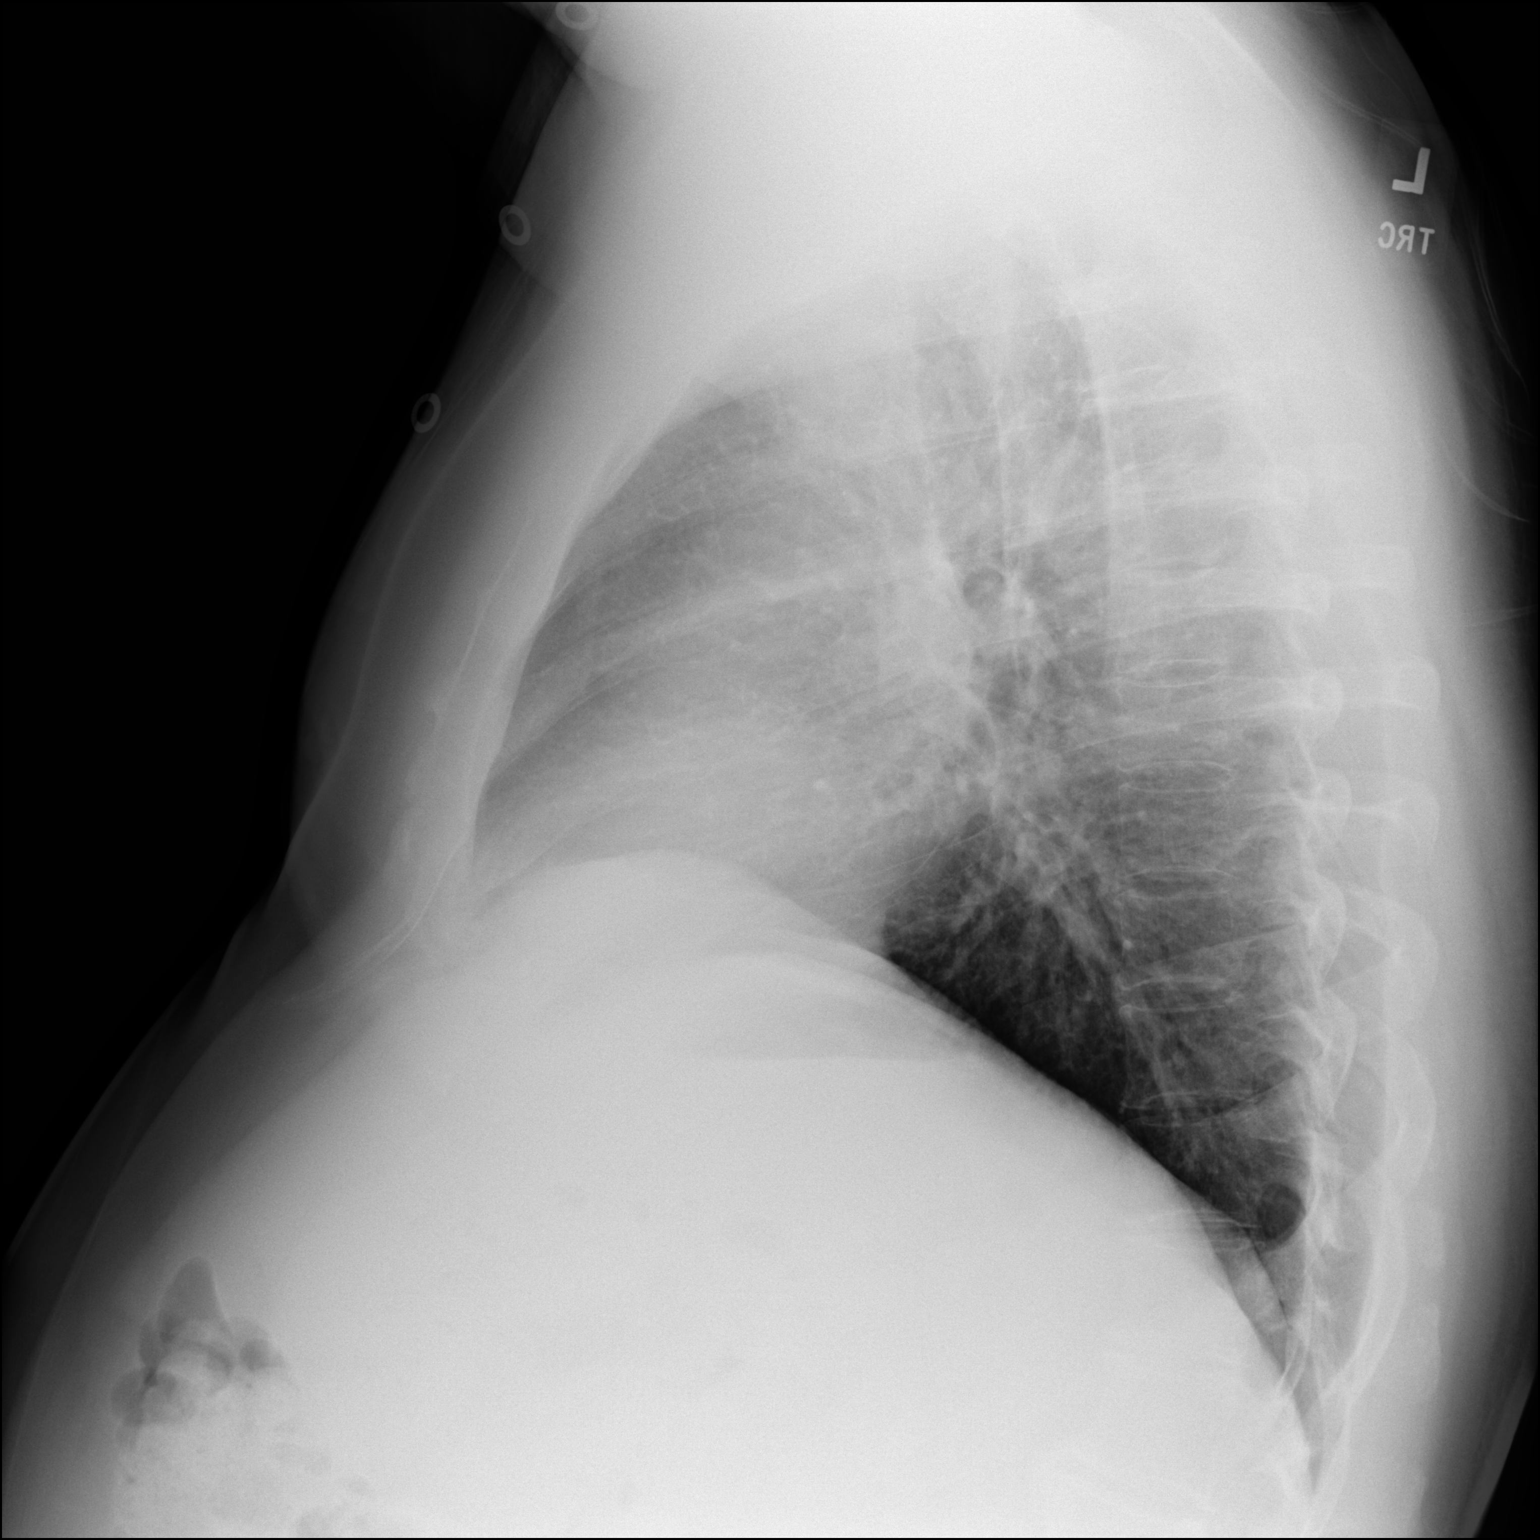

[dg chest 2 view (2 of 2)]
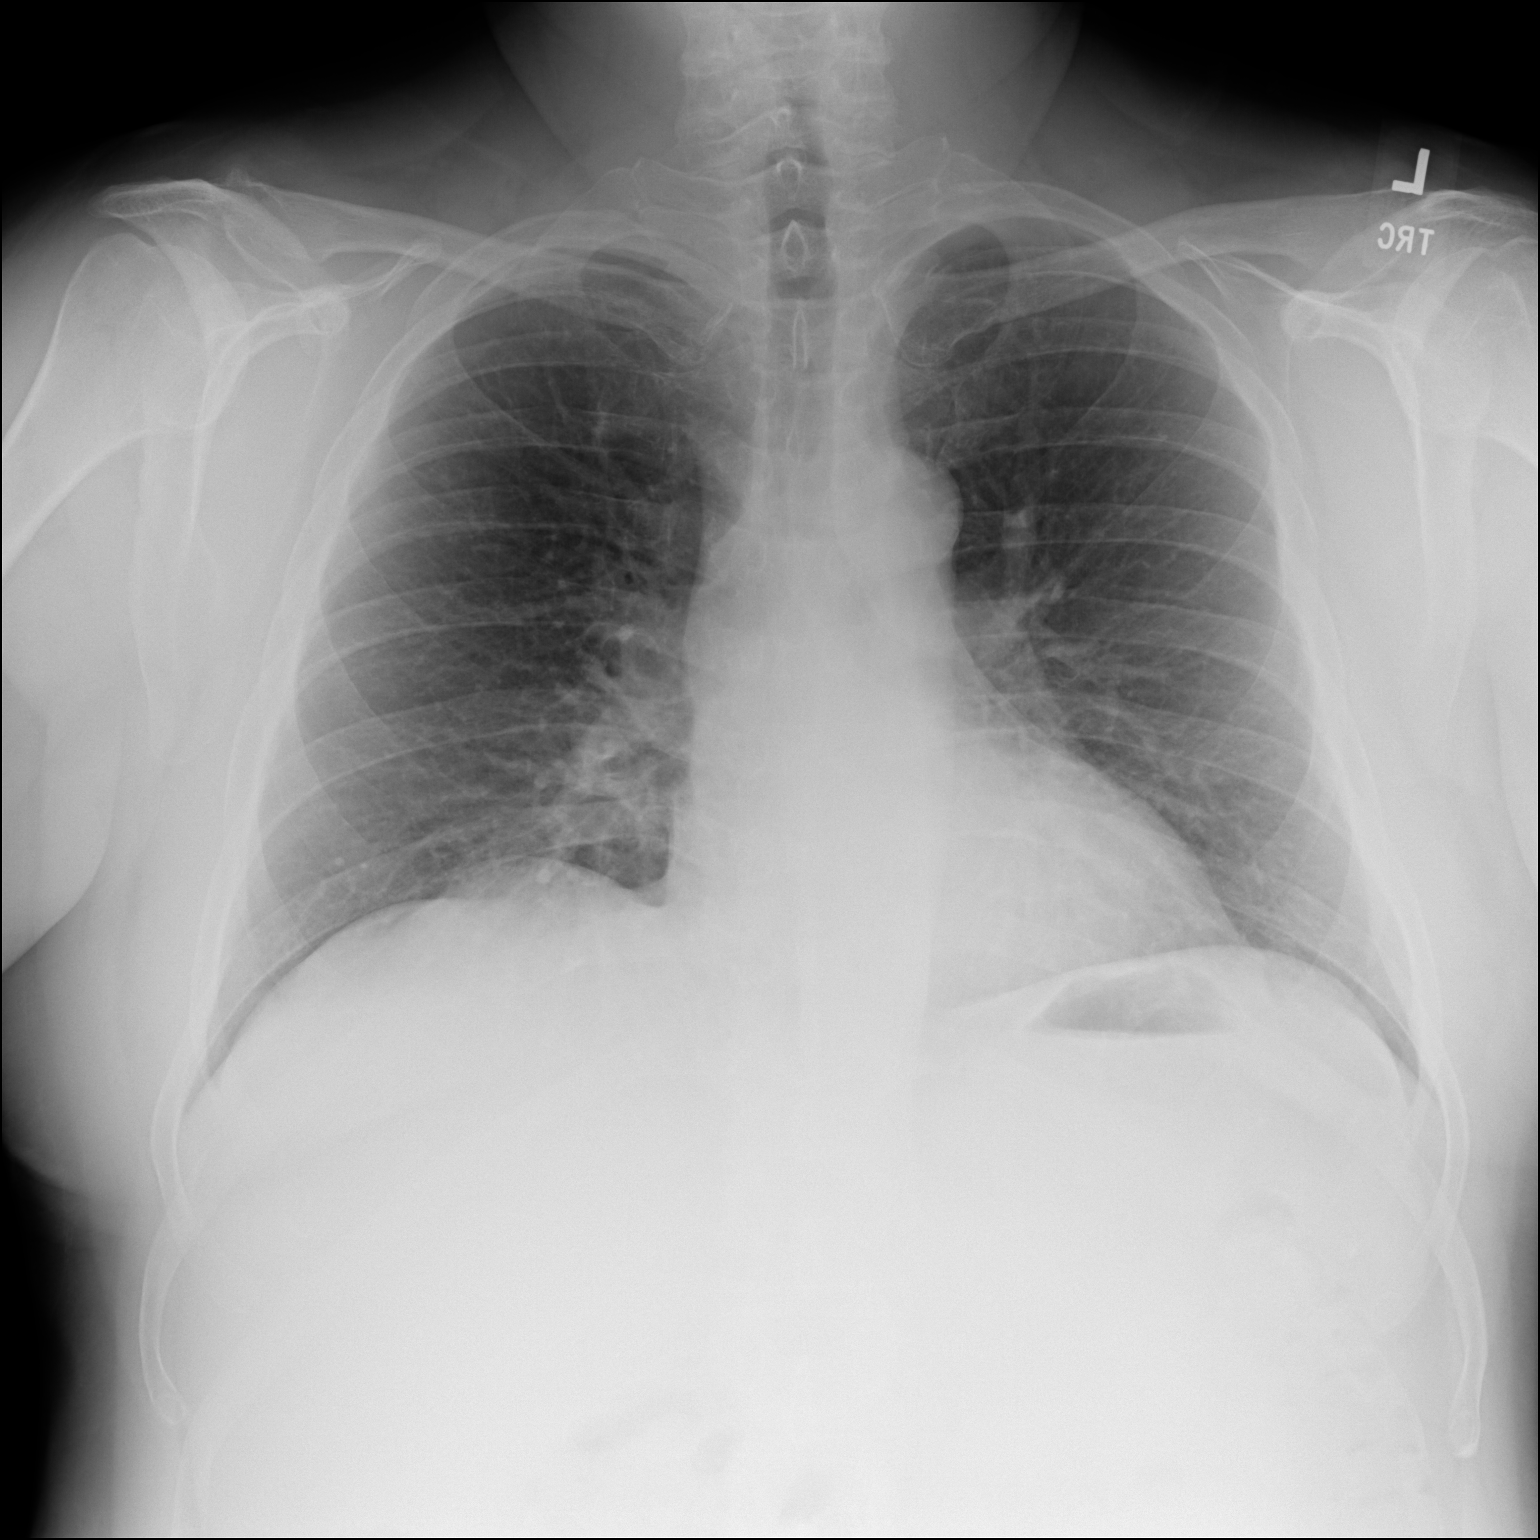

[2 of 2 positions shown; findings below may reference images not displayed]

FINDINGS: Stable cardiomediastinal silhouette with normal heart size. No
pneumothorax. No pleural effusion. Lungs appear clear, with no acute
consolidative airspace disease and no pulmonary edema.
IMPRESSION: No active cardiopulmonary disease.

## 2022-08-26 ENCOUNTER — Other Ambulatory Visit: Payer: Self-pay | Admitting: Family Medicine

## 2022-08-26 DIAGNOSIS — E1165 Type 2 diabetes mellitus with hyperglycemia: Secondary | ICD-10-CM

## 2022-08-28 NOTE — Telephone Encounter (Signed)
Requested medication (s) are due for refill today: yes  Requested medication (s) are on the active medication list: yes  Last refill:  03/03/22  Future visit scheduled: no  Notes to clinic:  Unable to refill per protocol due to failed labs, no updated A1c results.      Requested Prescriptions  Pending Prescriptions Disp Refills   metFORMIN (GLUCOPHAGE) 1000 MG tablet [Pharmacy Med Name: METFORMIN HCL 1,000 MG TABLET] 180 tablet 1    Sig: TAKE 1 TABLET BY MOUTH TWICE A DAY WITH FOOD     Endocrinology:  Diabetes - Biguanides Failed - 08/26/2022  8:36 AM      Failed - HBA1C is between 0 and 7.9 and within 180 days    Hgb A1C (fingerstick)  Date Value Ref Range Status  08/18/2015 11.7 (H) <5.7 % Final    Comment:                                                                           According to the ADA Clinical Practice Recommendations for 2011, when HbA1c is used as a screening test:     >=6.5%   Diagnostic of Diabetes Mellitus            (if abnormal result is confirmed)   5.7-6.4%   Increased risk of developing Diabetes Mellitus   References:Diagnosis and Classification of Diabetes Mellitus,Diabetes Care,2011,34(Suppl 1):S62-S69 and Standards of Medical Care in         Diabetes - 2011,Diabetes Care,2011,34 (Suppl 1):S11-S61.      Hgb A1c MFr Bld  Date Value Ref Range Status  02/02/2022 8.3 (H) <5.7 % of total Hgb Final    Comment:    For someone without known diabetes, a hemoglobin A1c value of 6.5% or greater indicates that they may have  diabetes and this should be confirmed with a follow-up  test. . For someone with known diabetes, a value <7% indicates  that their diabetes is well controlled and a value  greater than or equal to 7% indicates suboptimal  control. A1c targets should be individualized based on  duration of diabetes, age, comorbid conditions, and  other considerations. . Currently, no consensus exists regarding use of hemoglobin A1c for  diagnosis of diabetes for children. .          Failed - eGFR in normal range and within 360 days    GFR, Est African American  Date Value Ref Range Status  12/16/2019 86 > OR = 60 mL/min/1.78m2 Final   GFR, Est Non African American  Date Value Ref Range Status  12/16/2019 74 > OR = 60 mL/min/1.66m2 Final   eGFR  Date Value Ref Range Status  01/20/2021 84 > OR = 60 mL/min/1.57m2 Final    Comment:    The eGFR is based on the CKD-EPI 2021 equation. To calculate  the new eGFR from a previous Creatinine or Cystatin C result, go to https://www.kidney.org/professionals/ kdoqi/gfr%5Fcalculator          Failed - B12 Level in normal range and within 720 days    No results found for: "VITAMINB12"       Failed - Valid encounter within last 6 months    Recent Outpatient Visits  1 year ago Type 2 diabetes mellitus with hyperosmolarity without coma, without long-term current use of insulin (HCC)   [facility] Pickard, Priscille Heidelberg, MD   1 year ago Laryngospasm   [facility] Tanya Nones, Priscille Heidelberg, MD   1 year ago Encounter for immunization   [facility] Pickard, Priscille Heidelberg, MD   1 year ago Upper respiratory tract infection, unspecified type   [facility] Valentino Nose, NP   2 years ago Type 2 diabetes mellitus with hyperosmolarity without coma, without long-term current use of insulin (HCC)   [facility] Pickard, Priscille Heidelberg, MD              Passed - Cr in normal range and within 360 days    Creat  Date Value Ref Range Status  02/02/2022 1.08 0.70 - 1.30 mg/dL Final   Creatinine, Urine  Date Value Ref Range Status  01/20/2021 40 20 - 320 mg/dL Final         Passed - CBC within normal limits and completed in the last 12 months    WBC  Date Value Ref Range Status  02/02/2022 8.3 3.8 - 10.8 Thousand/uL Final   RBC  Date Value Ref Range Status  02/02/2022 5.91 (H) 4.20 - 5.80 Million/uL  Final   Hemoglobin  Date Value Ref Range Status  02/02/2022 17.0 13.2 - 17.1 g/dL Final   HCT  Date Value Ref Range Status  02/02/2022 49.5 38.5 - 50.0 % Final   MCHC  Date Value Ref Range Status  02/02/2022 34.3 32.0 - 36.0 g/dL Final   [facility]  Date Value Ref Range Status  02/02/2022 28.8 27.0 - 33.0 pg Final   MCV  Date Value Ref Range Status  02/02/2022 83.8 80.0 - 100.0 fL Final   No results found for: "PLTCOUNTKUC", "LABPLAT", "POCPLA" RDW  Date Value Ref Range Status  02/02/2022 14.4 11.0 - 15.0 % Final

## 2022-08-31 ENCOUNTER — Other Ambulatory Visit: Payer: Self-pay | Admitting: Family Medicine

## 2022-09-17 ENCOUNTER — Other Ambulatory Visit: Payer: Self-pay | Admitting: Family Medicine

## 2022-10-08 ENCOUNTER — Other Ambulatory Visit: Payer: Self-pay | Admitting: Family Medicine

## 2022-10-09 NOTE — Telephone Encounter (Signed)
Requested Prescriptions  Pending Prescriptions Disp Refills   glipiZIDE (GLUCOTROL XL) 10 MG 24 hr tablet [Pharmacy Med Name: GLIPIZIDE ER 10 MG TABLET] 90 tablet 0    Sig: TAKE 1 TABLET BY MOUTH EVERY DAY WITH BREAKFAST     Endocrinology:  Diabetes - Sulfonylureas Failed - 10/08/2022  8:47 AM      Failed - HBA1C is between 0 and 7.9 and within 180 days    Hgb A1C (fingerstick)  Date Value Ref Range Status  08/18/2015 11.7 (H) <5.7 % Final    Comment:                                                                           According to the ADA Clinical Practice Recommendations for 2011, when HbA1c is used as a screening test:     >=6.5%   Diagnostic of Diabetes Mellitus            (if abnormal result is confirmed)   5.7-6.4%   Increased risk of developing Diabetes Mellitus   References:Diagnosis and Classification of Diabetes Mellitus,Diabetes Care,2011,34(Suppl 1):S62-S69 and Standards of Medical Care in         Diabetes - 2011,Diabetes Care,2011,34 (Suppl 1):S11-S61.      Hgb A1c MFr Bld  Date Value Ref Range Status  02/02/2022 8.3 (H) <5.7 % of total Hgb Final    Comment:    For someone without known diabetes, a hemoglobin A1c value of 6.5% or greater indicates that they may have  diabetes and this should be confirmed with a follow-up  test. . For someone with known diabetes, a value <7% indicates  that their diabetes is well controlled and a value  greater than or equal to 7% indicates suboptimal  control. A1c targets should be individualized based on  duration of diabetes, age, comorbid conditions, and  other considerations. . Currently, no consensus exists regarding use of hemoglobin A1c for diagnosis of diabetes for children. .          Failed - Valid encounter within last 6 months    Recent Outpatient Visits           1 year ago Type 2 diabetes mellitus with hyperosmolarity without coma, without long-term current use of insulin (HCC)   [facility] Pickard, Priscille Heidelberg, MD   1 year ago Laryngospasm   [facility] Pickard, Priscille Heidelberg, MD   1 year ago Encounter for immunization   [facility] Pickard, Priscille Heidelberg, MD   2 years ago Upper respiratory tract infection, unspecified type   [facility] Valentino Nose, NP   2 years ago Type 2 diabetes mellitus with hyperosmolarity without coma, without long-term current use of insulin (HCC)   [facility] Pickard, Priscille Heidelberg, MD       Future Appointments             In 1 week Pickard, Priscille Heidelberg, MD [facility]            Passed - Cr in normal range and within 360 days    Creat  Date Value Ref Range Status  02/02/2022 1.08 0.70 -  1.30 mg/dL Final   Creatinine, Urine  Date Value Ref Range Status  01/20/2021 40 20 - 320 mg/dL Final

## 2022-10-09 NOTE — Telephone Encounter (Signed)
Patient called and advised he is due for a 6 mo f/u due to diabetes, he verbalized understanding. Scheduled for 10/17/22. He asked if he will need blood work, advised his physical is due in October and that's usually when blood work is done. He asks if someone will call to let him know. Placed this request in appointment notes to call patient if labs are needed prior to appointment.

## 2022-10-10 LAB — HM DIABETES EYE EXAM

## 2022-10-12 ENCOUNTER — Other Ambulatory Visit: Payer: 59

## 2022-10-12 DIAGNOSIS — E1100 Type 2 diabetes mellitus with hyperosmolarity without nonketotic hyperglycemic-hyperosmolar coma (NKHHC): Secondary | ICD-10-CM

## 2022-10-13 LAB — HEMOGLOBIN A1C
Hgb A1c MFr Bld: 8.1 % of total Hgb — ABNORMAL HIGH (ref <5.7)
Mean Plasma Glucose: 186 mg/dL
eAG (mmol/L): 10.3 mmol/L

## 2022-10-17 ENCOUNTER — Ambulatory Visit (INDEPENDENT_AMBULATORY_CARE_PROVIDER_SITE_OTHER): Payer: 59 | Admitting: Family Medicine

## 2022-10-17 ENCOUNTER — Encounter: Payer: Self-pay | Admitting: Family Medicine

## 2022-10-17 VITALS — BP 132/82 | HR 86 | Temp 98.6°F | Ht 70.0 in | Wt 234.2 lb

## 2022-10-17 DIAGNOSIS — E1100 Type 2 diabetes mellitus with hyperosmolarity without nonketotic hyperglycemic-hyperosmolar coma (NKHHC): Secondary | ICD-10-CM | POA: Diagnosis not present

## 2022-10-17 DIAGNOSIS — Z7984 Long term (current) use of oral hypoglycemic drugs: Secondary | ICD-10-CM | POA: Diagnosis not present

## 2022-10-17 MED ORDER — TOUJEO MAX SOLOSTAR 300 UNIT/ML ~~LOC~~ SOPN: 60.0000 [IU] | PEN_INJECTOR | Freq: Every day | SUBCUTANEOUS | 3 refills | Status: DC

## 2022-10-17 MED ORDER — ONDANSETRON HCL 4 MG PO TABS: 4.0000 mg | ORAL_TABLET | Freq: Three times a day (TID) | ORAL | 0 refills | Status: DC | PRN

## 2022-10-17 NOTE — Progress Notes (Signed)
Subjective:    Patient ID: Joseph [name], male    DOB: 12/24/1960, 62 y.o.   MRN: [id number]  Patient is on 46 units of Toujeo.  He is also on glipizide (not sure if this is beneficial or not) metformin, Jardiance, Trulicity 3 mg subcu weekly, and pioglitazone.  His fasting blood sugars are typically 140-150.  His 2-hour postprandial sugars are close to 190 especially after breakfast and after lunch.  His evening sugars are closer to 180.  His A1c is greater than 8.  Is not having any hypoglycemic episodes.  He is here today to discuss insulin titration Past Medical History:  Diagnosis Date   Allergy    Diabetes mellitus without complication (HCC)    Hyperlipidemia    Hypertension    Past Surgical History:  Procedure Laterality Date   COLONOSCOPY  2015   INSERTION OF MESH N/A 10/31/2017   Procedure: INSERTION OF MESH;  Surgeon: Abigail Miyamoto, MD;  Location: [facility];  Service: General;  Laterality: N/A;   UMBILICAL HERNIA REPAIR N/A 10/31/2017   Procedure: UMBILICAL HERNIA REPAIR WITH MESH;  Surgeon: Abigail Miyamoto, MD;  Location: [facility];  Service: General;  Laterality: N/A;   VASECTOMY  2006   Current Outpatient Medications on File Prior to Visit  Medication Sig Dispense Refill   albuterol (VENTOLIN HFA) 108 (90 Base) MCG/ACT inhaler TAKE 2 PUFFS BY MOUTH EVERY 6 HOURS AS NEEDED FOR WHEEZE OR SHORTNESS OF BREATH 8.5 each 1   amLODipine (NORVASC) 10 MG tablet Take 1 tablet (10 mg total) by mouth daily. 90 tablet 3   ascorbic acid (VITAMIN C) 500 MG tablet Take 1 tablet (500 mg total) by mouth daily. 10 tablet 0   aspirin 81 MG tablet Take 81 mg by mouth daily.     atorvastatin (LIPITOR) 40 MG tablet TAKE 1 TABLET BY MOUTH EVERYDAY AT BEDTIME 90 tablet 3   B-D ULTRAFINE III SHORT PEN 31G X 8 MM MISC SMARTSIG:1 Each SUB-Q Daily     blood glucose meter kit and supplies KIT Dispense based on patient and insurance preference. Check fasting blood sugar each morning.  E11.65 1 each 0   Blood  Glucose Monitoring Suppl (BLOOD GLUCOSE SYSTEM PAK) KIT Please dispense based on patient and insurance preference. Use as instructed to monitor FSBS 1x daily. Dx E11.9. 1 kit 1   COMBIVENT RESPIMAT 20-100 MCG/ACT AERS respimat INHALE 2 PUFFS INTO THE LUNGS EVERY 6 HOURS AS NEEDED FOR WHEEZE 4 g 1   Continuous Blood Gluc Sensor (FREESTYLE LIBRE 2 SENSOR) MISC USE AS DIRECTED TO MONITOR BLOOD GLUCOSE. DX: E11.9. 2 each 11   Dulaglutide (TRULICITY) 3 MG/0.5ML SOPN Inject 3 mg as directed once a week. 6 mL 3   erythromycin ophthalmic ointment Place 1 Application into both eyes at bedtime. 3.5 g 2   fluticasone (FLONASE) 50 MCG/ACT nasal spray Place 2 sprays into both nostrils daily. 48 g 11   glipiZIDE (GLUCOTROL XL) 10 MG 24 hr tablet TAKE 1 TABLET BY MOUTH EVERY DAY WITH BREAKFAST 90 tablet 0   Insulin Pen Needle (NOVOFINE) 30G X 8 MM MISC Inject 10 each into the skin as needed (Daily while on lantus.). 90 each 0   JARDIANCE 25 MG TABS tablet TAKE 1 TABLET (25 MG TOTAL) BY MOUTH DAILY. 90 tablet 3   lisinopril-hydrochlorothiazide (ZESTORETIC) 20-12.5 MG tablet TAKE 2 TABLETS BY MOUTH EVERY DAY 180 tablet 1   loratadine (CLARITIN) 10 MG tablet Take 10 mg by mouth daily as  needed for allergies.     metFORMIN (GLUCOPHAGE) 1000 MG tablet TAKE 1 TABLET BY MOUTH TWICE A DAY WITH FOOD 180 tablet 1   Multiple Vitamins-Minerals (MULTIVITAMIN PO) Take 1 tablet by mouth daily.      omeprazole (PRILOSEC) 40 MG capsule TAKE 1 CAPSULE (40 MG TOTAL) BY MOUTH DAILY. 90 capsule 1   OneTouch Delica Lancets 33G MISC USE AS DIRECTED EVERY DAY 100 each 1   ONETOUCH ULTRA test strip TEST EVERY DAY AS DIRECTED 100 strip 12   sildenafil (VIAGRA) 100 MG tablet TAKE 1/2 TO 1 (ONE-HALF TO ONE) TABLET BY MOUTH ONCE DAILY AS NEEDED FOR ERECTILE DYSFUNCTION 30 tablet 0   triamcinolone (KENALOG) 0.1 % paste Use as directed 1 application in the mouth or throat 2 (two) times daily. 20 g 2   trimethoprim-polymyxin b (POLYTRIM)  ophthalmic solution Place 2 drops into the left eye every 4 (four) hours. 10 mL 0   zinc sulfate 220 (50 Zn) MG capsule Take 1 capsule (220 mg total) by mouth daily. 10 capsule 0   Current Facility-Administered Medications on File Prior to Visit  Medication Dose Route Frequency Provider Last Rate Last Admin   insulin starter kit- pen needles (English) 1 kit  1 kit Other Once Donita Brooks, MD        No Known Allergies Social History   Socioeconomic History   Marital status: Single    Spouse name: Not on file   Number of children: Not on file   Years of education: Not on file   Highest education level: Not on file  Occupational History   Not on file  Tobacco Use   Smoking status: Never   Smokeless tobacco: Never  Vaping Use   Vaping Use: Never used  Substance and Sexual Activity   Alcohol use: No   Drug use: No   Sexual activity: Yes    Comment: married to [place].  Three kids.  exercising daily.  Other Topics Concern   Not on file  Social History Narrative   Not on file   Social Determinants of Health   Financial Resource Strain: Not on file  Food Insecurity: Not on file  Transportation Needs: Not on file  Physical Activity: Not on file  Stress: Not on file  Social Connections: Not on file  Intimate Partner Violence: Not on file     Review of Systems  All other systems reviewed and are negative.      Objective:   Physical Exam Vitals reviewed.  Constitutional:      Appearance: He is well-developed.  HENT:     Nose:     Right Turbinates: Swollen.     Left Turbinates: Swollen.     Left Sinus: Frontal sinus tenderness present.     Mouth/Throat:     Mouth: No injury, lacerations, oral lesions or angioedema.     Tongue: No lesions.  Cardiovascular:     Rate and Rhythm: Normal rate and regular rhythm.     Heart sounds: Normal heart sounds. No murmur heard.    No friction rub. No gallop.  Pulmonary:     Effort: Pulmonary effort is normal. No  respiratory distress.     Breath sounds: Normal breath sounds. No stridor. No wheezing or rales.  Chest:     Chest wall: No tenderness.  Musculoskeletal:     Cervical back: Neck supple.  Lymphadenopathy:     Cervical: No cervical adenopathy.  Neurological:     Mental Status:  He is alert and oriented to person, place, and time.     Cranial Nerves: No cranial nerve deficit.     Coordination: Coordination normal.         Assessment & Plan:  Type 2 diabetes mellitus with hyperosmolarity without coma, without long-term current use of insulin (HCC) We discussed the patient's options today.  All of his blood sugars both fasting and postprandial are above goal.  Therefore I feel that the pending to uptitrate his Toujeo.  I will increase Toujeo 3 units every week and monitor his blood sugars.  I anticipate that over the next 2 weeks he will approach 60 units and at 60 units I suspect this pathologic in the lumbar.  Can we need to add mealtime insulin if he continues to have postprandial spikes greater than 180.  He will give me an update via email.  Once his sugars are under control, I will try to stop the glipizide to see if the patient receives any benefit from the glipizide given the amount of insulin he is taking.  We also discussed possibly switching Trulicity to [facility] but at the present time the patient prefers to uptitrate his insulin.

## 2022-10-24 ENCOUNTER — Other Ambulatory Visit: Payer: Self-pay | Admitting: Family Medicine

## 2022-10-29 ENCOUNTER — Other Ambulatory Visit: Payer: Self-pay | Admitting: Family Medicine

## 2022-10-30 NOTE — Telephone Encounter (Signed)
Patient called to follow up on refill request for insulin needles; stated he's been out of needles since last week. Patient purchased needles OTC but they're not long enough.   BD UF SHORT PEN NEEDLE X 31G **Requesting a 90 day script**  Pharmacy confirmed as  [id number] G[place] Otto, [place] - [address] [place] [address] Phone: [phone]  Fax: [phone] DEA #: [id number]   Please advise at [phone].

## 2022-11-24 ENCOUNTER — Encounter: Payer: Self-pay | Admitting: Family Medicine

## 2022-11-25 ENCOUNTER — Other Ambulatory Visit: Payer: Self-pay | Admitting: Family Medicine

## 2022-12-02 ENCOUNTER — Other Ambulatory Visit: Payer: Self-pay | Admitting: Family Medicine

## 2022-12-28 ENCOUNTER — Other Ambulatory Visit: Payer: Self-pay | Admitting: Family Medicine

## 2022-12-28 NOTE — Telephone Encounter (Signed)
Requested medication (s) are due for refill today: Yes  Requested medication (s) are on the active medication list: Yes  Last refill:  11/27/22  Future visit scheduled: No  Notes to clinic:  Unable to refill per protocol, courtesy refill already given, routing for provider approval.      Requested Prescriptions  Pending Prescriptions Disp Refills   omeprazole (PRILOSEC) 40 MG capsule [Pharmacy Med Name: OMEPRAZOLE DR 40 MG CAPSULE] 30 capsule 0    Sig: TAKE 1 CAPSULE (40 MG TOTAL) BY MOUTH DAILY.     Gastroenterology: Proton Pump Inhibitors Failed - 12/28/2022  1:42 AM      Failed - Valid encounter within last 12 months    Recent Outpatient Visits           1 year ago Type 2 diabetes mellitus with hyperosmolarity without coma, without long-term current use of insulin (HCC)   [facility] Medicine Pickard, Priscille Heidelberg, MD   1 year ago Laryngospasm   [facility] Tanya Nones, Priscille Heidelberg, MD   1 year ago Encounter for immunization   [facility] Medicine Pickard, Priscille Heidelberg, MD   2 years ago Upper respiratory tract infection, unspecified type   [facility] Medicine Valentino Nose, NP   2 years ago Type 2 diabetes mellitus with hyperosmolarity without coma, without long-term current use of insulin (HCC)   [facility] Medicine Pickard, Priscille Heidelberg, MD

## 2023-01-10 ENCOUNTER — Other Ambulatory Visit: Payer: Self-pay | Admitting: Family Medicine

## 2023-01-11 NOTE — Telephone Encounter (Signed)
Requested Prescriptions  Pending Prescriptions Disp Refills   glipiZIDE (GLUCOTROL XL) 10 MG 24 hr tablet [Pharmacy Med Name: GLIPIZIDE ER 10 MG TABLET] 90 tablet 0    Sig: TAKE 1 TABLET BY MOUTH EVERY DAY WITH BREAKFAST     Endocrinology:  Diabetes - Sulfonylureas Failed - 01/10/2023  1:31 AM      Failed - HBA1C is between 0 and 7.9 and within 180 days    Hgb A1C (fingerstick)  Date Value Ref Range Status  08/18/2015 11.7 (H) <5.7 % Final    Comment:                                                                           According to the ADA Clinical Practice Recommendations for 2011, when HbA1c is used as a screening test:     >=6.5%   Diagnostic of Diabetes Mellitus            (if abnormal result is confirmed)   5.7-6.4%   Increased risk of developing Diabetes Mellitus   References:Diagnosis and Classification of Diabetes Mellitus,Diabetes Care,2011,34(Suppl 1):S62-S69 and Standards of Medical Care in         Diabetes - 2011,Diabetes Care,2011,34 (Suppl 1):S11-S61.      Hgb A1c MFr Bld  Date Value Ref Range Status  10/12/2022 8.1 (H) <5.7 % of total Hgb Final    Comment:    For someone without known diabetes, a hemoglobin A1c value of 6.5% or greater indicates that they may have  diabetes and this should be confirmed with a follow-up  test. . For someone with known diabetes, a value <7% indicates  that their diabetes is well controlled and a value  greater than or equal to 7% indicates suboptimal  control. A1c targets should be individualized based on  duration of diabetes, age, comorbid conditions, and  other considerations. . Currently, no consensus exists regarding use of hemoglobin A1c for diagnosis of diabetes for children. .          Failed - Valid encounter within last 6 months    Recent Outpatient Visits           1 year ago Type 2 diabetes mellitus with hyperosmolarity without coma, without long-term current use of insulin (HCC)   [facility]icine Pickard, Priscille Heidelberg, MD   1 year ago Laryngospasm   [facility] Pickard, Priscille Heidelberg, MD   1 year ago Encounter for immunization   [facility] Medicine Pickard, Priscille Heidelberg, MD   2 years ago Upper respiratory tract infection, unspecified type   [facility] Medicine Cathlean Marseilles A, NP   2 years ago Type 2 diabetes mellitus with hyperosmolarity without coma, without long-term current use of insulin (HCC)   [facility] Medicine Pickard, Priscille Heidelberg, MD              Passed - Cr in normal range and within 360 days    Creat  Date Value Ref Range Status  02/02/2022 1.08 0.70 - 1.30 mg/dL Final   Creatinine, Urine  Date Value Ref Range Status  01/20/2021 40 20 - 320 mg/dL Final

## 2023-01-24 ENCOUNTER — Other Ambulatory Visit: Payer: Self-pay | Admitting: Family Medicine

## 2023-01-24 NOTE — Telephone Encounter (Signed)
Requested Prescriptions  Pending Prescriptions Disp Refills   amLODipine (NORVASC) 10 MG tablet [Pharmacy Med Name: AMLODIPINE BESYLATE 10 MG TAB] 90 tablet 1    Sig: TAKE 1 TABLET BY MOUTH EVERY DAY     Cardiovascular: Calcium Channel Blockers 2 Failed - 01/24/2023  2:05 AM      Failed - Valid encounter within last 6 months    Recent Outpatient Visits           1 year ago Type 2 diabetes mellitus with hyperosmolarity without coma, without long-term current use of insulin (HCC)   [facility] Medicine Pickard, Priscille Heidelberg, MD   1 year ago Laryngospasm   [facility] Medicine Pickard, Priscille Heidelberg, MD   1 year ago Encounter for immunization   [facility] Medicine Pickard, Priscille Heidelberg, MD   2 years ago Upper respiratory tract infection, unspecified type   [facility] Medicine Cathlean Marseilles A, NP   2 years ago Type 2 diabetes mellitus with hyperosmolarity without coma, without long-term current use of insulin (HCC)   [facility] Medicine Pickard, Priscille Heidelberg, MD              Passed - Last BP in normal range    BP Readings from Last 1 Encounters:  10/17/22 132/82         Passed - Last Heart Rate in normal range    Pulse Readings from Last 1 Encounters:  10/17/22 86

## 2023-02-23 ENCOUNTER — Encounter: Payer: Self-pay | Admitting: Family Medicine

## 2023-02-25 ENCOUNTER — Other Ambulatory Visit: Payer: Self-pay | Admitting: Family Medicine

## 2023-02-25 DIAGNOSIS — E1165 Type 2 diabetes mellitus with hyperglycemia: Secondary | ICD-10-CM

## 2023-02-26 NOTE — Telephone Encounter (Signed)
Requested medications are due for refill today.  yes  Requested medications are on the active medications list.  yes  Last refill. 08/28/2022 #180 1 rf  Future visit scheduled.   yes  Notes to clinic.  Labs are expired.    Requested Prescriptions  Pending Prescriptions Disp Refills   metFORMIN (GLUCOPHAGE) 1000 MG tablet [Pharmacy Med Name: METFORMIN HCL 1,000 MG TABLET] 180 tablet 1    Sig: TAKE 1 TABLET BY MOUTH TWICE A DAY WITH FOOD     Endocrinology:  Diabetes - Biguanides Failed - 02/25/2023  9:13 AM      Failed - Cr in normal range and within 360 days    Creat  Date Value Ref Range Status  02/02/2022 1.08 0.70 - 1.30 mg/dL Final   Creatinine, Urine  Date Value Ref Range Status  01/20/2021 40 20 - 320 mg/dL Final         Failed - HBA1C is between 0 and 7.9 and within 180 days    Hgb A1C (fingerstick)  Date Value Ref Range Status  08/18/2015 11.7 (H) <5.7 % Final    Comment:                                                                           According to the ADA Clinical Practice Recommendations for 2011, when HbA1c is used as a screening test:     >=6.5%   Diagnostic of Diabetes Mellitus            (if abnormal result is confirmed)   5.7-6.4%   Increased risk of developing Diabetes Mellitus   References:Diagnosis and Classification of Diabetes Mellitus,Diabetes Care,2011,34(Suppl 1):S62-S69 and Standards of Medical Care in         Diabetes - 2011,Diabetes Care,2011,34 (Suppl 1):S11-S61.      Hgb A1c MFr Bld  Date Value Ref Range Status  10/12/2022 8.1 (H) <5.7 % of total Hgb Final    Comment:    For someone without known diabetes, a hemoglobin A1c value of 6.5% or greater indicates that they may have  diabetes and this should be confirmed with a follow-up  test. . For someone with known diabetes, a value <7% indicates  that their diabetes is well controlled and a value  greater than or equal to 7% indicates suboptimal  control. A1c targets should  be individualized based on  duration of diabetes, age, comorbid conditions, and  other considerations. . Currently, no consensus exists regarding use of hemoglobin A1c for diagnosis of diabetes for children. .          Failed - eGFR in normal range and within 360 days    GFR, Est African American  Date Value Ref Range Status  12/16/2019 86 > OR = 60 mL/min/1.38m2 Final   GFR, Est Non African American  Date Value Ref Range Status  12/16/2019 74 > OR = 60 mL/min/1.100m2 Final   eGFR  Date Value Ref Range Status  01/20/2021 84 > OR = 60 mL/min/1.72m2 Final    Comment:    The eGFR is based on the CKD-EPI 2021 equation. To calculate  the new eGFR from a previous Creatinine or Cystatin C result, go to https://www.kidney.org/professionals/ kdoqi/gfr%5Fcalculator  Failed - B12 Level in normal range and within 720 days    No results found for: "VITAMINB12"       Failed - Valid encounter within last 6 months    Recent Outpatient Visits           1 year ago Type 2 diabetes mellitus with hyperosmolarity without coma, without long-term current use of insulin (HCC)   [facility] Pickard, Priscille Heidelberg, MD   1 year ago Laryngospasm   [facility] Medicine Donita Brooks, MD   2 years ago Encounter for immunization   [facility] Medicine Pickard, Priscille Heidelberg, MD   2 years ago Upper respiratory tract infection, unspecified type   [facility] Medicine Valentino Nose, NP   2 years ago Type 2 diabetes mellitus with hyperosmolarity without coma, without long-term current use of insulin (HCC)   [facility] Medicine Pickard, Priscille Heidelberg, MD       Future Appointments             In 1 month Pickard, Priscille Heidelberg, MD [facility]            Failed - CBC within normal limits and completed in the last 12 months    WBC  Date Value Ref Range Status  02/02/2022 8.3 3.8 - 10.8 Thousand/uL Final   RBC   Date Value Ref Range Status  02/02/2022 5.91 (H) 4.20 - 5.80 Million/uL Final   Hemoglobin  Date Value Ref Range Status  02/02/2022 17.0 13.2 - 17.1 g/dL Final   HCT  Date Value Ref Range Status  02/02/2022 49.5 38.5 - 50.0 % Final   MCHC  Date Value Ref Range Status  02/02/2022 34.3 32.0 - 36.0 g/dL Final   [facility]  Date Value Ref Range Status  02/02/2022 28.8 27.0 - 33.0 pg Final   MCV  Date Value Ref Range Status  02/02/2022 83.8 80.0 - 100.0 fL Final   No results found for: "PLTCOUNTKUC", "LABPLAT", "POCPLA" RDW  Date Value Ref Range Status  02/02/2022 14.4 11.0 - 15.0 % Final

## 2023-03-13 ENCOUNTER — Encounter: Payer: Self-pay | Admitting: Internal Medicine

## 2023-03-22 ENCOUNTER — Other Ambulatory Visit: Payer: 59

## 2023-03-22 DIAGNOSIS — E1100 Type 2 diabetes mellitus with hyperosmolarity without nonketotic hyperglycemic-hyperosmolar coma (NKHHC): Secondary | ICD-10-CM

## 2023-03-22 DIAGNOSIS — E785 Hyperlipidemia, unspecified: Secondary | ICD-10-CM

## 2023-03-22 DIAGNOSIS — I10 Essential (primary) hypertension: Secondary | ICD-10-CM

## 2023-03-23 ENCOUNTER — Other Ambulatory Visit: Payer: 59

## 2023-03-23 LAB — CBC WITH DIFFERENTIAL/PLATELET
Absolute Lymphocytes: 3009 {cells}/uL (ref 850–3900)
Absolute Monocytes: 951 {cells}/uL — ABNORMAL HIGH (ref 200–950)
Basophils Absolute: 59 {cells}/uL (ref 0–200)
Basophils Relative: 0.6 %
Eosinophils Absolute: 157 {cells}/uL (ref 15–500)
Eosinophils Relative: 1.6 %
HCT: 51.2 % — ABNORMAL HIGH (ref 38.5–50.0)
Hemoglobin: 16.7 g/dL (ref 13.2–17.1)
MCH: 27.5 pg (ref 27.0–33.0)
MCHC: 32.6 g/dL (ref 32.0–36.0)
MCV: 84.2 fL (ref 80.0–100.0)
MPV: 11 fL (ref 7.5–12.5)
Monocytes Relative: 9.7 %
Neutro Abs: 5625 {cells}/uL (ref 1500–7800)
Neutrophils Relative %: 57.4 %
Platelets: 320 10*3/uL (ref 140–400)
RBC: 6.08 10*6/uL — ABNORMAL HIGH (ref 4.20–5.80)
RDW: 14.2 % (ref 11.0–15.0)
Total Lymphocyte: 30.7 %
WBC: 9.8 10*3/uL (ref 3.8–10.8)

## 2023-03-23 LAB — COMPLETE METABOLIC PANEL WITH GFR
AG Ratio: 1.8 (calc) (ref 1.0–2.5)
ALT: 47 U/L — ABNORMAL HIGH (ref 9–46)
AST: 29 U/L (ref 10–35)
Albumin: 4.5 g/dL (ref 3.6–5.1)
Alkaline phosphatase (APISO): 65 U/L (ref 35–144)
BUN: 17 mg/dL (ref 7–25)
CO2: 26 mmol/L (ref 20–32)
Calcium: 9.9 mg/dL (ref 8.6–10.3)
Chloride: 99 mmol/L (ref 98–110)
Creat: 0.95 mg/dL (ref 0.70–1.35)
Globulin: 2.5 g/dL (ref 1.9–3.7)
Glucose, Bld: 143 mg/dL — ABNORMAL HIGH (ref 65–99)
Potassium: 3.4 mmol/L — ABNORMAL LOW (ref 3.5–5.3)
Sodium: 139 mmol/L (ref 135–146)
Total Bilirubin: 0.6 mg/dL (ref 0.2–1.2)
Total Protein: 7 g/dL (ref 6.1–8.1)
eGFR: 92 mL/min/{1.73_m2} (ref >=60)

## 2023-03-23 LAB — LIPID PANEL
Cholesterol: 134 mg/dL (ref <200)
HDL: 36 mg/dL — ABNORMAL LOW (ref >=40)
LDL Cholesterol (Calc): 74 mg/dL
Non-HDL Cholesterol (Calc): 98 mg/dL (ref <130)
Total CHOL/HDL Ratio: 3.7 (calc) (ref <5.0)
Triglycerides: 165 mg/dL — ABNORMAL HIGH (ref <150)

## 2023-03-23 LAB — HEMOGLOBIN A1C
Hgb A1c MFr Bld: 8.1 %{Hb} — ABNORMAL HIGH (ref <5.7)
Mean Plasma Glucose: 186 mg/dL
eAG (mmol/L): 10.3 mmol/L

## 2023-03-23 LAB — PROTEIN / CREATININE RATIO, URINE
Creatinine, Urine: 35 mg/dL (ref 20–320)
Protein/Creat Ratio: 286 mg/g{creat} — ABNORMAL HIGH (ref 25–148)
Protein/Creatinine Ratio: 0.286 mg/mg{creat} — ABNORMAL HIGH (ref 0.025–0.148)
Total Protein, Urine: 10 mg/dL (ref 5–25)

## 2023-03-23 LAB — VITAMIN B12: Vitamin B-12: 296 pg/mL (ref 200–1100)

## 2023-03-24 ENCOUNTER — Other Ambulatory Visit: Payer: Self-pay | Admitting: Family Medicine

## 2023-03-26 NOTE — Telephone Encounter (Signed)
Last OV 10/17/22 Requested Prescriptions  Pending Prescriptions Disp Refills   lisinopril-hydrochlorothiazide (ZESTORETIC) 20-12.5 MG tablet [Pharmacy Med Name: LISINOPRIL-HCTZ 20-12.5 MG TAB] 180 tablet 1    Sig: TAKE 2 TABLETS BY MOUTH EVERY DAY     Cardiovascular:  ACEI + Diuretic Combos Failed - 03/24/2023  8:32 AM      Failed - K in normal range and within 180 days    Potassium  Date Value Ref Range Status  03/22/2023 3.4 (L) 3.5 - 5.3 mmol/L Final         Failed - Valid encounter within last 6 months    Recent Outpatient Visits           1 year ago Type 2 diabetes mellitus with hyperosmolarity without coma, without long-term current use of insulin (HCC)   [facility] Pickard, Priscille Heidelberg, MD   1 year ago Laryngospasm   [facility] Medicine Pickard, Priscille Heidelberg, MD   2 years ago Encounter for immunization   [facility] Medicine Pickard, Priscille Heidelberg, MD   2 years ago Upper respiratory tract infection, unspecified type   [facility] Medicine Valentino Nose, NP   2 years ago Type 2 diabetes mellitus with hyperosmolarity without coma, without long-term current use of insulin (HCC)   [facility] Medicine Pickard, Priscille Heidelberg, MD       Future Appointments             In 1 week Pickard, Priscille Heidelberg, MD [facility]            Passed - Na in normal range and within 180 days    Sodium  Date Value Ref Range Status  03/22/2023 139 135 - 146 mmol/L Final         Passed - Cr in normal range and within 180 days    Creat  Date Value Ref Range Status  03/22/2023 0.95 0.70 - 1.35 mg/dL Final   Creatinine, Urine  Date Value Ref Range Status  03/22/2023 35 20 - 320 mg/dL Final         Passed - eGFR is 30 or above and within 180 days    GFR, Est African American  Date Value Ref Range Status  12/16/2019 86 > OR = 60 mL/min/1.76m2 Final   GFR, Est Non African American  Date Value Ref Range Status   12/16/2019 74 > OR = 60 mL/min/1.16m2 Final   eGFR  Date Value Ref Range Status  03/22/2023 92 > OR = 60 mL/min/1.34m2 Final         Passed - Patient is not pregnant      Passed - Last BP in normal range    BP Readings from Last 1 Encounters:  10/17/22 132/82

## 2023-03-27 ENCOUNTER — Encounter: Payer: 59 | Admitting: Family Medicine

## 2023-04-03 ENCOUNTER — Encounter: Payer: Self-pay | Admitting: Family Medicine

## 2023-04-03 ENCOUNTER — Ambulatory Visit: Payer: 59 | Admitting: Family Medicine

## 2023-04-03 VITALS — BP 132/86 | HR 82 | Temp 97.8°F | Ht 70.0 in | Wt 232.6 lb

## 2023-04-03 DIAGNOSIS — E1100 Type 2 diabetes mellitus with hyperosmolarity without nonketotic hyperglycemic-hyperosmolar coma (NKHHC): Secondary | ICD-10-CM

## 2023-04-03 DIAGNOSIS — Z794 Long term (current) use of insulin: Secondary | ICD-10-CM

## 2023-04-03 DIAGNOSIS — Z0000 Encounter for general adult medical examination without abnormal findings: Secondary | ICD-10-CM

## 2023-04-03 DIAGNOSIS — I10 Essential (primary) hypertension: Secondary | ICD-10-CM

## 2023-04-03 DIAGNOSIS — Z23 Encounter for immunization: Secondary | ICD-10-CM

## 2023-04-03 DIAGNOSIS — Z0001 Encounter for general adult medical examination with abnormal findings: Secondary | ICD-10-CM | POA: Diagnosis not present

## 2023-04-03 DIAGNOSIS — Z1211 Encounter for screening for malignant neoplasm of colon: Secondary | ICD-10-CM

## 2023-04-03 DIAGNOSIS — Z125 Encounter for screening for malignant neoplasm of prostate: Secondary | ICD-10-CM

## 2023-04-03 MED ORDER — CELECOXIB 200 MG PO CAPS: 200.0000 mg | ORAL_CAPSULE | Freq: Every day | ORAL | 3 refills | Status: DC

## 2023-04-03 MED ORDER — TIRZEPATIDE 5 MG/0.5ML ~~LOC~~ SOAJ: 5.0000 mg | SUBCUTANEOUS | 3 refills | Status: DC

## 2023-04-03 NOTE — Addendum Note (Signed)
Addended by: Venia Carbon K on: 04/03/2023 10:05 AM   Modules accepted: Orders

## 2023-04-03 NOTE — Progress Notes (Signed)
Subjective:    Patient ID: Joseph [name], male    DOB: 1961/04/05, 62 y.o.   MRN: [id number]  Patient is here today for complete physical exam.  Patient just recently had a diabetic eye exam which was normal 2 months ago.  Diabetic foot exam today and is normal.  Unfortunately his A1c is elevated at 8.1.  There is also elevations in his liver function test.  He has continuous blood glucose readings.  He is in range approximately 67% of the time.  20 to 30% of the time the patient is 1 70-2 50.  This tends to occur between 6 AM and 12 PM.  He seems to spike after breakfast.  He is currently on 60 units of insulin.  He denies any hypoglycemic episodes.  He is due for his colonoscopy.  He is due for PSA.  He is due for a flu shot, tetanus shot, and a shingles vaccine.   Lab on 03/22/2023  Component Date Value Ref Range Status   WBC 03/22/2023 9.8  3.8 - 10.8 Thousand/uL Final   RBC 03/22/2023 6.08 (H)  4.20 - 5.80 Million/uL Final   Hemoglobin 03/22/2023 16.7  13.2 - 17.1 g/dL Final   HCT 78/29/5621 51.2 (H)  38.5 - 50.0 % Final   MCV 03/22/2023 84.2  80.0 - 100.0 fL Final   MCH 03/22/2023 27.5  27.0 - 33.0 pg Final   MCHC 03/22/2023 32.6  32.0 - 36.0 g/dL Final   Comment: For adults, a slight decrease in the calculated MCHC value (in the range of 30 to 32 g/dL) is most likely not clinically significant; however, it should be interpreted with caution in correlation with other red cell parameters and the patient's clinical condition.    RDW 03/22/2023 14.2  11.0 - 15.0 % Final   Platelets 03/22/2023 320  140 - 400 Thousand/uL Final   MPV 03/22/2023 11.0  7.5 - 12.5 fL Final   Neutro Abs 03/22/2023 5,625  1,500 - 7,800 cells/uL Final   Absolute Lymphocytes 03/22/2023 3,009  850 - 3,900 cells/uL Final   Absolute Monocytes 03/22/2023 951 (H)  200 - 950 cells/uL Final   Eosinophils Absolute 03/22/2023 157  15 - 500 cells/uL Final   Basophils Absolute 03/22/2023 59  0 - 200 cells/uL Final    Neutrophils Relative % 03/22/2023 57.4  % Final   Total Lymphocyte 03/22/2023 30.7  % Final   Monocytes Relative 03/22/2023 9.7  % Final   Eosinophils Relative 03/22/2023 1.6  % Final   Basophils Relative 03/22/2023 0.6  % Final   Glucose, Bld 03/22/2023 143 (H)  65 - 99 mg/dL Final   Comment: .            Fasting reference interval . For someone without known diabetes, a glucose value >125 mg/dL indicates that they may have diabetes and this should be confirmed with a follow-up test. .    BUN 03/22/2023 17  7 - 25 mg/dL Final   Creat 30/86/5784 0.95  0.70 - 1.35 mg/dL Final   eGFR 69/62/9528 92  > OR = 60 mL/min/1.74m2 Final   BUN/Creatinine Ratio 03/22/2023 SEE NOTE:  6 - 22 (calc) Final   Comment:    Not Reported: BUN and Creatinine are within    reference range. .    Sodium 03/22/2023 139  135 - 146 mmol/L Final   Potassium 03/22/2023 3.4 (L)  3.5 - 5.3 mmol/L Final   Chloride 03/22/2023 99  98 - 110 mmol/L Final  CO2 03/22/2023 26  20 - 32 mmol/L Final   Calcium 03/22/2023 9.9  8.6 - 10.3 mg/dL Final   Total Protein 16/01/9603 7.0  6.1 - 8.1 g/dL Final   Albumin 54/12/8117 4.5  3.6 - 5.1 g/dL Final   Globulin 14/78/2956 2.5  1.9 - 3.7 g/dL (calc) Final   AG Ratio 03/22/2023 1.8  1.0 - 2.5 (calc) Final   Total Bilirubin 03/22/2023 0.6  0.2 - 1.2 mg/dL Final   Alkaline phosphatase (APISO) 03/22/2023 65  35 - 144 U/L Final   AST 03/22/2023 29  10 - 35 U/L Final   ALT 03/22/2023 47 (H)  9 - 46 U/L Final   Hgb A1c MFr Bld 03/22/2023 8.1 (H)  <5.7 % of total Hgb Final   Comment: For someone without known diabetes, a hemoglobin A1c value of 6.5% or greater indicates that they may have  diabetes and this should be confirmed with a follow-up  test. . For someone with known diabetes, a value <7% indicates  that their diabetes is well controlled and a value  greater than or equal to 7% indicates suboptimal  control. A1c targets should be individualized based on  duration of  diabetes, age, comorbid conditions, and  other considerations. . Currently, no consensus exists regarding use of hemoglobin A1c for diagnosis of diabetes for children. .    Mean Plasma Glucose 03/22/2023 186  mg/dL Final   eAG (mmol/L) 21/30/8657 10.3  mmol/L Final   Cholesterol 03/22/2023 134  <200 mg/dL Final   HDL 84/69/6295 36 (L)  > OR = 40 mg/dL Final   Triglycerides 28/41/3244 165 (H)  <150 mg/dL Final   LDL Cholesterol (Calc) 03/22/2023 74  mg/dL (calc) Final   Comment: Reference range: <100 . Desirable range <100 mg/dL for primary prevention;   <70 mg/dL for patients with CHD or diabetic patients  with > or = 2 CHD risk factors. Marland Kitchen LDL-C is now calculated using the Martin-Hopkins  calculation, which is a validated novel method providing  better accuracy than the Friedewald equation in the  estimation of LDL-C.  Horald Pollen et al. Lenox Ahr. 0102;725(36): 2061-2068  (http://education.[facility].com/faq/FAQ164)    Total CHOL/HDL Ratio 03/22/2023 3.7  <6.4 (calc) Final   Non-HDL Cholesterol (Calc) 03/22/2023 98  <130 mg/dL (calc) Final   Comment: For patients with diabetes plus 1 major ASCVD risk  factor, treating to a non-HDL-C goal of <100 mg/dL  (LDL-C of <40 mg/dL) is considered a therapeutic  option.    Vitamin B-12 03/22/2023 296  200 - 1,100 pg/mL Final   Comment: . Please Note: Although the reference range for vitamin B12 is [phone] pg/mL, it has been reported that between 5 and 10% of patients with values between 200 and 400 pg/mL may experience neuropsychiatric and hematologic abnormalities due to occult B12 deficiency; less than 1% of patients with values above 400 pg/mL will have symptoms. .    Creatinine, Urine 03/22/2023 35  20 - 320 mg/dL Final   Protein/Creat Ratio 03/22/2023 286 (H)  25 - 148 mg/g creat Final   Protein/Creatinine Ratio 03/22/2023 0.286 (H)  0.025 - 0.148 mg/mg creat Final   Total Protein, Urine 03/22/2023 10  5 - 25 mg/dL Final    Immunization History  Administered Date(s) Administered   Influenza,inj,Quad PF,6+ Mos 01/17/2013, 03/06/2014, 03/17/2016, 03/07/2017, 01/18/2018, 01/31/2019, 03/04/2020, 01/27/2021, 02/09/2022   Pneumococcal Polysaccharide-23 10/19/2017   Td 02/08/2004   Tdap 01/06/2011     Past Medical History:  Diagnosis Date   Allergy  Diabetes mellitus without complication (HCC)    Hyperlipidemia    Hypertension    Past Surgical History:  Procedure Laterality Date   COLONOSCOPY  2015   INSERTION OF MESH N/A 10/31/2017   Procedure: INSERTION OF MESH;  Surgeon: Abigail Miyamoto, MD;  Location: [facility];  Service: General;  Laterality: N/A;   UMBILICAL HERNIA REPAIR N/A 10/31/2017   Procedure: UMBILICAL HERNIA REPAIR WITH MESH;  Surgeon: Abigail Miyamoto, MD;  Location: [facility];  Service: General;  Laterality: N/A;   VASECTOMY  2006   Current Outpatient Medications on File Prior to Visit  Medication Sig Dispense Refill   albuterol (VENTOLIN HFA) 108 (90 Base) MCG/ACT inhaler TAKE 2 PUFFS BY MOUTH EVERY 6 HOURS AS NEEDED FOR WHEEZE OR SHORTNESS OF BREATH 8.5 each 1   amLODipine (NORVASC) 10 MG tablet TAKE 1 TABLET BY MOUTH EVERY DAY 90 tablet 1   ascorbic acid (VITAMIN C) 500 MG tablet Take 1 tablet (500 mg total) by mouth daily. 10 tablet 0   aspirin 81 MG tablet Take 81 mg by mouth daily.     atorvastatin (LIPITOR) 40 MG tablet TAKE 1 TABLET BY MOUTH EVERYDAY AT BEDTIME 90 tablet 3   B-D ULTRAFINE III SHORT PEN 31G X 8 MM MISC SMARTSIG:1 Each SUB-Q Daily     blood glucose meter kit and supplies KIT Dispense based on patient and insurance preference. Check fasting blood sugar each morning.  E11.65 1 each 0   Blood Glucose Monitoring Suppl (BLOOD GLUCOSE SYSTEM PAK) KIT Please dispense based on patient and insurance preference. Use as instructed to monitor FSBS 1x daily. Dx E11.9. 1 kit 1   COMBIVENT RESPIMAT 20-100 MCG/ACT AERS respimat INHALE 2 PUFFS INTO THE LUNGS EVERY 6 HOURS AS NEEDED FOR  WHEEZE 4 g 1   Continuous Blood Gluc Sensor (FREESTYLE LIBRE 2 SENSOR) MISC USE AS DIRECTED TO MONITOR BLOOD GLUCOSE. DX: E11.9. 2 each 11   Dulaglutide (TRULICITY) 3 MG/0.5ML SOPN Inject 3 mg as directed once a week. 6 mL 3   erythromycin ophthalmic ointment Place 1 Application into both eyes at bedtime. 3.5 g 2   fluticasone (FLONASE) 50 MCG/ACT nasal spray Place 2 sprays into both nostrils daily. 48 g 11   glipiZIDE (GLUCOTROL XL) 10 MG 24 hr tablet TAKE 1 TABLET BY MOUTH EVERY DAY WITH BREAKFAST 90 tablet 0   insulin glargine, 2 Unit Dial, (TOUJEO MAX SOLOSTAR) 300 UNIT/ML Solostar Pen Inject 60 Units into the skin daily. 18 mL 3   Insulin Pen Needle (B-D ULTRAFINE III SHORT PEN) 31G X 8 MM MISC USE TO INJECT LANTUS INTO THE SKIN DAILY AS NEEDED 100 each 11   JARDIANCE 25 MG TABS tablet TAKE 1 TABLET (25 MG TOTAL) BY MOUTH DAILY. 90 tablet 3   lisinopril-hydrochlorothiazide (ZESTORETIC) 20-12.5 MG tablet TAKE 2 TABLETS BY MOUTH EVERY DAY 180 tablet 0   loratadine (CLARITIN) 10 MG tablet Take 10 mg by mouth daily as needed for allergies.     metFORMIN (GLUCOPHAGE) 1000 MG tablet TAKE 1 TABLET BY MOUTH TWICE A DAY WITH FOOD 180 tablet 1   Multiple Vitamins-Minerals (MULTIVITAMIN PO) Take 1 tablet by mouth daily.      omeprazole (PRILOSEC) 40 MG capsule TAKE 1 CAPSULE (40 MG TOTAL) BY MOUTH DAILY. 30 capsule 0   ondansetron (ZOFRAN) 4 MG tablet Take 1 tablet (4 mg total) by mouth every 8 (eight) hours as needed for nausea or vomiting. 20 tablet 0   OneTouch Delica Lancets 33G MISC  USE AS DIRECTED EVERY DAY 100 each 1   ONETOUCH ULTRA test strip TEST EVERY DAY AS DIRECTED 100 strip 12   sildenafil (VIAGRA) 100 MG tablet TAKE 1/2 TO 1 (ONE-HALF TO ONE) TABLET BY MOUTH ONCE DAILY AS NEEDED FOR ERECTILE DYSFUNCTION 30 tablet 0   triamcinolone (KENALOG) 0.1 % paste Use as directed 1 application in the mouth or throat 2 (two) times daily. 20 g 2   trimethoprim-polymyxin b (POLYTRIM) ophthalmic solution  Place 2 drops into the left eye every 4 (four) hours. 10 mL 0   zinc sulfate 220 (50 Zn) MG capsule Take 1 capsule (220 mg total) by mouth daily. 10 capsule 0   Current Facility-Administered Medications on File Prior to Visit  Medication Dose Route Frequency Provider Last Rate Last Admin   insulin starter kit- pen needles (English) 1 kit  1 kit Other Once Donita Brooks, MD       No Known Allergies Social History   Socioeconomic History   Marital status: Single    Spouse name: Not on file   Number of children: Not on file   Years of education: Not on file   Highest education level: Not on file  Occupational History   Not on file  Tobacco Use   Smoking status: Never   Smokeless tobacco: Never  Vaping Use   Vaping status: Never Used  Substance and Sexual Activity   Alcohol use: No   Drug use: No   Sexual activity: Yes    Comment: married to [place].  Three kids.  exercising daily.  Other Topics Concern   Not on file  Social History Narrative   Not on file   Social Determinants of Health   Financial Resource Strain: Not on file  Food Insecurity: Not on file  Transportation Needs: Not on file  Physical Activity: Not on file  Stress: Not on file  Social Connections: Not on file  Intimate Partner Violence: Not on file   Family History  Problem Relation Age of Onset   Heart disease Father    Stroke Father    Hypertension Sister    Heart disease Mother    Colon cancer Neg Hx       Review of Systems  Respiratory:  Negative for apnea, cough, choking, chest tightness, shortness of breath and wheezing.   Cardiovascular:  Negative for chest pain, palpitations and leg swelling.  Gastrointestinal:  Negative for abdominal distention, abdominal pain and diarrhea.  Genitourinary:  Negative for difficulty urinating, dysuria, frequency and hematuria.  All other systems reviewed and are negative.      Objective:   Physical Exam Vitals reviewed.  Constitutional:       General: He is not in acute distress.    Appearance: Normal appearance. He is well-developed. He is obese. He is not ill-appearing, toxic-appearing or diaphoretic.  HENT:     Head: Normocephalic and atraumatic.     Right Ear: Tympanic membrane, ear canal and external ear normal.     Left Ear: Tympanic membrane, ear canal and external ear normal.     Nose: Nose normal. No congestion or rhinorrhea.     Mouth/Throat:     Mouth: Mucous membranes are moist.     Pharynx: No oropharyngeal exudate or posterior oropharyngeal erythema.  Eyes:     General: No scleral icterus.       Right eye: No discharge.        Left eye: No discharge.     Extraocular Movements:  Extraocular movements intact.     Conjunctiva/sclera: Conjunctivae normal.     Pupils: Pupils are equal, round, and reactive to light.  Neck:     Thyroid: No thyromegaly.     Vascular: No carotid bruit or JVD.     Trachea: No tracheal deviation.  Cardiovascular:     Rate and Rhythm: Normal rate and regular rhythm.     Pulses: Normal pulses.     Heart sounds: Normal heart sounds. No murmur heard.    No friction rub. No gallop.  Pulmonary:     Effort: Pulmonary effort is normal. No respiratory distress.     Breath sounds: Normal breath sounds. No stridor. No wheezing, rhonchi or rales.  Chest:     Chest wall: No tenderness.  Abdominal:     General: Bowel sounds are normal. There is no distension.     Palpations: Abdomen is soft. There is no mass.     Tenderness: There is no abdominal tenderness. There is no guarding or rebound.  Musculoskeletal:        General: No tenderness. Normal range of motion.     Cervical back: Normal range of motion and neck supple.     Right lower leg: No edema.     Left lower leg: No edema.  Lymphadenopathy:     Cervical: No cervical adenopathy.  Skin:    General: Skin is warm.     Coloration: Skin is not jaundiced or pale.     Findings: No bruising, erythema, lesion or rash.  Neurological:      General: No focal deficit present.     Mental Status: He is alert and oriented to person, place, and time. Mental status is at baseline.     Cranial Nerves: No cranial nerve deficit.     Motor: No weakness or abnormal muscle tone.     Coordination: Coordination normal.     Gait: Gait normal.     Deep Tendon Reflexes: Reflexes are normal and symmetric. Reflexes normal.  Psychiatric:        Behavior: Behavior normal.        Thought Content: Thought content normal.        Judgment: Judgment normal.          Assessment & Plan:  Colon cancer screening - Plan: Ambulatory referral to Gastroenterology  Prostate cancer screening - Plan: PSA  Type 2 diabetes mellitus with hyperosmolarity without coma, without long-term current use of insulin (HCC)  Routine general medical examination at a health care facility  Essential hypertension Patient's blood pressure today is excellent.  He received his flu shot as well as a tetanus shot.  He defers the shingles shot.  I will schedule the patient to see GI for his colonoscopy.  I will check a PSA.  His blood sugar is not well-controlled.  I recommended adding rapid acting insulin with his breakfast.  However after having a discussion, the patient would like to discontinue Trulicity and replace it with Mounjaro 5 mg subcu weekly.  We will uptight monthly basis as tolerated.  Hopefully this will help achieve more weight loss and better blood glucose control.  I believe his elevated liver function test are related to his weight.  He has been taking a lot of ibuprofen on a daily basis.  He is having breakthrough reflux.  He has to take ibuprofen due to pain and stiffness in his knees.  Therefore I recommended stopping ibuprofen and switching to Celebrex 200 mg a day due  to less GI toxicity

## 2023-04-04 ENCOUNTER — Other Ambulatory Visit: Payer: Self-pay | Admitting: Family Medicine

## 2023-04-04 LAB — PSA: PSA: 1.05 ng/mL (ref <=4.00)

## 2023-04-06 NOTE — Telephone Encounter (Signed)
Requested Prescriptions  Pending Prescriptions Disp Refills   sildenafil (VIAGRA) 100 MG tablet [Pharmacy Med Name: Sildenafil Citrate 100 MG Oral Tablet] 30 tablet 2    Sig: TAKE 1/2 TO 1 (ONE-HALF TO ONE) TABLET BY MOUTH ONCE DAILY AS NEEDED FOR  ERECTILE  DYSFUNCTION     Urology: Erectile Dysfunction Agents Failed - 04/04/2023 10:48 AM      Failed - ALT in normal range and within 360 days    ALT  Date Value Ref Range Status  03/22/2023 47 (H) 9 - 46 U/L Final         Failed - Valid encounter within last 12 months    Recent Outpatient Visits           1 year ago Type 2 diabetes mellitus with hyperosmolarity without coma, without long-term current use of insulin (HCC)   [facility] Pickard, Priscille Heidelberg, MD   1 year ago Laryngospasm   [facility] Pickard, Priscille Heidelberg, MD   2 years ago Encounter for immunization   [facility] Medicine Pickard, Priscille Heidelberg, MD   2 years ago Upper respiratory tract infection, unspecified type   [facility] Cathlean Marseilles A, NP   2 years ago Type 2 diabetes mellitus with hyperosmolarity without coma, without long-term current use of insulin (HCC)   [facility] Medicine Pickard, Priscille Heidelberg, MD              Passed - AST in normal range and within 360 days    AST  Date Value Ref Range Status  03/22/2023 29 10 - 35 U/L Final         Passed - Last BP in normal range    BP Readings from Last 1 Encounters:  04/03/23 132/86

## 2023-04-07 ENCOUNTER — Other Ambulatory Visit: Payer: Self-pay | Admitting: Family Medicine

## 2023-04-09 ENCOUNTER — Encounter: Payer: Self-pay | Admitting: Internal Medicine

## 2023-04-09 NOTE — Telephone Encounter (Signed)
Requested Prescriptions  Pending Prescriptions Disp Refills   atorvastatin (LIPITOR) 40 MG tablet [Pharmacy Med Name: ATORVASTATIN 40 MG TABLET] 90 tablet 3    Sig: TAKE 1 TABLET BY MOUTH EVERYDAY AT BEDTIME     Cardiovascular:  Antilipid - Statins Failed - 04/07/2023  9:22 AM      Failed - Valid encounter within last 12 months    Recent Outpatient Visits           1 year ago Type 2 diabetes mellitus with hyperosmolarity without coma, without long-term current use of insulin (HCC)   [facility] Medicine Pickard, Priscille Heidelberg, MD   1 year ago Laryngospasm   [facility] Pickard, Priscille Heidelberg, MD   2 years ago Encounter for immunization   [facility] Medicine Pickard, Priscille Heidelberg, MD   2 years ago Upper respiratory tract infection, unspecified type   [facility] Medicine Cathlean Marseilles A, NP   2 years ago Type 2 diabetes mellitus with hyperosmolarity without coma, without long-term current use of insulin (HCC)   [facility] Medicine Pickard, Priscille Heidelberg, MD              Failed - Lipid Panel in normal range within the last 12 months    Cholesterol  Date Value Ref Range Status  03/22/2023 134 <200 mg/dL Final   LDL Cholesterol (Calc)  Date Value Ref Range Status  03/22/2023 74 mg/dL (calc) Final    Comment:    Reference range: <100 . Desirable range <100 mg/dL for primary prevention;   <70 mg/dL for patients with CHD or diabetic patients  with > or = 2 CHD risk factors. Marland Kitchen LDL-C is now calculated using the Martin-Hopkins  calculation, which is a validated novel method providing  better accuracy than the Friedewald equation in the  estimation of LDL-C.  Horald Pollen et al. Lenox Ahr. 0981;191(47): 2061-2068  (http://education.[facility].com/faq/FAQ164)    HDL  Date Value Ref Range Status  03/22/2023 36 (L) > OR = 40 mg/dL Final   Triglycerides  Date Value Ref Range Status  03/22/2023 165 (H) <150 mg/dL Final         Passed -  Patient is not pregnant

## 2023-04-19 ENCOUNTER — Other Ambulatory Visit: Payer: Self-pay | Admitting: Family Medicine

## 2023-04-19 NOTE — Telephone Encounter (Signed)
OV 04/03/23 Requested Prescriptions  Pending Prescriptions Disp Refills   Continuous Glucose Sensor (FREESTYLE LIBRE 2 SENSOR) MISC [Pharmacy Med Name: FREESTYLE LIBRE 2 SENSOR]  11    Sig: USE AS DIRECTED TO MONITOR BLOOD GLUCOSE. DX: E11.9.     Endocrinology: Diabetes - Testing Supplies Failed - 04/19/2023 12:18 PM      Failed - Valid encounter within last 12 months    Recent Outpatient Visits           1 year ago Type 2 diabetes mellitus with hyperosmolarity without coma, without long-term current use of insulin (HCC)   [facility] Medicine Pickard, Priscille Heidelberg, MD   2 years ago Laryngospasm   [facility] Tanya Nones, Priscille Heidelberg, MD   2 years ago Encounter for immunization   [facility] Medicine Pickard, Priscille Heidelberg, MD   2 years ago Upper respiratory tract infection, unspecified type   [facility] Medicine Valentino Nose, NP   2 years ago Type 2 diabetes mellitus with hyperosmolarity without coma, without long-term current use of insulin (HCC)   [facility] Medicine Pickard, Priscille Heidelberg, MD

## 2023-05-23 ENCOUNTER — Ambulatory Visit (AMBULATORY_SURGERY_CENTER): Payer: 59

## 2023-05-23 VITALS — Ht 70.0 in | Wt 225.0 lb

## 2023-05-23 DIAGNOSIS — Z1211 Encounter for screening for malignant neoplasm of colon: Secondary | ICD-10-CM

## 2023-05-23 MED ORDER — SUFLAVE 178.7 G PO SOLR: 1.0000 | ORAL | 0 refills | Status: DC

## 2023-05-23 NOTE — Progress Notes (Signed)
No egg or soy allergy known to patient  No issues known to pt with past sedation with any surgeries or procedures Patient denies ever being told they had issues or difficulty with intubation  No FH of Malignant Hyperthermia Pt is not on diet pills Pt is not on  home 02  Pt is not on blood thinners  Pt denies issues with constipation  No A fib or A flutter Have any cardiac testing pending-- no  LOA: independent  Prep: sulfave    PV competed with patient. Prep instructions sent via mychart and home address.

## 2023-05-29 ENCOUNTER — Encounter: Payer: Self-pay | Admitting: Internal Medicine

## 2023-05-31 ENCOUNTER — Encounter: Payer: Self-pay | Admitting: Family Medicine

## 2023-06-01 ENCOUNTER — Other Ambulatory Visit: Payer: Self-pay | Admitting: Family Medicine

## 2023-06-01 MED ORDER — TIRZEPATIDE 7.5 MG/0.5ML ~~LOC~~ SOAJ: 7.5000 mg | SUBCUTANEOUS | 1 refills | Status: DC

## 2023-06-05 ENCOUNTER — Ambulatory Visit (AMBULATORY_SURGERY_CENTER): Payer: 59 | Admitting: Internal Medicine

## 2023-06-05 ENCOUNTER — Encounter: Payer: Self-pay | Admitting: Internal Medicine

## 2023-06-05 VITALS — BP 120/74 | HR 69 | Temp 98.2°F | Resp 13 | Ht 70.0 in | Wt 225.0 lb

## 2023-06-05 DIAGNOSIS — K635 Polyp of colon: Secondary | ICD-10-CM

## 2023-06-05 DIAGNOSIS — Z1211 Encounter for screening for malignant neoplasm of colon: Secondary | ICD-10-CM

## 2023-06-05 DIAGNOSIS — K5730 Diverticulosis of large intestine without perforation or abscess without bleeding: Secondary | ICD-10-CM | POA: Diagnosis not present

## 2023-06-05 DIAGNOSIS — D123 Benign neoplasm of transverse colon: Secondary | ICD-10-CM

## 2023-06-05 MED ORDER — SODIUM CHLORIDE 0.9 % IV SOLN: 500.0000 mL | Freq: Once | INTRAVENOUS | Status: AC

## 2023-06-05 NOTE — Progress Notes (Signed)
 [facility] History and Physical   Primary Care Physician:  Duanne Butler DASEN, MD   Reason for Procedure:    Encounter Diagnosis  Name Primary?   Special screening for malignant neoplasms, colon Yes     Plan:    Colonoscopy      HPI: Joseph [name] is a 63 y.o. male for repeat screening colonoscopy - 2014 exam was w/o neoplasia.   Past Medical History:  Diagnosis Date   Allergy    Diabetes mellitus without complication (HCC)    Hyperlipidemia    Hypertension     Past Surgical History:  Procedure Laterality Date   COLONOSCOPY  2015   INSERTION OF MESH N/A 10/31/2017   Procedure: INSERTION OF MESH;  Surgeon: Vernetta Berg, MD;  Location: [facility];  Service: General;  Laterality: N/A;   UMBILICAL HERNIA REPAIR N/A 10/31/2017   Procedure: UMBILICAL HERNIA REPAIR WITH MESH;  Surgeon: Vernetta Berg, MD;  Location: [facility];  Service: General;  Laterality: N/A;   VASECTOMY  2006    Prior to Admission medications   Medication Sig Start Date End Date Taking? Authorizing Provider  albuterol  (VENTOLIN  HFA) 108 (90 Base) MCG/ACT inhaler TAKE 2 PUFFS BY MOUTH EVERY 6 HOURS AS NEEDED FOR WHEEZE OR SHORTNESS OF BREATH Patient taking differently: AS NEEDED 10/11/21   Duanne Butler DASEN, MD  amLODipine  (NORVASC ) 10 MG tablet TAKE 1 TABLET BY MOUTH EVERY DAY 01/24/23   Duanne Butler DASEN, MD  aspirin  81 MG tablet Take 81 mg by mouth daily.    [provider]  atorvastatin  (LIPITOR) 40 MG tablet TAKE 1 TABLET BY MOUTH EVERYDAY AT BEDTIME 04/09/23   Duanne Butler DASEN, MD  B-D ULTRAFINE III SHORT PEN 31G X 8 MM MISC SMARTSIG:1 Each SUB-Q Daily 06/09/22   [provider]  blood glucose meter kit and supplies KIT Dispense based on patient and insurance preference. Check fasting blood sugar each morning.  E11.65 08/19/15   Melvenia Ronal NOVAK, PA-C  Blood Glucose Monitoring Suppl (BLOOD GLUCOSE SYSTEM PAK) KIT Please dispense based on patient and insurance preference. Use as  instructed to monitor FSBS 1x daily. Dx E11.9. 04/27/20   Duanne Butler DASEN, MD  celecoxib  (CELEBREX ) 200 MG capsule Take 1 capsule (200 mg total) by mouth daily. 04/03/23   Duanne Butler DASEN, MD  COMBIVENT  RESPIMAT 20-100 MCG/ACT AERS respimat INHALE 2 PUFFS INTO THE LUNGS EVERY 6 HOURS AS NEEDED FOR WHEEZE Patient not taking: Reported on 05/23/2023 05/30/21   Duanne Butler DASEN, MD  Continuous Glucose Sensor (FREESTYLE LIBRE 2 SENSOR) MISC USE AS DIRECTED TO MONITOR BLOOD GLUCOSE. DX: E11.9. 04/19/23   Duanne Butler DASEN, MD  erythromycin  ophthalmic ointment Place 1 Application into both eyes at bedtime. Patient not taking: Reported on 05/23/2023 06/01/22   Duanne Butler DASEN, MD  esomeprazole (NEXIUM) 20 MG capsule Take 20 mg by mouth daily at 12 noon.    [provider]  glipiZIDE  (GLUCOTROL  XL) 10 MG 24 hr tablet TAKE 1 TABLET BY MOUTH EVERY DAY WITH BREAKFAST 01/11/23   Duanne Butler DASEN, MD  insulin  glargine, 2 Unit Dial, (TOUJEO  MAX SOLOSTAR) 300 UNIT/ML Solostar Pen Inject 60 Units into the skin daily. 10/17/22   Duanne Butler DASEN, MD  Insulin  Pen Needle (B-D ULTRAFINE III SHORT PEN) 31G X 8 MM MISC USE TO INJECT LANTUS  INTO THE SKIN DAILY AS NEEDED 10/31/22   Duanne Butler DASEN, MD  JARDIANCE  25 MG TABS tablet TAKE 1 TABLET (25 MG TOTAL) BY MOUTH DAILY. 12/04/22  Duanne Butler DASEN, MD  lisinopril -hydrochlorothiazide  (ZESTORETIC ) 20-12.5 MG tablet TAKE 2 TABLETS BY MOUTH EVERY DAY 03/26/23   Duanne Butler DASEN, MD  loratadine  (CLARITIN ) 10 MG tablet Take 10 mg by mouth daily as needed for allergies.    [provider]  metFORMIN  (GLUCOPHAGE ) 1000 MG tablet TAKE 1 TABLET BY MOUTH TWICE A DAY WITH FOOD 08/28/22   Duanne Butler DASEN, MD  Multiple Vitamins-Minerals (MULTIVITAMIN PO) Take 1 tablet by mouth daily.  Patient not taking: Reported on 05/23/2023    [provider]  niacin (VITAMIN B3) 500 MG tablet Take 1,000 mg by mouth daily at 6 (six) AM.    [provider]   ondansetron  (ZOFRAN ) 4 MG tablet Take 1 tablet (4 mg total) by mouth every 8 (eight) hours as needed for nausea or vomiting. 10/17/22   Duanne Butler DASEN, MD  OneTouch Delica Lancets 33G MISC USE AS DIRECTED EVERY DAY 12/01/20   Duanne Butler DASEN, MD  [facility] ULTRA test strip TEST EVERY DAY AS DIRECTED 06/29/22   Duanne Butler DASEN, MD  PEG 3350 -KCl-NaCl-NaSulf-MgSul (SUFLAVE ) 178.7 g SOLR Take 1 kit by mouth as directed. 05/23/23   Avram Lupita BRAVO, MD  sildenafil  (VIAGRA ) 100 MG tablet TAKE 1/2 TO 1 (ONE-HALF TO ONE) TABLET BY MOUTH ONCE DAILY AS NEEDED FOR  ERECTILE  DYSFUNCTION 04/06/23   Duanne Butler DASEN, MD  tirzepatide  (MOUNJARO ) 7.5 MG/0.5ML Pen Inject 7.5 mg into the skin once a week. 06/01/23   Duanne Butler DASEN, MD  triamcinolone  (NASACORT  ALLERGY 24HR) 55 MCG/ACT AERO nasal inhaler Place 2 sprays into the nose daily.    [provider]  trimethoprim -polymyxin b  (POLYTRIM ) ophthalmic solution Place 2 drops into the left eye every 4 (four) hours. Patient not taking: Reported on 05/23/2023 02/09/22   Duanne Butler DASEN, MD  ZINC -VITAMIN C  PO Take 1 tablet by mouth daily. Plus vitamin D     [provider]    Current Outpatient Medications  Medication Sig Dispense Refill   albuterol  (VENTOLIN  HFA) 108 (90 Base) MCG/ACT inhaler TAKE 2 PUFFS BY MOUTH EVERY 6 HOURS AS NEEDED FOR WHEEZE OR SHORTNESS OF BREATH (Patient taking differently: AS NEEDED) 8.5 each 1   amLODipine  (NORVASC ) 10 MG tablet TAKE 1 TABLET BY MOUTH EVERY DAY 90 tablet 1   aspirin  81 MG tablet Take 81 mg by mouth daily.     atorvastatin  (LIPITOR) 40 MG tablet TAKE 1 TABLET BY MOUTH EVERYDAY AT BEDTIME 90 tablet 3   B-D ULTRAFINE III SHORT PEN 31G X 8 MM MISC SMARTSIG:1 Each SUB-Q Daily     blood glucose meter kit and supplies KIT Dispense based on patient and insurance preference. Check fasting blood sugar each morning.  E11.65 1 each 0   Blood Glucose Monitoring Suppl (BLOOD GLUCOSE SYSTEM PAK) KIT Please dispense  based on patient and insurance preference. Use as instructed to monitor FSBS 1x daily. Dx E11.9. 1 kit 1   celecoxib  (CELEBREX ) 200 MG capsule Take 1 capsule (200 mg total) by mouth daily. 30 capsule 3   COMBIVENT  RESPIMAT 20-100 MCG/ACT AERS respimat INHALE 2 PUFFS INTO THE LUNGS EVERY 6 HOURS AS NEEDED FOR WHEEZE (Patient not taking: Reported on 05/23/2023) 4 g 1   Continuous Glucose Sensor (FREESTYLE LIBRE 2 SENSOR) MISC USE AS DIRECTED TO MONITOR BLOOD GLUCOSE. DX: E11.9. 2 each 11   erythromycin  ophthalmic ointment Place 1 Application into both eyes at bedtime. (Patient not taking: Reported on 05/23/2023) 3.5 g 2   esomeprazole (NEXIUM) 20  MG capsule Take 20 mg by mouth daily at 12 noon.     glipiZIDE  (GLUCOTROL  XL) 10 MG 24 hr tablet TAKE 1 TABLET BY MOUTH EVERY DAY WITH BREAKFAST 90 tablet 0   insulin  glargine, 2 Unit Dial, (TOUJEO  MAX SOLOSTAR) 300 UNIT/ML Solostar Pen Inject 60 Units into the skin daily. 18 mL 3   Insulin  Pen Needle (B-D ULTRAFINE III SHORT PEN) 31G X 8 MM MISC USE TO INJECT LANTUS  INTO THE SKIN DAILY AS NEEDED 100 each 11   JARDIANCE  25 MG TABS tablet TAKE 1 TABLET (25 MG TOTAL) BY MOUTH DAILY. 90 tablet 3   lisinopril -hydrochlorothiazide  (ZESTORETIC ) 20-12.5 MG tablet TAKE 2 TABLETS BY MOUTH EVERY DAY 180 tablet 0   loratadine  (CLARITIN ) 10 MG tablet Take 10 mg by mouth daily as needed for allergies.     metFORMIN  (GLUCOPHAGE ) 1000 MG tablet TAKE 1 TABLET BY MOUTH TWICE A DAY WITH FOOD 180 tablet 1   Multiple Vitamins-Minerals (MULTIVITAMIN PO) Take 1 tablet by mouth daily.  (Patient not taking: Reported on 05/23/2023)     niacin (VITAMIN B3) 500 MG tablet Take 1,000 mg by mouth daily at 6 (six) AM.     ondansetron  (ZOFRAN ) 4 MG tablet Take 1 tablet (4 mg total) by mouth every 8 (eight) hours as needed for nausea or vomiting. 20 tablet 0   OneTouch Delica Lancets 33G MISC USE AS DIRECTED EVERY DAY 100 each 1   ONETOUCH ULTRA test strip TEST EVERY DAY AS DIRECTED 100 strip 12    PEG 3350 -KCl-NaCl-NaSulf-MgSul (SUFLAVE ) 178.7 g SOLR Take 1 kit by mouth as directed. 1 each 0   sildenafil  (VIAGRA ) 100 MG tablet TAKE 1/2 TO 1 (ONE-HALF TO ONE) TABLET BY MOUTH ONCE DAILY AS NEEDED FOR  ERECTILE  DYSFUNCTION 30 tablet 2   tirzepatide  (MOUNJARO ) 7.5 MG/0.5ML Pen Inject 7.5 mg into the skin once a week. 2 mL 1   triamcinolone  (NASACORT  ALLERGY 24HR) 55 MCG/ACT AERO nasal inhaler Place 2 sprays into the nose daily.     trimethoprim -polymyxin b  (POLYTRIM ) ophthalmic solution Place 2 drops into the left eye every 4 (four) hours. (Patient not taking: Reported on 05/23/2023) 10 mL 0   ZINC -VITAMIN C  PO Take 1 tablet by mouth daily. Plus vitamin D      Current Facility-Administered Medications  Medication Dose Route Frequency Provider Last Rate Last Admin   0.9 %  sodium chloride  infusion  500 mL Intravenous Once Avram Lupita BRAVO, MD       insulin  starter kit- pen needles (English) 1 kit  1 kit Other Once Duanne Butler DASEN, MD        Allergies as of 06/05/2023   (No Known Allergies)    Family History  Problem Relation Age of Onset   Heart disease Mother    Heart disease Father    Stroke Father    Hypertension Sister    Colon cancer Neg Hx    Rectal cancer Neg Hx    Stomach cancer Neg Hx     Social History   Socioeconomic History   Marital status: Single    Spouse name: Not on file   Number of children: Not on file   Years of education: Not on file   Highest education level: Not on file  Occupational History   Not on file  Tobacco Use   Smoking status: Never   Smokeless tobacco: Never  Vaping Use   Vaping status: Never Used  Substance and Sexual Activity   Alcohol use:  No   Drug use: No   Sexual activity: Yes    Comment: married to [place].  Three kids.  exercising daily.  Other Topics Concern   Not on file  Social History Narrative   Not on file   Social Drivers of Health   Financial Resource Strain: Not on file  Food Insecurity: Not on file   Transportation Needs: Not on file  Physical Activity: Not on file  Stress: Not on file  Social Connections: Not on file  Intimate Partner Violence: Not on file    Review of Systems:  All other review of systems negative except as mentioned in the HPI.  Physical Exam: Vital signs BP (!) 158/72   Pulse 88   Temp 98.2 F (36.8 C) (Temporal)   Ht 5' 10 (1.778 m)   Wt 225 lb (102.1 kg)   SpO2 98%   BMI 32.28 kg/m   General:   Alert,  Well-developed, well-nourished, pleasant and cooperative in NAD Lungs:  Clear throughout to auscultation.   Heart:  Regular rate and rhythm; no murmurs, clicks, rubs,  or gallops. Abdomen:  Soft, nontender and nondistended. Normal bowel sounds.   Neuro/Psych:  Alert and cooperative. Normal mood and affect. A and O x 3   @Tyia Binford  CHARLENA Commander, MD, [facility] [phone] (pager) 06/05/2023 8:10 AM@

## 2023-06-05 NOTE — Op Note (Signed)
 [facility] Patient Name: Joseph [name] Procedure Date: 06/05/2023 8:38 AM MRN: [id number] Endoscopist: Lupita FORBES Commander , MD, [id number] Age: 63 Referring MD:  Date of Birth: [birth year] Gender: Male Account #: [id number] Procedure:                Colonoscopy Indications:              Screening for colorectal malignant neoplasm, Last                            colonoscopy: 2014 Medicines:                Monitored Anesthesia Care Procedure:                Pre-Anesthesia Assessment:                           - Prior to the procedure, a History and Physical                            was performed, and patient medications and                            allergies were reviewed. The patient's tolerance of                            previous anesthesia was also reviewed. The risks                            and benefits of the procedure and the sedation                            options and risks were discussed with the patient.                            All questions were answered, and informed consent                            was obtained. Prior Anticoagulants: The patient has                            taken no anticoagulant or antiplatelet agents. ASA                            Grade Assessment: II - A patient with mild systemic                            disease. After reviewing the risks and benefits,                            the patient was deemed in satisfactory condition to                            undergo the procedure.  After obtaining informed consent, the colonoscope                            was passed under direct vision. Throughout the                            procedure, the patient's blood pressure, pulse, and                            oxygen saturations were monitored continuously. The                            Olympus Scope SN [id number] was introduced through the                            anus and advanced to the the cecum,  identified by                            appendiceal orifice and ileocecal valve. The                            colonoscopy was performed without difficulty. The                            patient tolerated the procedure well. The quality                            of the bowel preparation was good. The ileocecal                            valve, appendiceal orifice, and rectum were                            photographed. The bowel preparation used was                            suflave  via split dose instruction. Scope In: 8:43:00 AM Scope Out: 8:58:38 AM Scope Withdrawal Time: 0 hours 12 minutes 31 seconds  Total Procedure Duration: 0 hours 15 minutes 38 seconds  Findings:                 The perianal and digital rectal examinations were                            normal.                           A 2 mm polyp was found in the transverse colon. The                            polyp was sessile. The polyp was removed with a                            cold snare. Resection and retrieval were complete.  Verification of patient identification for the                            specimen was done. Estimated blood loss was minimal.                           Multiple diverticula were found in the sigmoid                            colon, descending colon and transverse colon.                           The exam was otherwise without abnormality on                            direct and retroflexion views. Complications:            No immediate complications. Estimated Blood Loss:     Estimated blood loss was minimal. Impression:               - One 2 mm polyp in the transverse colon, removed                            with a cold snare. Resected and retrieved.                           - Diverticulosis in the sigmoid colon, in the                            descending colon and in the transverse colon.                           - The examination was otherwise normal on  direct                            and retroflexion views. Recommendation:           - Patient has a contact number available for                            emergencies. The signs and symptoms of potential                            delayed complications were discussed with the                            patient. Return to normal activities tomorrow.                            Written discharge instructions were provided to the                            patient.                           - Resume previous  diet.                           - Continue present medications.                           - Await pathology results.                           - Repeat colonoscopy is recommended. The                            colonoscopy date will be determined after pathology                            results from today's exam become available for                            review. Lupita FORBES Commander, MD 06/05/2023 9:05:42 AM This report has been signed electronically.

## 2023-06-05 NOTE — Progress Notes (Signed)
Called to procedure room to assist with colonoscopy procedure.

## 2023-06-05 NOTE — Progress Notes (Signed)
 Pt's states no medical or surgical changes since previsit or office visit.

## 2023-06-05 NOTE — Progress Notes (Signed)
 Sedate, gd SR, tolerated procedure well, VSS, report to RN

## 2023-06-05 NOTE — Patient Instructions (Addendum)
 There was a 2 mm polyp removed - looks benign.  You also have a condition called diverticulosis - common and not usually a problem. Please read the handout provided.  I will let you know pathology results and when to have another routine colonoscopy by mail and/or My Chart.  I appreciate the opportunity to care for you. Lupita CHARLENA Commander, MD, FACG  YOU HAD AN ENDOSCOPIC PROCEDURE TODAY AT THE [facility]:   Refer to the procedure report that was given to you for any specific questions about what was found during the examination.  If the procedure report does not answer your questions, please call your gastroenterologist to clarify.  If you requested that your care partner not be given the details of your procedure findings, then the procedure report has been included in a sealed envelope for you to review at your convenience later.  YOU SHOULD EXPECT: Some feelings of bloating in the abdomen. Passage of more gas than usual.  Walking can help get rid of the air that was put into your GI tract during the procedure and reduce the bloating. If you had a lower endoscopy (such as a colonoscopy or flexible sigmoidoscopy) you may notice spotting of blood in your stool or on the toilet paper. If you underwent a bowel prep for your procedure, you may not have a normal bowel movement for a few days.  Please Note:  You might notice some irritation and congestion in your nose or some drainage.  This is from the oxygen used during your procedure.  There is no need for concern and it should clear up in a day or so.  SYMPTOMS TO REPORT IMMEDIATELY:  Following lower endoscopy (colonoscopy or flexible sigmoidoscopy):  Excessive amounts of blood in the stool  Significant tenderness or worsening of abdominal pains  Swelling of the abdomen that is new, acute  Fever of 100F or higher   For urgent or emergent issues, a gastroenterologist can be reached at any hour by calling [phone]. Do not use  MyChart messaging for urgent concerns.    DIET:  We do recommend a small meal at first, but then you may proceed to your regular diet.  Drink plenty of fluids but you should avoid alcoholic beverages for 24 hours.  MEDICATIONS: Continue present medications.  Please see handouts given to you by your recovery nurse: Polyps, Diverticulosis.  FOLLOW UP: Repeat colonoscopy is recommended. The colonoscopy date will determined after pathology results from today's exam become available for review.  Thank you for allowing us  to provide for your healthcare needs today.  ACTIVITY:  You should plan to take it easy for the rest of today and you should NOT DRIVE or use heavy machinery until tomorrow (because of the sedation medicines used during the test).    FOLLOW UP: Our staff will call the number listed on your records the next business day following your procedure.  We will call around 7:15- 8:00 am to check on you and address any questions or concerns that you may have regarding the information given to you following your procedure. If we do not reach you, we will leave a message.     If any biopsies were taken you will be contacted by phone or by letter within the next 1-3 weeks.  Please call us  at [phone] if you have not heard about the biopsies in 3 weeks.    SIGNATURES/CONFIDENTIALITY: You and/or your care partner have signed paperwork which will be entered  into your electronic medical record.  These signatures attest to the fact that that the information above on your After Visit Summary has been reviewed and is understood.  Full responsibility of the confidentiality of this discharge information lies with you and/or your care-partner.

## 2023-06-06 ENCOUNTER — Telehealth: Payer: Self-pay | Admitting: *Deleted

## 2023-06-06 NOTE — Telephone Encounter (Signed)
 l Follow up Call-  Left message on f/u call

## 2023-06-07 ENCOUNTER — Encounter: Payer: Self-pay | Admitting: Internal Medicine

## 2023-06-07 LAB — SURGICAL PATHOLOGY

## 2023-06-22 ENCOUNTER — Other Ambulatory Visit: Payer: Self-pay

## 2023-06-22 DIAGNOSIS — E1165 Type 2 diabetes mellitus with hyperglycemia: Secondary | ICD-10-CM

## 2023-06-22 MED ORDER — METFORMIN HCL 1000 MG PO TABS: ORAL_TABLET | ORAL | 0 refills | Status: DC

## 2023-06-25 ENCOUNTER — Telehealth: Payer: Self-pay | Admitting: Family Medicine

## 2023-06-25 ENCOUNTER — Other Ambulatory Visit: Payer: Self-pay

## 2023-06-25 MED ORDER — LISINOPRIL-HYDROCHLOROTHIAZIDE 20-12.5 MG PO TABS: 2.0000 | ORAL_TABLET | Freq: Every day | ORAL | 0 refills | Status: DC

## 2023-06-25 NOTE — Telephone Encounter (Signed)
 Prescription Request  06/25/2023  LOV: 04/03/2023  What is the name of the medication or equipment?   lisinopril-hydrochlorothiazide (ZESTORETIC) 20-12.5 MG tablet   Have you contacted your pharmacy to request a refill? Yes   Which pharmacy would you like this sent to?  [id number] [place], [place] - [address] [place] [address] Phone: [phone] Fax: [phone]    Patient notified that their request is being sent to the clinical staff for review and that they should receive a response within 2 business days.   Please advise pharmacist.

## 2023-07-01 ENCOUNTER — Other Ambulatory Visit: Payer: Self-pay | Admitting: Family Medicine

## 2023-07-01 DIAGNOSIS — E1165 Type 2 diabetes mellitus with hyperglycemia: Secondary | ICD-10-CM

## 2023-07-06 ENCOUNTER — Encounter: Payer: Self-pay | Admitting: Internal Medicine

## 2023-07-06 ENCOUNTER — Ambulatory Visit: Payer: Self-pay | Admitting: Family Medicine

## 2023-07-06 ENCOUNTER — Ambulatory Visit (INDEPENDENT_AMBULATORY_CARE_PROVIDER_SITE_OTHER): Admitting: Internal Medicine

## 2023-07-06 VITALS — BP 150/82 | HR 85 | Ht 68.0 in | Wt 231.2 lb

## 2023-07-06 DIAGNOSIS — L040 Acute lymphadenitis of face, head and neck: Secondary | ICD-10-CM | POA: Diagnosis not present

## 2023-07-06 MED ORDER — PREDNISONE 10 MG (21) PO TBPK
ORAL_TABLET | ORAL | 0 refills | Status: DC
Start: 2023-07-06 — End: 2024-01-21

## 2023-07-06 MED ORDER — AMOXICILLIN 875 MG PO TABS: 875.0000 mg | ORAL_TABLET | Freq: Two times a day (BID) | ORAL | 0 refills | Status: AC

## 2023-07-06 NOTE — Assessment & Plan Note (Signed)
 Presenting today for an acute visit endorsing neck pain neck mass as noted above.  There is a palpable, tender mass in the left superficial cervical lymph node region, favoring cervical adenitis.  Treatment options reviewed.  Amoxicillin 875 mg twice daily x 5 days prescribed for empiric antibiotic coverage.  Will also add prednisone pack as Joseph [name] will be traveling to [place] early next week to watch his son graduate from [employer].  Potential red flag symptoms warranting ED presentation over the weekend were reviewed.  He will otherwise return to care for Dr. Tanya Nones for ongoing primary care.

## 2023-07-06 NOTE — Patient Instructions (Signed)
 It was a pleasure to see you today.  Thank you for giving [place] the opportunity to be involved in your care.  Below is a brief recap of your visit and next steps.   Summary Amoxicillin and prednisone taper prescribed today for cervical adenitis. Follow up with Dr. Tanya Nones if symptoms worsen or do not improve

## 2023-07-06 NOTE — Progress Notes (Signed)
   Acute Office Visit  Subjective:     Patient ID: Joseph [name], male    DOB: 08-Jul-1960, 63 y.o.   MRN: [id number]  Chief Complaint  Patient presents with   lymph     Lymph node swollen and sore   Mr. Othman presents today for acute visit for evaluation of a painful, swollen mass on the left side of his neck present for the last day.  Denies recent upper respiratory tract, ear, or dental infections. Pain is not worsened by chewing or swallowing. He applied an over-the-counter topical cream last night that brought some degree of symptom relief.  Denies fever/chills, shortness of breath, and states that this has not happened previously.  Review of Systems  Constitutional:  Negative for chills, diaphoresis, fever and malaise/fatigue.  HENT:  Negative for congestion, ear pain, sinus pain, sore throat and tinnitus.        Painful mass, left side of neck  Respiratory:  Negative for cough and sputum production.   Skin:  Negative for rash.      Objective:    BP (!) 150/82 (BP Location: Left Arm, Patient Position: Sitting, Cuff Size: Large)   Pulse 85   Ht 5\' 8"  (1.727 m)   Wt 231 lb 3.2 oz (104.9 kg)   SpO2 95%   BMI 35.15 kg/m    Physical Exam Vitals reviewed.  Constitutional:      General: He is not in acute distress.    Appearance: Normal appearance. He is not toxic-appearing.  HENT:     Head:     Comments: Tender mass along left superficial cervical lymph node chain, no overlying erythema     Left Ear: Tympanic membrane, ear canal and external ear normal.     Nose: Nose normal.     Mouth/Throat:     Mouth: Mucous membranes are moist.     Pharynx: Oropharynx is clear. No oropharyngeal exudate or posterior oropharyngeal erythema.  Neurological:     Mental Status: He is alert.       Assessment & Plan:   Problem List Items Addressed This Visit       Acute cervical adenitis - Primary   Presenting today for an acute visit endorsing left sided neck pain and mass as noted  above.  There is a palpable, tender mass in the left superficial cervical lymph node region, favoring cervical adenitis.  Treatment options reviewed.  Amoxicillin 875 mg twice daily x 5 days prescribed for empiric antibiotic coverage.  Will also add prednisone pack as Mr. Ellers will be traveling to [place] early next week to watch his son graduate from [employer].  Potential red flag symptoms warranting ED presentation over the weekend were reviewed.  He will otherwise return to care for Dr. Tanya Nones for ongoing primary care.       Meds ordered this encounter  Medications   amoxicillin (AMOXIL) 875 MG tablet    Sig: Take 1 tablet (875 mg total) by mouth 2 (two) times daily for 5 days.    Dispense:  10 tablet    Refill:  0   predniSONE (STERAPRED UNI-PAK 21 TAB) 10 MG (21) TBPK tablet    Sig: Use as directed.    Dispense:  21 each    Refill:  0    Return if symptoms worsen or fail to improve.  Billie Lade, MD

## 2023-07-06 NOTE — Telephone Encounter (Signed)
 Copied from CRM [id number]. Topic: Clinical - Red Word Triage >> Jul 06, 2023 10:19 AM Marlow Baars wrote: Red Word that prompted transfer to Nurse Triage: The patient called in stating he has been dealing with a swollen gland on the side of his throat/neck. He states it is only on one side and it has gotten worse with the swelling. His providers office is closed and with the fact he is getting worse I will transfer him to [facility]  Chief Complaint: Swollen node Symptoms: Pain Frequency: 3-4 days Pertinent Negatives: Patient denies difficulty breathing Disposition: [] ED /[] Urgent Care (no appt availability in office) / [x] Appointment(In office/virtual)/ []  [facility] [place] [facility]/ [] Home Care/ [] Refused Recommended Disposition /[] [facility]/ []  Follow-up with PCP Additional Notes: Patient called in to report a swollen node on the left side of his neck. Patient stated node is painful to the touch and greater than an inch in size. Patient denied fever and cold symptoms. This RN advised patient to see a provider within 24 hours, per protocol. No availability with PCP/PCP office. Scheduled same day appointment with provider at an alternate office. This RN advised patient to call back if symptoms worsen. Patient complied.   Reason for Disposition  [1] Single large node AND [2] size > 1 inch (2.5 cm) AND [3] no fever  Answer Assessment - Initial Assessment Questions 1. LOCATION: "Where is the swollen node located?" "Is the matching node on the other side of the body also swollen?"      Left side of neck/jaw area 2. SIZE: "How big is the node?" (e.g., inches or centimeters; or compared to common objects such as pea, bean, marble, golf ball)      "Marble to a rubber ball" 3. ONSET: "When did the swelling start?"      3-4 days ago, states swelling is increasing daily 4. NECK NODES: "Is there a sore throat, runny nose or other symptoms of a cold?"      Denies 5. GROIN OR ARMPIT NODES: "Is there a  sore, scratch, cut or painful red area on that arm or leg?"      Denies 6. FEVER: "Do you have a fever?" If Yes, ask: "What is it, how was it measured, and when did it start?"      Denies 7. CAUSE: "What do you think is causing the swollen lymph nodes?"     Unknown 8. OTHER SYMPTOMS: "Do you have any other symptoms?"     "Sore" pain (rates pain a 4 or 5 when touching), denies redness  Protocols used: Lymph Nodes - Swollen-A-AH

## 2023-07-15 ENCOUNTER — Encounter: Payer: Self-pay | Admitting: Family Medicine

## 2023-07-22 ENCOUNTER — Other Ambulatory Visit: Payer: Self-pay | Admitting: Family Medicine

## 2023-07-24 NOTE — Telephone Encounter (Signed)
 Requested Prescriptions  Pending Prescriptions Disp Refills   celecoxib (CELEBREX) 200 MG capsule [Pharmacy Med Name: CELECOXIB 200 MG CAPSULE] 30 capsule 3    Sig: TAKE 1 CAPSULE BY MOUTH EVERY DAY     Analgesics:  COX2 Inhibitors Failed - 07/24/2023  8:25 AM      Failed - Manual Review: Labs are only required if the patient has taken medication for more than 8 weeks.      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, without long-term current use of insulin (HCC)   [facility] Pickard, Priscille Heidelberg, MD   2 years ago Laryngospasm   [facility] Donita Brooks, MD   2 years ago Encounter for immunization   [facility] Pickard, Priscille Heidelberg, MD   2 years ago Upper respiratory tract infection, unspecified type   [facility] Valentino Nose, NP   3 years ago Type 2 diabetes mellitus with hyperosmolarity without coma, without long-term current use of insulin (HCC)   [facility] Pickard, Priscille Heidelberg, MD              Passed - HGB in normal range and within 360 days    Hemoglobin  Date Value Ref Range Status  03/22/2023 16.7 13.2 - 17.1 g/dL Final         Passed - Cr in normal range and within 360 days    Creat  Date Value Ref Range Status  03/22/2023 0.95 0.70 - 1.35 mg/dL Final   Creatinine, Urine  Date Value Ref Range Status  03/22/2023 35 20 - 320 mg/dL Final         Passed - AST in normal range and within 360 days    AST  Date Value Ref Range Status  03/22/2023 29 10 - 35 U/L Final         Passed - eGFR is 30 or  above and within 360 days    GFR, Est African American  Date Value Ref Range Status  12/16/2019 86 > OR = 60 mL/min/1.79m2 Final   GFR, Est Non African American  Date Value Ref Range Status  12/16/2019 74 > OR = 60 mL/min/1.33m2 Final   eGFR  Date Value Ref Range Status  03/22/2023 92 > OR = 60 mL/min/1.4m2 Final         Passed - Patient is not pregnant

## 2023-07-25 ENCOUNTER — Telehealth: Payer: Self-pay | Admitting: Family Medicine

## 2023-07-25 ENCOUNTER — Other Ambulatory Visit: Payer: Self-pay

## 2023-07-25 MED ORDER — AMLODIPINE BESYLATE 10 MG PO TABS
10.0000 mg | ORAL_TABLET | Freq: Every day | ORAL | 1 refills | Status: DC
Start: 2023-07-25 — End: 2024-01-07

## 2023-07-25 NOTE — Telephone Encounter (Signed)
 Prescription Request  07/25/2023  LOV: 04/03/2023  What is the name of the medication or equipment?   amLODipine (NORVASC) 10 MG tablet   Have you contacted your pharmacy to request a refill? Yes   Which pharmacy would you like this sent to?  [id number] [place], [place] - [address] [place] [address] Phone: [phone] Fax: [phone]    Patient notified that their request is being sent to the clinical staff for review and that they should receive a response within 2 business days.   Please advise pharmacist.

## 2023-08-12 ENCOUNTER — Encounter: Payer: Self-pay | Admitting: Family Medicine

## 2023-08-13 ENCOUNTER — Other Ambulatory Visit: Payer: Self-pay

## 2023-08-13 MED ORDER — TIRZEPATIDE 10 MG/0.5ML ~~LOC~~ SOAJ: 10.0000 mg | SUBCUTANEOUS | 0 refills | Status: DC

## 2023-08-28 ENCOUNTER — Other Ambulatory Visit: Payer: Self-pay | Admitting: Family Medicine

## 2023-08-28 NOTE — Telephone Encounter (Signed)
 Prescription Request  08/28/2023  LOV: 04/03/2023  What is the name of the medication or equipment? glipiZIDE  (GLUCOTROL  XL) 10 MG 24 hr tablet   Have you contacted your pharmacy to request a refill? Yes   Which pharmacy would you like this sent to?  [id number] [place], [place] - [address] [place] [address] Phone: [phone] Fax: [phone]    Patient notified that their request is being sent to the clinical staff for review and that they should receive a response within 2 business days.   Please advise at [facility] [phone]

## 2023-08-29 ENCOUNTER — Other Ambulatory Visit: Payer: Self-pay | Admitting: Family Medicine

## 2023-08-29 MED ORDER — GLIPIZIDE ER 10 MG PO TB24: 10.0000 mg | ORAL_TABLET | Freq: Every day | ORAL | 2 refills | Status: DC

## 2023-08-30 NOTE — Telephone Encounter (Signed)
 Duplicate request. Rx reordered 08/29/23 #90, 2 refills Requested Prescriptions  Pending Prescriptions Disp Refills   glipiZIDE  (GLUCOTROL  XL) 10 MG 24 hr tablet 90 tablet 0     Endocrinology:  Diabetes - Sulfonylureas Failed - 08/30/2023  1:15 PM      Failed - HBA1C is between 0 and 7.9 and within 180 days    Hgb A1C (fingerstick)  Date Value Ref Range Status  08/18/2015 11.7 (H) <5.7 % Final    Comment:                                                                           According to the ADA Clinical Practice Recommendations for 2011, when HbA1c is used as a screening test:     >=6.5%   Diagnostic of Diabetes Mellitus            (if abnormal result is confirmed)   5.7-6.4%   Increased risk of developing Diabetes Mellitus   References:Diagnosis and Classification of Diabetes Mellitus,Diabetes Care,2011,34(Suppl 1):S62-S69 and Standards of Medical Care in         Diabetes - 2011,Diabetes Care,2011,34 (Suppl 1):S11-S61.      Hgb A1c MFr Bld  Date Value Ref Range Status  03/22/2023 8.1 (H) <5.7 % of total Hgb Final    Comment:    For someone without known diabetes, a hemoglobin A1c value of 6.5% or greater indicates that they may have  diabetes and this should be confirmed with a follow-up  test. . For someone with known diabetes, a value <7% indicates  that their diabetes is well controlled and a value  greater than or equal to 7% indicates suboptimal  control. A1c targets should be individualized based on  duration of diabetes, age, comorbid conditions, and  other considerations. . Currently, no consensus exists regarding use of hemoglobin A1c for diagnosis of diabetes for children. .          Passed - Cr in normal range and within 360 days    Creat  Date Value Ref Range Status  03/22/2023 0.95 0.70 - 1.35 mg/dL Final   Creatinine, Urine  Date Value Ref Range Status  03/22/2023 35 20 - 320 mg/dL Final         Passed - Valid encounter within last 6 months     Recent Outpatient Visits           4 months ago Colon cancer screening   [facility] Austine Lefort, MD   10 months ago Type 2 diabetes mellitus with hyperosmolarity without coma, without long-term current use of insulin  [facility])   [facility] Pickard, Cisco Crest, MD   1 year ago 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, unspecified type   [facility] Austine Lefort, MD   1 year ago Routine general medical examination at a health care facility   [facility] Pickard, Cisco Crest, MD

## 2023-09-20 ENCOUNTER — Other Ambulatory Visit: Payer: Self-pay | Admitting: Family Medicine

## 2023-09-25 ENCOUNTER — Other Ambulatory Visit: Payer: Self-pay

## 2023-09-25 DIAGNOSIS — E1165 Type 2 diabetes mellitus with hyperglycemia: Secondary | ICD-10-CM

## 2023-09-25 MED ORDER — FREESTYLE LIBRE 2 SENSOR MISC: 11 refills | Status: DC

## 2023-09-28 ENCOUNTER — Other Ambulatory Visit: Payer: Self-pay | Admitting: Family Medicine

## 2023-09-29 NOTE — Telephone Encounter (Signed)
 Requested medication (s) are due for refill today: yes  Requested medication (s) are on the active medication list: yes  Last refill:  10/17/22 #18/3  Future visit scheduled: no  Notes to clinic:  Unable to refill per protocol due to failed labs, no updated results.      Requested Prescriptions  Pending Prescriptions Disp Refills   TOUJEO  MAX SOLOSTAR 300 UNIT/ML Solostar Pen [Pharmacy Med Name: TOUJEO  MAX SOLOSTAR PEN 3ML2S 300U/ML] 18 mL 3    Sig: INJECT 60 UNITS UNDER THE SKIN DAILY     Endocrinology:  Diabetes - Insulins Failed - 09/29/2023  2:41 PM      Failed - HBA1C is between 0 and 7.9 and within 180 days    Hgb A1C (fingerstick)  Date Value Ref Range Status  08/18/2015 11.7 (H) <5.7 % Final    Comment:                                                                           According to the ADA Clinical Practice Recommendations for 2011, when HbA1c is used as a screening test:     >=6.5%   Diagnostic of Diabetes Mellitus            (if abnormal result is confirmed)   5.7-6.4%   Increased risk of developing Diabetes Mellitus   References:Diagnosis and Classification of Diabetes Mellitus,Diabetes Care,2011,34(Suppl 1):S62-S69 and Standards of Medical Care in         Diabetes - 2011,Diabetes Care,2011,34 (Suppl 1):S11-S61.      Hgb A1c MFr Bld  Date Value Ref Range Status  03/22/2023 8.1 (H) <5.7 % of total Hgb Final    Comment:    For someone without known diabetes, a hemoglobin A1c value of 6.5% or greater indicates that they may have  diabetes and this should be confirmed with a follow-up  test. . For someone with known diabetes, a value <7% indicates  that their diabetes is well controlled and a value  greater than or equal to 7% indicates suboptimal  control. A1c targets should be individualized based on  duration of diabetes, age, comorbid conditions, and  other considerations. . Currently, no consensus exists regarding use of hemoglobin A1c for  diagnosis of diabetes for children. Temple Feeler - Valid encounter within last 6 months    Recent Outpatient Visits           5 months ago Colon cancer screening   [facility] Austine Lefort, MD   11 months ago Type 2 diabetes mellitus with hyperosmolarity without coma, without long-term current use of insulin  [facility])   [facility] Pickard, Cisco Crest, MD   1 year ago 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, unspecified type   [facility] Austine Lefort, MD   1 year ago Routine general medical examination at a health care facility   [facility] Pickard, Cisco Crest, MD

## 2023-10-03 NOTE — Telephone Encounter (Signed)
 Medication has been approved and sent in to pharmacy on file by PCP.

## 2023-10-14 ENCOUNTER — Other Ambulatory Visit: Payer: Self-pay | Admitting: Family Medicine

## 2023-11-06 ENCOUNTER — Other Ambulatory Visit: Payer: Self-pay | Admitting: Family Medicine

## 2023-11-19 ENCOUNTER — Other Ambulatory Visit: Payer: Self-pay | Admitting: Family Medicine

## 2023-11-19 NOTE — Telephone Encounter (Signed)
 Prescription Request  11/19/2023  LOV: 04/03/2023  What is the name of the medication or equipment?   celecoxib  (CELEBREX ) 200 MG capsule   Have you contacted your pharmacy to request a refill? Yes   Which pharmacy would you like this sent to?  [id number] [place], [place] - [address] [place] [address] Phone: [phone] Fax: [phone]    Patient notified that their request is being sent to the clinical staff for review and that they should receive a response within 2 business days.   Please advise pharmacist.

## 2023-11-21 ENCOUNTER — Other Ambulatory Visit: Payer: Self-pay | Admitting: Family Medicine

## 2023-11-21 MED ORDER — CELECOXIB 200 MG PO CAPS: 200.0000 mg | ORAL_CAPSULE | Freq: Every day | ORAL | 2 refills | Status: DC

## 2023-11-21 NOTE — Telephone Encounter (Signed)
 Requested Prescriptions  Pending Prescriptions Disp Refills   celecoxib  (CELEBREX ) 200 MG capsule 30 capsule 2    Sig: Take 1 capsule (200 mg total) by mouth daily.     Analgesics:  COX2 Inhibitors Failed - 11/21/2023  1:02 PM      Failed - Manual Review: Labs are only required if the patient has taken medication for more than 8 weeks.      Failed - HCT in normal range and within 360 days    HCT  Date Value Ref Range Status  03/22/2023 51.2 (H) 38.5 - 50.0 % Final         Failed - ALT in normal range and within 360 days    ALT  Date Value Ref Range Status  03/22/2023 47 (H) 9 - 46 U/L Final         Passed - HGB in normal range and within 360 days    Hemoglobin  Date Value Ref Range Status  03/22/2023 16.7 13.2 - 17.1 g/dL Final         Passed - Cr in normal range and within 360 days    Creat  Date Value Ref Range Status  03/22/2023 0.95 0.70 - 1.35 mg/dL Final   Creatinine, Urine  Date Value Ref Range Status  03/22/2023 35 20 - 320 mg/dL Final         Passed - AST in normal range and within 360 days    AST  Date Value Ref Range Status  03/22/2023 29 10 - 35 U/L Final         Passed - eGFR is 30 or above and within 360 days    GFR, Est African American  Date Value Ref Range Status  12/16/2019 86 > OR = 60 mL/min/1.24m2 Final   GFR, Est Non African American  Date Value Ref Range Status  12/16/2019 74 > OR = 60 mL/min/1.41m2 Final   eGFR  Date Value Ref Range Status  03/22/2023 92 > OR = 60 mL/min/1.72m2 Final         Passed - Patient is not pregnant      Passed - Valid encounter within last 12 months    Recent Outpatient Visits           7 months ago Colon cancer screening   [facility] Duanne Butler DASEN, MD   1 year ago Type 2 diabetes mellitus with hyperosmolarity without coma, without long-term current use of insulin  [facility])   [facility] Pickard, Butler DASEN, MD   1 year ago Blepharitis of lower  eyelids of both eyes, unspecified type   [facility] Duanne Butler DASEN, MD   1 year ago Routine general medical examination at a health care facility   [facility] Pickard, Butler DASEN, MD

## 2023-11-21 NOTE — Telephone Encounter (Signed)
 Prescription Request  11/21/2023  LOV: 04/03/2023  What is the name of the medication or equipment?   JARDIANCE  25 MG TABS tablet  **90 day script requested**  Have you contacted your pharmacy to request a refill? Yes   Which pharmacy would you like this sent to?  [id number] [place], [place] - [address] [place] [address] Phone: [phone] Fax: [phone]    Patient notified that their request is being sent to the clinical staff for review and that they should receive a response within 2 business days.   Please advise pharmacist.

## 2023-11-23 MED ORDER — EMPAGLIFLOZIN 25 MG PO TABS: 25.0000 mg | ORAL_TABLET | Freq: Every day | ORAL | 0 refills | Status: DC

## 2023-11-23 NOTE — Telephone Encounter (Signed)
 Requested Prescriptions  Pending Prescriptions Disp Refills   empagliflozin  (JARDIANCE ) 25 MG TABS tablet 90 tablet 0    Sig: Take 1 tablet (25 mg total) by mouth daily.     Endocrinology:  Diabetes - SGLT2 Inhibitors Failed - 11/23/2023  7:59 AM      Failed - HBA1C is between 0 and 7.9 and within 180 days    Hgb A1C (fingerstick)  Date Value Ref Range Status  08/18/2015 11.7 (H) <5.7 % Final    Comment:                                                                           According to the ADA Clinical Practice Recommendations for 2011, when HbA1c is used as a screening test:     >=6.5%   Diagnostic of Diabetes Mellitus            (if abnormal result is confirmed)   5.7-6.4%   Increased risk of developing Diabetes Mellitus   References:Diagnosis and Classification of Diabetes Mellitus,Diabetes Care,2011,34(Suppl 1):S62-S69 and Standards of Medical Care in         Diabetes - 2011,Diabetes Care,2011,34 (Suppl 1):S11-S61.      Hgb A1c MFr Bld  Date Value Ref Range Status  03/22/2023 8.1 (H) <5.7 % of total Hgb Final    Comment:    For someone without known diabetes, a hemoglobin A1c value of 6.5% or greater indicates that they may have  diabetes and this should be confirmed with a follow-up  test. . For someone with known diabetes, a value <7% indicates  that their diabetes is well controlled and a value  greater than or equal to 7% indicates suboptimal  control. A1c targets should be individualized based on  duration of diabetes, age, comorbid conditions, and  other considerations. . Currently, no consensus exists regarding use of hemoglobin A1c for diagnosis of diabetes for children. .          Failed - Valid encounter within last 6 months    Recent Outpatient Visits           7 months ago Colon cancer screening   [place] Duanne Butler DASEN, MD   1 year ago Type 2 diabetes mellitus with hyperosmolarity without coma, without  long-term current use of insulin  [facility])   [facility] Pickard, Butler DASEN, MD   1 year ago 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, unspecified type   [place] [facility] Duanne Butler DASEN, MD   1 year ago Routine general medical examination at a health care facility   [facility] Duanne Butler DASEN, MD              Passed - Cr in normal range and within 360 days    Creat  Date Value Ref Range Status  03/22/2023 0.95 0.70 - 1.35 mg/dL Final   Creatinine, Urine  Date Value Ref Range Status  03/22/2023 35 20 - 320 mg/dL Final         Passed - eGFR in normal range and within 360 days    GFR, Est African American  Date Value Ref Range Status  12/16/2019 86 > OR = 60 mL/min/1.6m2 Final   GFR, Est Non African American  Date Value Ref Range Status  12/16/2019 74 > OR = 60 mL/min/1.59m2 Final   eGFR  Date Value Ref Range Status  03/22/2023 92 > OR = 60 mL/min/1.57m2 Final

## 2023-12-17 ENCOUNTER — Other Ambulatory Visit: Payer: Self-pay | Admitting: Family Medicine

## 2023-12-17 NOTE — Telephone Encounter (Signed)
 Prescription Request  12/17/2023  LOV: 04/03/2023  What is the name of the medication or equipment?   lisinopril -hydrochlorothiazide  (ZESTORETIC ) 20-12.5 MG tablet [[id number]] SIG: TAKE 2 TABLETS BY MOUTH EVERY DAY  **90 day script requested**  Have you contacted your pharmacy to request a refill? Yes   Which pharmacy would you like this sent to?  [id number] [place], [place] - [address] [place] [address] Phone: [phone] Fax: [phone]    Patient notified that their request is being sent to the clinical staff for review and that they should receive a response within 2 business days.   Please advise pharmacist.

## 2023-12-19 NOTE — Telephone Encounter (Signed)
 Requested medication (s) are due for refill today - yes  Requested medication (s) are on the active medication list -yes  Future visit scheduled -no  Last refill: 09/20/23 #180  Notes to clinic: fails lab protocol, last BP reading on file elevated - sent for review   Requested Prescriptions  Pending Prescriptions Disp Refills   lisinopril -hydrochlorothiazide  (ZESTORETIC ) 20-12.5 MG tablet 180 tablet 0    Sig: Take 2 tablets by mouth daily.     Cardiovascular:  ACEI + Diuretic Combos Failed - 12/19/2023 12:42 PM      Failed - Na in normal range and within 180 days    Sodium  Date Value Ref Range Status  03/22/2023 139 135 - 146 mmol/L Final         Failed - K in normal range and within 180 days    Potassium  Date Value Ref Range Status  03/22/2023 3.4 (L) 3.5 - 5.3 mmol/L Final         Failed - Cr in normal range and within 180 days    Creat  Date Value Ref Range Status  03/22/2023 0.95 0.70 - 1.35 mg/dL Final   Creatinine, Urine  Date Value Ref Range Status  03/22/2023 35 20 - 320 mg/dL Final         Failed - eGFR is 30 or above and within 180 days    GFR, Est African American  Date Value Ref Range Status  12/16/2019 86 > OR = 60 mL/min/1.79m2 Final   GFR, Est Non African American  Date Value Ref Range Status  12/16/2019 74 > OR = 60 mL/min/1.23m2 Final   eGFR  Date Value Ref Range Status  03/22/2023 92 > OR = 60 mL/min/1.32m2 Final         Failed - Last BP in normal range    BP Readings from Last 1 Encounters:  07/06/23 (!) 150/82         Failed - Valid encounter within last 6 months    Recent Outpatient Visits           8 months ago Colon cancer screening   [place] [facility] Duanne Butler DASEN, MD   1 year ago Type 2 diabetes mellitus with hyperosmolarity without coma, without long-term current use of insulin  [facility])   [place] [facility] Duanne Butler DASEN, MD   1 year ago Blepharitis of lower eyelids of both  eyes, unspecified type   [place] [facility] Duanne Butler DASEN, MD   1 year ago Routine general medical examination at a health care facility   [place] [facility] Duanne Butler DASEN, MD              Passed - Patient is not pregnant         Requested Prescriptions  Pending Prescriptions Disp Refills   lisinopril -hydrochlorothiazide  (ZESTORETIC ) 20-12.5 MG tablet 180 tablet 0    Sig: Take 2 tablets by mouth daily.     Cardiovascular:  ACEI + Diuretic Combos Failed - 12/19/2023 12:42 PM      Failed - Na in normal range and within 180 days    Sodium  Date Value Ref Range Status  03/22/2023 139 135 - 146 mmol/L Final         Failed - K in normal range and within 180 days    Potassium  Date Value Ref Range Status  03/22/2023 3.4 (L) 3.5 - 5.3 mmol/L Final  Failed - Cr in normal range and within 180 days    Creat  Date Value Ref Range Status  03/22/2023 0.95 0.70 - 1.35 mg/dL Final   Creatinine, Urine  Date Value Ref Range Status  03/22/2023 35 20 - 320 mg/dL Final         Failed - eGFR is 30 or above and within 180 days    GFR, Est African American  Date Value Ref Range Status  12/16/2019 86 > OR = 60 mL/min/1.59m2 Final   GFR, Est Non African American  Date Value Ref Range Status  12/16/2019 74 > OR = 60 mL/min/1.33m2 Final   eGFR  Date Value Ref Range Status  03/22/2023 92 > OR = 60 mL/min/1.33m2 Final         Failed - Last BP in normal range    BP Readings from Last 1 Encounters:  07/06/23 (!) 150/82         Failed - Valid encounter within last 6 months    Recent Outpatient Visits           8 months ago Colon cancer screening   [place] [facility] Duanne Butler DASEN, MD   1 year ago Type 2 diabetes mellitus with hyperosmolarity without coma, without long-term current use of insulin  [facility])   [place] [facility] Pickard, Butler DASEN, MD   1 year ago Blepharitis of  lower eyelids of both eyes, unspecified type   [place] [facility] Duanne Butler DASEN, MD   1 year ago Routine general medical examination at a health care facility   [facility] Pickard, Butler DASEN, MD              Passed - Patient is not pregnant

## 2023-12-20 ENCOUNTER — Other Ambulatory Visit: Payer: Self-pay | Admitting: Family Medicine

## 2023-12-20 MED ORDER — LISINOPRIL-HYDROCHLOROTHIAZIDE 20-12.5 MG PO TABS: 2.0000 | ORAL_TABLET | Freq: Every day | ORAL | 0 refills | Status: DC

## 2023-12-20 NOTE — Telephone Encounter (Signed)
 Prescription Request  12/20/2023  LOV: 04/03/2023  What is the name of the medication or equipment?   lisinopril -hydrochlorothiazide  (ZESTORETIC ) 20-12.5 MG tablet [[id number]]  **90 day script requested**  Have you contacted your pharmacy to request a refill? Yes   Which pharmacy would you like this sent to?  [id number] [address], [place] - [address] [place] [address] Phone: [phone] Fax: [phone]    Patient notified that their request is being sent to the clinical staff for review and that they should receive a response within 2 business days.   Please advise pharmacist.

## 2023-12-20 NOTE — Telephone Encounter (Signed)
 Needs appt

## 2023-12-20 NOTE — Telephone Encounter (Signed)
 courtesy refill given, OV needed for additional refills.  Requested Prescriptions  Pending Prescriptions Disp Refills   lisinopril -hydrochlorothiazide  (ZESTORETIC ) 20-12.5 MG tablet 60 tablet 0    Sig: Take 2 tablets by mouth daily.     Cardiovascular:  ACEI + Diuretic Combos Failed - 12/20/2023  9:41 AM      Failed - Na in normal range and within 180 days    Sodium  Date Value Ref Range Status  03/22/2023 139 135 - 146 mmol/L Final         Failed - K in normal range and within 180 days    Potassium  Date Value Ref Range Status  03/22/2023 3.4 (L) 3.5 - 5.3 mmol/L Final         Failed - Cr in normal range and within 180 days    Creat  Date Value Ref Range Status  03/22/2023 0.95 0.70 - 1.35 mg/dL Final   Creatinine, Urine  Date Value Ref Range Status  03/22/2023 35 20 - 320 mg/dL Final         Failed - eGFR is 30 or above and within 180 days    GFR, Est African American  Date Value Ref Range Status  12/16/2019 86 > OR = 60 mL/min/1.19m2 Final   GFR, Est Non African American  Date Value Ref Range Status  12/16/2019 74 > OR = 60 mL/min/1.75m2 Final   eGFR  Date Value Ref Range Status  03/22/2023 92 > OR = 60 mL/min/1.15m2 Final         Failed - Last BP in normal range    BP Readings from Last 1 Encounters:  07/06/23 (!) 150/82         Failed - Valid encounter within last 6 months    Recent Outpatient Visits           8 months ago Colon cancer screening   [facility] Duanne Butler DASEN, MD   1 year ago Type 2 diabetes mellitus with hyperosmolarity without coma, without long-term current use of insulin  [facility])   [facility] Duanne Butler DASEN, MD   1 year ago 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, unspecified type   [place] [facility] Duanne Butler DASEN, MD   1 year ago Routine general medical examination at a health care facility   [facility] Family Medicine Pickard,  Butler DASEN, MD              Passed - Patient is not pregnant

## 2023-12-20 NOTE — Telephone Encounter (Signed)
 Called patient to schedule OV. No answer, left VM to call back.

## 2023-12-21 NOTE — Telephone Encounter (Signed)
 Requested medications are due for refill today.  yes  Requested medications are on the active medications list.  yes  Last refill. 10/17/2022 #20 0 rf  Future visit scheduled.   yes  Notes to clinic.  Refill not delegated.     Requested Prescriptions  Pending Prescriptions Disp Refills   ondansetron  (ZOFRAN ) 4 MG tablet [Pharmacy Med Name: ONDANSETRON  HCL 4 MG TABLET] 9 tablet 2    Sig: TAKE 1 TABLET BY MOUTH EVERY 8 HOURS AS NEEDED FOR NAUSEA AND VOMITING     Not Delegated - Gastroenterology: Antiemetics - ondansetron  Failed - 12/21/2023  2:40 PM      Failed - This refill cannot be delegated      Failed - ALT in normal range and within 360 days    ALT  Date Value Ref Range Status  03/22/2023 47 (H) 9 - 46 U/L Final         Failed - Valid encounter within last 6 months    Recent Outpatient Visits           8 months ago Colon cancer screening   [place] [facility] Duanne Butler DASEN, MD   1 year ago Type 2 diabetes mellitus with hyperosmolarity without coma, without long-term current use of insulin  [facility])   [place] [facility] Pickard, Butler DASEN, MD   1 year ago 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

## 2024-01-01 ENCOUNTER — Other Ambulatory Visit: Payer: Self-pay

## 2024-01-01 ENCOUNTER — Telehealth: Payer: Self-pay | Admitting: Family Medicine

## 2024-01-01 DIAGNOSIS — E1165 Type 2 diabetes mellitus with hyperglycemia: Secondary | ICD-10-CM

## 2024-01-01 MED ORDER — METFORMIN HCL 1000 MG PO TABS
1000.0000 mg | ORAL_TABLET | Freq: Two times a day (BID) | ORAL | 1 refills | Status: AC
Start: 2024-01-01 — End: ?

## 2024-01-01 NOTE — Telephone Encounter (Signed)
 Prescription Request  01/01/2024  LOV: 04/03/2023  What is the name of the medication or equipment? metFORMIN  (GLUCOPHAGE ) 1000 MG tablet   Have you contacted your pharmacy to request a refill? Yes   Which pharmacy would you like this sent to?  [id number] [address], [place] - [address] [place] [address] Phone: [phone] Fax: [phone]    Patient notified that their request is being sent to the clinical staff for review and that they should receive a response within 2 business days.   Please advise at [facility] [phone]

## 2024-01-05 ENCOUNTER — Other Ambulatory Visit: Payer: Self-pay | Admitting: Family Medicine

## 2024-01-07 NOTE — Telephone Encounter (Signed)
 Requested medications are due for refill today.  yes  Requested medications are on the active medications list.  yes  Last refill. 07/25/2023 #90 1 rf  Future visit scheduled.   Yes - in December  Notes to clinic.  Pt last seen 12 /2024    Requested Prescriptions  Pending Prescriptions Disp Refills   amLODipine  (NORVASC ) 10 MG tablet [Pharmacy Med Name: AMLODIPINE  BESYLATE 10 MG TAB] 90 tablet 1    Sig: TAKE 1 TABLET BY MOUTH EVERY DAY     Cardiovascular: Calcium  Channel Blockers 2 Failed - 01/07/2024  1:27 PM      Failed - Last BP in normal range    BP Readings from Last 1 Encounters:  07/06/23 (!) 150/82         Failed - Valid encounter within last 6 months    Recent Outpatient Visits           9 months ago Colon cancer screening   [place] [facility] Duanne Butler DASEN, MD   1 year ago Type 2 diabetes mellitus with hyperosmolarity without coma, without long-term current use of insulin  [facility])   [place] [facility] Pickard, Butler DASEN, MD   1 year ago 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

## 2024-01-14 LAB — HM DIABETES EYE EXAM

## 2024-01-15 ENCOUNTER — Other Ambulatory Visit: Payer: Self-pay | Admitting: Family Medicine

## 2024-01-16 ENCOUNTER — Other Ambulatory Visit: Payer: Self-pay | Admitting: Family Medicine

## 2024-01-16 NOTE — Telephone Encounter (Signed)
 Requested medication (s) are due for refill today - yes  Requested medication (s) are on the active medication list -yes  Future visit scheduled -yes  Last refill: 11/07/23 2ml 1RF  Notes to clinic: off protocol- provider review   Requested Prescriptions  Pending Prescriptions Disp Refills   MOUNJARO  10 MG/0.5ML Pen [Pharmacy Med Name: MOUNJARO  10 MG/0.5 ML PEN]  1    Sig: INJECT 10 MG INTO THE SKIN ONE TIME PER WEEK     Off-Protocol Failed - 01/16/2024  1:38 PM      Failed - Medication not assigned to a protocol, review manually.      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      Passed - Valid encounter within last 12 months    Recent Outpatient Visits           9 months ago Colon cancer screening   [place] [facility] Duanne Butler DASEN, MD   1 year ago Type 2 diabetes mellitus with hyperosmolarity without coma, without long-term current use of insulin  [facility])   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

## 2024-01-21 ENCOUNTER — Ambulatory Visit: Admitting: Family Medicine

## 2024-01-21 ENCOUNTER — Telehealth: Payer: Self-pay

## 2024-01-21 ENCOUNTER — Encounter: Payer: Self-pay | Admitting: Family Medicine

## 2024-01-21 ENCOUNTER — Other Ambulatory Visit: Payer: Self-pay | Admitting: Family Medicine

## 2024-01-21 VITALS — BP 116/76 | HR 78 | Temp 97.9°F | Ht 68.0 in | Wt 231.0 lb

## 2024-01-21 DIAGNOSIS — E1165 Type 2 diabetes mellitus with hyperglycemia: Secondary | ICD-10-CM

## 2024-01-21 DIAGNOSIS — Z794 Long term (current) use of insulin: Secondary | ICD-10-CM

## 2024-01-21 DIAGNOSIS — Z23 Encounter for immunization: Secondary | ICD-10-CM

## 2024-01-21 MED ORDER — HYDROXYZINE HCL 25 MG PO TABS: 25.0000 mg | ORAL_TABLET | Freq: Three times a day (TID) | ORAL | 5 refills | Status: DC | PRN

## 2024-01-21 MED ORDER — PANTOPRAZOLE SODIUM 40 MG PO TBEC: 40.0000 mg | DELAYED_RELEASE_TABLET | Freq: Every day | ORAL | 3 refills | Status: AC

## 2024-01-21 MED ORDER — FREESTYLE LIBRE 2 PLUS SENSOR MISC
6 refills | Status: AC
Start: 2024-01-21 — End: ?

## 2024-01-21 NOTE — Telephone Encounter (Signed)
 Copied from CRM #[id number]. Topic: Clinical - Medication Refill >> Jan 21, 2024  3:06 PM [name] wrote: Medication: tirzepatide  (MOUNJARO ) 10 MG/0.5ML Pen  [facility] so he isn't charged $1k  If it is done through [facility] do 90 day supply for zero dollars  Has the patient contacted their pharmacy? Yes (Agent: If no, request that the patient contact the pharmacy for the refill. If patient does not wish to contact the pharmacy document the reason why and proceed with request.) (Agent: If yes, when and what did the pharmacy advise?)  This is the patient's preferred pharmacy:  [facility]  Their number: [phone] He didn't have address info  Is this the correct pharmacy for this prescription? Yes If no, delete pharmacy and type the correct one.   Has the prescription been filled recently? No  Is the patient out of the medication? Yes  Has the patient been seen for an appointment in the last year OR does the patient have an upcoming appointment? Yes  Can we respond through MyChart? Yes  Agent: Please be advised that Rx refills may take up to 3 business days. We ask that you follow-up with your pharmacy.

## 2024-01-21 NOTE — Telephone Encounter (Signed)
 Copied from CRM [id number]. Topic: Clinical - Prescription Issue >> Jan 21, 2024  3:10 PM [name] wrote: Reason for CRM: Patient called.. Pls put in an appeal for pantoprazole  (PROTONIX ) 40 MG tablet their number [phone] needs to go through [facility] - 90 day refill to be paid.  He was denied by ins.

## 2024-01-21 NOTE — Progress Notes (Signed)
 Subjective:    Patient ID: Joseph [name], male    DOB: 02-Dec-1960, 63 y.o.   MRN: [id number]  Patient reports recalcitrant heartburn.  He is on Nexium 20 mg a day however he is also taking Mounjaro  10 mg a day.  He states that he has better stomach contents occasionally, up in his throat.  He feels nauseated at times.  He is having daily heartburn.  He also reports trouble sleeping especially since his mother recently passed away.  Past Medical History:  Diagnosis Date  . Allergy   . Diabetes mellitus without complication (HCC)   . Hyperlipidemia   . Hypertension    Past Surgical History:  Procedure Laterality Date  . COLONOSCOPY  2015  . INSERTION OF MESH N/A 10/31/2017   Procedure: INSERTION OF MESH;  Surgeon: Vernetta Berg, MD;  Location: [facility];  Service: General;  Laterality: N/A;  . UMBILICAL HERNIA REPAIR N/A 10/31/2017   Procedure: UMBILICAL HERNIA REPAIR WITH MESH;  Surgeon: Vernetta Berg, MD;  Location: [facility];  Service: General;  Laterality: N/A;  . VASECTOMY  2006   Current Outpatient Medications on File Prior to Visit  Medication Sig Dispense Refill  . amLODipine  (NORVASC ) 10 MG tablet TAKE 1 TABLET BY MOUTH EVERY DAY 90 tablet 1  . aspirin  81 MG tablet Take 81 mg by mouth daily.    . atorvastatin  (LIPITOR) 40 MG tablet TAKE 1 TABLET BY MOUTH EVERYDAY AT BEDTIME 90 tablet 3  . B-D ULTRAFINE III SHORT PEN 31G X 8 MM MISC SMARTSIG:1 Each SUB-Q Daily    . blood glucose meter kit and supplies KIT Dispense based on patient and insurance preference. Check fasting blood sugar each morning.  E11.65 1 each 0  . Blood Glucose Monitoring Suppl (BLOOD GLUCOSE SYSTEM PAK) KIT Please dispense based on patient and insurance preference. Use as instructed to monitor FSBS 1x daily. Dx E11.9. 1 kit 1  . celecoxib  (CELEBREX ) 200 MG capsule Take 1 capsule (200 mg total) by mouth daily. 30 capsule 2  . Continuous Glucose Sensor (FREESTYLE LIBRE 2 SENSOR) MISC Use as directed to monitor  blood glucose. Dx: E11.9. 2 each 11  . empagliflozin  (JARDIANCE ) 25 MG TABS tablet Take 1 tablet (25 mg total) by mouth daily. 90 tablet 0  . esomeprazole (NEXIUM) 20 MG capsule Take 20 mg by mouth daily at 12 noon.    . glipiZIDE  (GLUCOTROL  XL) 10 MG 24 hr tablet Take 1 tablet (10 mg total) by mouth daily with breakfast. 90 tablet 2  . Insulin  Pen Needle (B-D ULTRAFINE III SHORT PEN) 31G X 8 MM MISC USE TO INJECT LANTUS  INTO THE SKIN DAILY AS NEEDED 100 each 11  . lisinopril -hydrochlorothiazide  (ZESTORETIC ) 20-12.5 MG tablet TAKE 2 TABLETS BY MOUTH EVERY DAY 60 tablet 0  . loratadine  (CLARITIN ) 10 MG tablet Take 10 mg by mouth daily as needed for allergies.    . metFORMIN  (GLUCOPHAGE ) 1000 MG tablet Take 1 tablet (1,000 mg total) by mouth 2 (two) times daily with a meal. 180 tablet 1  . niacin (VITAMIN B3) 500 MG tablet Take 1,000 mg by mouth daily at 6 (six) AM.    . ondansetron  (ZOFRAN ) 4 MG tablet TAKE 1 TABLET BY MOUTH EVERY 8 HOURS AS NEEDED FOR NAUSEA AND VOMITING 9 tablet 2  . OneTouch Delica Lancets 33G MISC USE AS DIRECTED EVERY DAY 100 each 1  . ONETOUCH ULTRA test strip TEST EVERY DAY AS DIRECTED 100 strip 12  . sildenafil  (VIAGRA ) 100  MG tablet TAKE 1/2 TO 1 (ONE-HALF TO ONE) TABLET BY MOUTH ONCE DAILY AS NEEDED FOR  ERECTILE  DYSFUNCTION 30 tablet 2  . tirzepatide  (MOUNJARO ) 10 MG/0.5ML Pen INJECT 10 MG INTO THE SKIN ONE TIME PER WEEK 2 mL 1  . TOUJEO  MAX SOLOSTAR 300 UNIT/ML Solostar Pen INJECT 60 UNITS UNDER THE SKIN DAILY 18 mL 3  . triamcinolone  (NASACORT  ALLERGY 24HR) 55 MCG/ACT AERO nasal inhaler Place 2 sprays into the nose daily.    . ZINC -VITAMIN C  PO Take 1 tablet by mouth daily. Plus vitamin D      Current Facility-Administered Medications on File Prior to Visit  Medication Dose Route Frequency Provider Last Rate Last Admin  . 0.9 %  sodium chloride  infusion  500 mL Intravenous Once Avram Lupita BRAVO, MD      . insulin  starter kit- pen needles (English) 1 kit  1 kit Other  Once Duanne Butler DASEN, MD          No Known Allergies Social History   Socioeconomic History  . Marital status: Single    Spouse name: Not on file  . Number of children: Not on file  . Years of education: Not on file  . Highest education level: Not on file  Occupational History  . Not on file  Tobacco Use  . Smoking status: Never  . Smokeless tobacco: Never  Vaping Use  . Vaping status: Never Used  Substance and Sexual Activity  . Alcohol use: No  . Drug use: No  . Sexual activity: Yes    Comment: married to [place].  Three kids.  exercising daily.  Other Topics Concern  . Not on file  Social History Narrative  . Not on file   Social Drivers of Health   Financial Resource Strain: Not on file  Food Insecurity: Not on file  Transportation Needs: Not on file  Physical Activity: Not on file  Stress: Not on file  Social Connections: Not on file  Intimate Partner Violence: Not on file     Review of Systems  All other systems reviewed and are negative.      Objective:   Physical Exam Vitals reviewed.  Constitutional:      Appearance: He is well-developed.  HENT:     Mouth/Throat:     Mouth: No injury, lacerations, oral lesions or angioedema.     Tongue: No lesions.  Cardiovascular:     Heart sounds: Normal heart sounds. No murmur heard.    No friction rub. No gallop.  Pulmonary:     Effort: Pulmonary effort is normal. No respiratory distress.     Breath sounds: Normal breath sounds. No stridor. No wheezing or rales.  Chest:     Chest wall: No tenderness.  Neurological:     Mental Status: He is alert and oriented to person, place, and time.     Cranial Nerves: No cranial nerve deficit.     Coordination: Coordination normal.         Assessment & Plan:  Type 2 diabetes mellitus with hyperglycemia, with long-term current use of insulin  (HCC) - Plan: Hemoglobin A1c, CBC with Differential/Platelet, Comprehensive metabolic panel with GFR, Lipid panel,  Microalbumin/Creatinine Ratio, Urine, Continuous Glucose Sensor (FREESTYLE LIBRE 2 PLUS SENSOR) MISC I feel the GERD is directly due to the Mounjaro  however the patient's personal A1c is around 6.7 so he wants to continue to Mounjaro .  Therefore we will switch Nexium to Protonix  40 mg a day and the patient will try  to eat less especially on the days when he takes Mounjaro .  We discussed medication to help with insomnia and I recommended trying hydroxyzine  25 mg every 8 hours as needed

## 2024-01-21 NOTE — Addendum Note (Signed)
 Addended by: ANGELENA RONAL BRADLEY K on: 01/21/2024 12:40 PM   Modules accepted: Orders

## 2024-01-22 ENCOUNTER — Other Ambulatory Visit: Payer: Self-pay

## 2024-01-22 ENCOUNTER — Other Ambulatory Visit: Payer: Self-pay | Admitting: Family Medicine

## 2024-01-22 ENCOUNTER — Ambulatory Visit: Payer: Self-pay | Admitting: Family Medicine

## 2024-01-22 ENCOUNTER — Other Ambulatory Visit (HOSPITAL_COMMUNITY): Payer: Self-pay

## 2024-01-22 ENCOUNTER — Telehealth: Payer: Self-pay | Admitting: Pharmacy Technician

## 2024-01-22 LAB — CBC WITH DIFFERENTIAL/PLATELET
Absolute Lymphocytes: 3242 {cells}/uL (ref 850–3900)
Absolute Monocytes: 1241 {cells}/uL — ABNORMAL HIGH (ref 200–950)
Basophils Absolute: 75 {cells}/uL (ref 0–200)
Basophils Relative: 0.7 %
Eosinophils Absolute: 920 {cells}/uL — ABNORMAL HIGH (ref 15–500)
Eosinophils Relative: 8.6 %
HCT: 48.1 % (ref 38.5–50.0)
Hemoglobin: 16 g/dL (ref 13.2–17.1)
MCH: 27 pg (ref 27.0–33.0)
MCHC: 33.3 g/dL (ref 32.0–36.0)
MCV: 81.3 fL (ref 80.0–100.0)
MPV: 11.2 fL (ref 7.5–12.5)
Monocytes Relative: 11.6 %
Neutro Abs: 5222 {cells}/uL (ref 1500–7800)
Neutrophils Relative %: 48.8 %
Platelets: 297 Thousand/uL (ref 140–400)
RBC: 5.92 Million/uL — ABNORMAL HIGH (ref 4.20–5.80)
RDW: 15.4 % — ABNORMAL HIGH (ref 11.0–15.0)
Total Lymphocyte: 30.3 %
WBC: 10.7 Thousand/uL (ref 3.8–10.8)

## 2024-01-22 LAB — COMPREHENSIVE METABOLIC PANEL WITH GFR
AG Ratio: 1.7 (calc) (ref 1.0–2.5)
ALT: 46 U/L (ref 9–46)
AST: 29 U/L (ref 10–35)
Albumin: 4.3 g/dL (ref 3.6–5.1)
Alkaline phosphatase (APISO): 61 U/L (ref 35–144)
BUN: 17 mg/dL (ref 7–25)
CO2: 25 mmol/L (ref 20–32)
Calcium: 9.4 mg/dL (ref 8.6–10.3)
Chloride: 103 mmol/L (ref 98–110)
Creat: 1.2 mg/dL (ref 0.70–1.35)
Globulin: 2.6 g/dL (ref 1.9–3.7)
Glucose, Bld: 136 mg/dL — ABNORMAL HIGH (ref 65–99)
Potassium: 3.3 mmol/L — ABNORMAL LOW (ref 3.5–5.3)
Sodium: 139 mmol/L (ref 135–146)
Total Bilirubin: 0.4 mg/dL (ref 0.2–1.2)
Total Protein: 6.9 g/dL (ref 6.1–8.1)
eGFR: 69 mL/min/1.73m2 (ref >=60)

## 2024-01-22 LAB — HEMOGLOBIN A1C
Hgb A1c MFr Bld: 7.6 % — ABNORMAL HIGH (ref <5.7)
Mean Plasma Glucose: 171 mg/dL
eAG (mmol/L): 9.5 mmol/L

## 2024-01-22 LAB — MICROALBUMIN / CREATININE URINE RATIO
Creatinine, Urine: 69 mg/dL (ref 20–320)
Microalb Creat Ratio: 65 mg/g{creat} — ABNORMAL HIGH (ref <30)
Microalb, Ur: 4.5 mg/dL

## 2024-01-22 LAB — LIPID PANEL
Cholesterol: 122 mg/dL (ref <200)
HDL: 37 mg/dL — ABNORMAL LOW (ref >=40)
LDL Cholesterol (Calc): 60 mg/dL
Non-HDL Cholesterol (Calc): 85 mg/dL (ref <130)
Total CHOL/HDL Ratio: 3.3 (calc) (ref <5.0)
Triglycerides: 173 mg/dL — ABNORMAL HIGH (ref <150)

## 2024-01-22 MED ORDER — MOUNJARO 10 MG/0.5ML ~~LOC~~ SOAJ: 10.0000 mg | SUBCUTANEOUS | 1 refills | Status: DC

## 2024-01-22 NOTE — Telephone Encounter (Signed)
 Pharmacy Patient Advocate Encounter   Received notification from Pt Calls Messages that prior authorization for Pantoprazole  40mg  tablets is required/requested.   Insurance verification completed.   The patient is insured through [employer] .   Per test claim: Refill too soon. PA is not needed at this time. Medication was filled 01/21/24. Next eligible fill date is 03/28/24.   The initial rejection stated that the patient has to use his mail order service for a 90 day supply but that he could fill a 30 day supply at retail pharmacy but it looks like he did fill a 90 day supply on yesterday per test bill.

## 2024-01-22 NOTE — Telephone Encounter (Signed)
 PA request has been Received. New Encounter has been or will be created for follow up. For additional info see Pharmacy Prior Auth telephone encounter from 01/22/24.

## 2024-01-22 NOTE — Telephone Encounter (Signed)
 Requested medications are due for refill today.  unsure  Requested medications are on the active medications list.  yes  Last refill. 06/09/2022  Future visit scheduled.   yes  Notes to clinic.  historical    Requested Prescriptions  Pending Prescriptions Disp Refills   BD PEN NEEDLE SHORT ULTRAFINE 31G X 8 MM MISC [Pharmacy Med Name: BD UF SHORT PEN NEEDLE [id number]] 100 each 11    Sig: USE TO INJECT LANTUS  INTO THE SKIN DAILY AS NEEDED     Endocrinology: Diabetes - Testing Supplies Passed - 01/22/2024  5:26 PM      Passed - Valid encounter within last 12 months    Recent Outpatient Visits           Yesterday Type 2 diabetes mellitus with hyperglycemia, with long-term current use of insulin  [facility])   [place] Pickard, Butler DASEN, MD   9 months ago Colon cancer screening   [place] [facility] Duanne Butler DASEN, MD   1 year ago Type 2 diabetes mellitus with hyperosmolarity without coma, without long-term current use of insulin  [facility])   [place] [facility] Pickard, Butler DASEN, MD   1 year ago 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

## 2024-02-12 ENCOUNTER — Other Ambulatory Visit: Payer: Self-pay

## 2024-02-12 NOTE — Telephone Encounter (Signed)
 Prescription Request  02/12/2024  LOV: 01/21/24   What is the name of the medication or equipment? lisinopril -hydrochlorothiazide  (ZESTORETIC ) 20-12.5 MG tablet [[id number]]   Have you contacted your pharmacy to request a refill? Yes   Which pharmacy would you like this sent to?  [id number] [address] [place], [place] - [address] [place] [address] Phone: [phone] Fax: [phone]    Patient notified that their request is being sent to the clinical staff for review and that they should receive a response within 2 business days.   Please advise at [facility] [phone]

## 2024-02-14 MED ORDER — LISINOPRIL-HYDROCHLOROTHIAZIDE 20-12.5 MG PO TABS: 2.0000 | ORAL_TABLET | Freq: Every day | ORAL | 0 refills | Status: DC

## 2024-02-14 NOTE — Telephone Encounter (Signed)
 Requested Prescriptions  Pending Prescriptions Disp Refills   lisinopril -hydrochlorothiazide  (ZESTORETIC ) 20-12.5 MG tablet 180 tablet 0    Sig: Take 2 tablets by mouth daily.     Cardiovascular:  ACEI + Diuretic Combos Failed - 02/14/2024 12:49 PM      Failed - K in normal range and within 180 days    Potassium  Date Value Ref Range Status  01/21/2024 3.3 (L) 3.5 - 5.3 mmol/L Final         Failed - Valid encounter within last 6 months    Recent Outpatient Visits           3 weeks ago Type 2 diabetes mellitus with hyperglycemia, with long-term current use of insulin  [facility])   [facility] Medicine Pickard, Butler DASEN, MD   10 months ago Colon cancer screening   [place] [facility] Medicine Duanne Butler DASEN, MD   1 year ago Type 2 diabetes mellitus with hyperosmolarity without coma, without long-term current use of insulin  [facility])   [facility] [facility] Medicine Pickard, Butler DASEN, MD   1 year ago 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, unspecified type   [place] [facility] Medicine Duanne Butler DASEN, MD   2 years ago Routine general medical examination at a health care facility   [facility] Duanne Butler DASEN, MD              Passed - Na in normal range and within 180 days    Sodium  Date Value Ref Range Status  01/21/2024 139 135 - 146 mmol/L Final         Passed - Cr in normal range and within 180 days    Creat  Date Value Ref Range Status  01/21/2024 1.20 0.70 - 1.35 mg/dL Final   Creatinine, Urine  Date Value Ref Range Status  01/21/2024 69 20 - 320 mg/dL Final         Passed - eGFR is 30 or above and within 180 days    GFR, Est African American  Date Value Ref Range Status  12/16/2019 86 > OR = 60 mL/min/1.25m2 Final   GFR, Est Non African American  Date Value Ref Range Status  12/16/2019 74 > OR = 60 mL/min/1.79m2 Final   eGFR  Date Value Ref Range Status  01/21/2024 69 >  OR = 60 mL/min/1.16m2 Final         Passed - Patient is not pregnant      Passed - Last BP in normal range    BP Readings from Last 1 Encounters:  01/21/24 116/76

## 2024-02-16 ENCOUNTER — Other Ambulatory Visit: Payer: Self-pay | Admitting: Family Medicine

## 2024-03-05 ENCOUNTER — Telehealth: Payer: Self-pay

## 2024-03-05 ENCOUNTER — Other Ambulatory Visit: Payer: Self-pay

## 2024-03-05 MED ORDER — EMPAGLIFLOZIN 25 MG PO TABS: 25.0000 mg | ORAL_TABLET | Freq: Every day | ORAL | 0 refills | Status: DC

## 2024-03-05 NOTE — Telephone Encounter (Signed)
 Sent in medication

## 2024-03-05 NOTE — Telephone Encounter (Signed)
 Prescription Request  03/05/2024  LOV: 01/21/24  What is the name of the medication or equipment? empagliflozin  (JARDIANCE ) 25 MG TABS tablet [[id number]]   Have you contacted your pharmacy to request a refill? Yes   Which pharmacy would you like this sent to?  [id number] [address], [place] - [address] [place] [address] Phone: [phone] Fax: [phone]    Patient notified that their request is being sent to the clinical staff for review and that they should receive a response within 2 business days.   Please advise at [facility] [phone]

## 2024-03-12 ENCOUNTER — Other Ambulatory Visit: Payer: Self-pay | Admitting: Family Medicine

## 2024-03-12 NOTE — Telephone Encounter (Signed)
 Prescription Request  03/12/2024  LOV: 01/21/2024  What is the name of the medication or equipment?   ondansetron  (ZOFRAN ) 4 MG tablet   Have you contacted your pharmacy to request a refill? Yes   Which pharmacy would you like this sent to?  [id number] [address], [place] - [address] [place] [address] Phone: [phone] Fax: [phone]    Patient notified that their request is being sent to the clinical staff for review and that they should receive a response within 2 business days.   Please advise pharmacist.

## 2024-03-14 ENCOUNTER — Other Ambulatory Visit: Payer: Self-pay

## 2024-03-14 ENCOUNTER — Telehealth: Payer: Self-pay

## 2024-03-14 MED ORDER — ONDANSETRON HCL 4 MG PO TABS: 4.0000 mg | ORAL_TABLET | Freq: Three times a day (TID) | ORAL | 2 refills | Status: AC | PRN

## 2024-03-14 NOTE — Telephone Encounter (Signed)
 Requested medications are due for refill today.  yes  Requested medications are on the active medications list.  yes  Last refill. 12/25/2023 #9 2 rf  Future visit scheduled.   yes  Notes to clinic.  Refill not delegated.    Requested Prescriptions  Pending Prescriptions Disp Refills   ondansetron  (ZOFRAN ) 4 MG tablet 9 tablet 2     Not Delegated - Gastroenterology: Antiemetics - ondansetron  Failed - 03/14/2024 11:58 AM      Failed - This refill cannot be delegated      Failed - Valid encounter within last 6 months    Recent Outpatient Visits           1 month ago Type 2 diabetes mellitus with hyperglycemia, with long-term current use of insulin  [facility])   [place] [facility] Pickard, Butler DASEN, MD   11 months ago Colon cancer screening   [facility] Medicine Duanne Butler DASEN, MD   1 year ago Type 2 diabetes mellitus with hyperosmolarity without coma, without long-term current use of insulin  [facility])   [facility] Medicine Pickard, Butler DASEN, MD   1 year ago 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

## 2024-03-14 NOTE — Telephone Encounter (Signed)
 Sent in medication

## 2024-03-14 NOTE — Telephone Encounter (Signed)
 Prescription Request  03/14/2024  LOV: 01/21/24  What is the name of the medication or equipment? ondansetron  (ZOFRAN ) 4 MG tablet [[id number]]   Have you contacted your pharmacy to request a refill? Yes   Which pharmacy would you like this sent to?  [id number] [address], [place] - [address]  [place] [address] Phone: [phone] Fax: [phone]    Patient notified that their request is being sent to the clinical staff for review and that they should receive a response within 2 business days.   Please advise at [facility] [phone]

## 2024-03-25 ENCOUNTER — Other Ambulatory Visit

## 2024-04-02 ENCOUNTER — Telehealth: Payer: Self-pay

## 2024-04-02 NOTE — Telephone Encounter (Signed)
 Prescription Request  04/02/2024  LOV: 01/21/24  What is the name of the medication or equipment? atorvastatin  (LIPITOR) 40 MG tablet [[id number]]   Have you contacted your pharmacy to request a refill? Yes   Which pharmacy would you like this sent to?  [id number] [address], [place] - [address] [place] [address] Phone: [phone] Fax: [phone]    Patient notified that their request is being sent to the clinical staff for review and that they should receive a response within 2 business days.   Please advise at [facility] [phone]

## 2024-04-03 ENCOUNTER — Telehealth: Payer: Self-pay | Admitting: Pharmacy Technician

## 2024-04-03 ENCOUNTER — Ambulatory Visit: Admitting: Family Medicine

## 2024-04-03 ENCOUNTER — Encounter: Payer: Self-pay | Admitting: Family Medicine

## 2024-04-03 ENCOUNTER — Other Ambulatory Visit (HOSPITAL_COMMUNITY): Payer: Self-pay

## 2024-04-03 VITALS — BP 132/72 | HR 76 | Temp 97.6°F | Ht 68.0 in | Wt 232.8 lb

## 2024-04-03 DIAGNOSIS — Z0000 Encounter for general adult medical examination without abnormal findings: Secondary | ICD-10-CM

## 2024-04-03 DIAGNOSIS — E1169 Type 2 diabetes mellitus with other specified complication: Secondary | ICD-10-CM

## 2024-04-03 DIAGNOSIS — E1165 Type 2 diabetes mellitus with hyperglycemia: Secondary | ICD-10-CM

## 2024-04-03 DIAGNOSIS — I10 Essential (primary) hypertension: Secondary | ICD-10-CM

## 2024-04-03 DIAGNOSIS — Z125 Encounter for screening for malignant neoplasm of prostate: Secondary | ICD-10-CM

## 2024-04-03 DIAGNOSIS — Z23 Encounter for immunization: Secondary | ICD-10-CM

## 2024-04-03 DIAGNOSIS — E1159 Type 2 diabetes mellitus with other circulatory complications: Secondary | ICD-10-CM

## 2024-04-03 MED ORDER — ALPRAZOLAM 0.5 MG PO TABS: 0.5000 mg | ORAL_TABLET | Freq: Every evening | ORAL | 0 refills | Status: AC | PRN

## 2024-04-03 MED ORDER — ATORVASTATIN CALCIUM 40 MG PO TABS: ORAL_TABLET | ORAL | 3 refills | Status: AC

## 2024-04-03 MED ORDER — TOUJEO MAX SOLOSTAR 300 UNIT/ML ~~LOC~~ SOPN: 60.0000 [IU] | PEN_INJECTOR | Freq: Every day | SUBCUTANEOUS | 3 refills | Status: AC

## 2024-04-03 MED ORDER — MOUNJARO 10 MG/0.5ML ~~LOC~~ SOAJ: 10.0000 mg | SUBCUTANEOUS | 1 refills | Status: DC

## 2024-04-03 NOTE — Addendum Note (Signed)
 Addended by: ANGELENA RONAL BRADLEY K on: 04/03/2024 09:00 AM   Modules accepted: Orders

## 2024-04-03 NOTE — Progress Notes (Signed)
 Subjective:    Patient ID: Joseph [name], male    DOB: [birth year], 63 y.o.   MRN: [id number]  Patient is here today for complete physical exam.  Patient continues to have trouble sleeping since his mother died.  He has been trying to use hydroxyzine  however the medication leaves him feeling very groggy the next day.  We discussed his options and he would like to use a low-dose of alprazolam sparingly as needed.  His colonoscopy is up-to-date.  He is due for prostate cancer screening.  He is currently on 60 units of Toujeo .  He reports time in range almost 80% of the time.  His calculated A1c is 6.8.  He is on glipizide  and I question if that medication is doing anything to help him.  Diabetic foot exam was performed today and is normal.  He is due for the shingles vaccine as well as a COVID shot.  Blood pressure today is excellent at 132/72.  Immunization History  Administered Date(s) Administered   Influenza, Seasonal, Injecte, Preservative Fre 04/03/2023, 01/21/2024   Influenza,inj,Quad PF,6+ Mos 01/17/2013, 03/06/2014, 03/17/2016, 03/07/2017, 01/18/2018, 01/31/2019, 03/04/2020, 01/27/2021, 02/09/2022   Pneumococcal Polysaccharide-23 10/19/2017   Td 02/08/2004   Tdap 01/06/2011, 04/03/2023     Past Medical History:  Diagnosis Date   Allergy    Diabetes mellitus without complication (HCC)    Hyperlipidemia    Hypertension    Past Surgical History:  Procedure Laterality Date   COLONOSCOPY  2015   INSERTION OF MESH N/A 10/31/2017   Procedure: INSERTION OF MESH;  Surgeon: Vernetta Berg, MD;  Location: [facility];  Service: General;  Laterality: N/A;   UMBILICAL HERNIA REPAIR N/A 10/31/2017   Procedure: UMBILICAL HERNIA REPAIR WITH MESH;  Surgeon: Vernetta Berg, MD;  Location: [facility];  Service: General;  Laterality: N/A;   VASECTOMY  2006   Current Outpatient Medications on File Prior to Visit  Medication Sig Dispense Refill   amLODipine  (NORVASC ) 10 MG tablet TAKE 1 TABLET BY MOUTH  EVERY DAY 90 tablet 1   aspirin  81 MG tablet Take 81 mg by mouth daily.     atorvastatin  (LIPITOR) 40 MG tablet TAKE 1 TABLET BY MOUTH EVERYDAY AT BEDTIME 90 tablet 3   B-D ULTRAFINE III SHORT PEN 31G X 8 MM MISC SMARTSIG:1 Each SUB-Q Daily     BD PEN NEEDLE SHORT ULTRAFINE 31G X 8 MM MISC USE TO INJECT LANTUS  INTO THE SKIN DAILY AS NEEDED 100 each 11   blood glucose meter kit and supplies KIT Dispense based on patient and insurance preference. Check fasting blood sugar each morning.  E11.65 1 each 0   Blood Glucose Monitoring Suppl (BLOOD GLUCOSE SYSTEM PAK) KIT Please dispense based on patient and insurance preference. Use as instructed to monitor FSBS 1x daily. Dx E11.9. 1 kit 1   celecoxib  (CELEBREX ) 200 MG capsule TAKE 1 CAPSULE BY MOUTH EVERY DAY 30 capsule 2   Continuous Glucose Sensor (FREESTYLE LIBRE 2 PLUS SENSOR) MISC Change sensor every 15 days. 2 each 6   empagliflozin  (JARDIANCE ) 25 MG TABS tablet Take 1 tablet (25 mg total) by mouth daily. 90 tablet 0   glipiZIDE  (GLUCOTROL  XL) 10 MG 24 hr tablet Take 1 tablet (10 mg total) by mouth daily with breakfast. 90 tablet 2   hydrOXYzine  (ATARAX ) 25 MG tablet Take 1 tablet (25 mg total) by mouth every 8 (eight) hours as needed for anxiety. 30 tablet 5   lisinopril -hydrochlorothiazide  (ZESTORETIC ) 20-12.5 MG tablet Take 2 tablets  by mouth daily. 180 tablet 0   loratadine  (CLARITIN ) 10 MG tablet Take 10 mg by mouth daily as needed for allergies.     metFORMIN  (GLUCOPHAGE ) 1000 MG tablet Take 1 tablet (1,000 mg total) by mouth 2 (two) times daily with a meal. 180 tablet 1   niacin (VITAMIN B3) 500 MG tablet Take 1,000 mg by mouth daily at 6 (six) AM.     ondansetron  (ZOFRAN ) 4 MG tablet Take 1 tablet (4 mg total) by mouth every 8 (eight) hours as needed for nausea or vomiting. 9 tablet 2   OneTouch Delica Lancets 33G MISC USE AS DIRECTED EVERY DAY 100 each 1   ONETOUCH ULTRA test strip TEST EVERY DAY AS DIRECTED 100 strip 12   pantoprazole   (PROTONIX ) 40 MG tablet Take 1 tablet (40 mg total) by mouth daily. 90 tablet 3   sildenafil  (VIAGRA ) 100 MG tablet TAKE 1/2 TO 1 (ONE-HALF TO ONE) TABLET BY MOUTH ONCE DAILY AS NEEDED FOR  ERECTILE  DYSFUNCTION 30 tablet 2   tirzepatide  (MOUNJARO ) 10 MG/0.5ML Pen Inject 10 mg into the skin once a week. 6 mL 1   TOUJEO  MAX SOLOSTAR 300 UNIT/ML Solostar Pen INJECT 60 UNITS UNDER THE SKIN DAILY 18 mL 3   triamcinolone  (NASACORT  ALLERGY 24HR) 55 MCG/ACT AERO nasal inhaler Place 2 sprays into the nose daily.     ZINC -VITAMIN C  PO Take 1 tablet by mouth daily. Plus vitamin D      Current Facility-Administered Medications on File Prior to Visit  Medication Dose Route Frequency Provider Last Rate Last Admin   0.9 %  sodium chloride  infusion  500 mL Intravenous Once Avram Lupita BRAVO, MD       insulin  starter kit- pen needles (English) 1 kit  1 kit Other Once Duanne Butler DASEN, MD       No Known Allergies Social History   Socioeconomic History   Marital status: Single    Spouse name: Not on file   Number of children: Not on file   Years of education: Not on file   Highest education level: Not on file  Occupational History   Not on file  Tobacco Use   Smoking status: Never   Smokeless tobacco: Never  Vaping Use   Vaping status: Never Used  Substance and Sexual Activity   Alcohol use: No   Drug use: No   Sexual activity: Yes    Comment: married to [place].  Three kids.  exercising daily.  Other Topics Concern   Not on file  Social History Narrative   Not on file   Social Drivers of Health   Financial Resource Strain: Not on file  Food Insecurity: Not on file  Transportation Needs: Not on file  Physical Activity: Not on file  Stress: Not on file  Social Connections: Not on file  Intimate Partner Violence: Not on file   Family History  Problem Relation Age of Onset   Heart disease Mother    Heart disease Father    Stroke Father    Hypertension Sister    Colon cancer Neg Hx     Rectal cancer Neg Hx    Stomach cancer Neg Hx       Review of Systems  Respiratory:  Negative for apnea, cough, choking, chest tightness, shortness of breath and wheezing.   Cardiovascular:  Negative for chest pain, palpitations and leg swelling.  Gastrointestinal:  Negative for abdominal distention, abdominal pain and diarrhea.  Genitourinary:  Negative for difficulty urinating,  dysuria, frequency and hematuria.  All other systems reviewed and are negative.      Objective:   Physical Exam Vitals reviewed.  Constitutional:      Appearance: He is well-developed. He is not diaphoretic.  HENT:     Head: Normocephalic and atraumatic.     Right Ear: External ear normal.     Left Ear: External ear normal.     Nose: Nose normal.     Mouth/Throat:     Pharynx: No oropharyngeal exudate.  Eyes:     General: No scleral icterus.       Right eye: No discharge.        Left eye: No discharge.     Conjunctiva/sclera: Conjunctivae normal.     Pupils: Pupils are equal, round, and reactive to light.  Neck:     Thyroid: No thyromegaly.     Vascular: No JVD.     Trachea: No tracheal deviation.  Cardiovascular:     Rate and Rhythm: Normal rate and regular rhythm.     Heart sounds: Normal heart sounds. No murmur heard.    No friction rub. No gallop.  Pulmonary:     Effort: Pulmonary effort is normal. No respiratory distress.     Breath sounds: Normal breath sounds. No stridor. No wheezing or rales.  Chest:     Chest wall: No tenderness.  Abdominal:     General: Bowel sounds are normal. There is no distension.     Palpations: Abdomen is soft. There is no mass.     Tenderness: There is no abdominal tenderness. There is no guarding or rebound.  Musculoskeletal:        General: No tenderness. Normal range of motion.     Cervical back: Normal range of motion and neck supple.  Lymphadenopathy:     Cervical: No cervical adenopathy.  Skin:    General: Skin is warm.     Coloration: Skin is  not pale.     Findings: No erythema or rash.  Neurological:     Mental Status: He is alert and oriented to person, place, and time.     Cranial Nerves: No cranial nerve deficit.     Motor: No abnormal muscle tone.     Coordination: Coordination normal.     Deep Tendon Reflexes: Reflexes are normal and symmetric.  Psychiatric:        Behavior: Behavior normal.        Thought Content: Thought content normal.        Judgment: Judgment normal.           Assessment & Plan:   Routine general medical examination at a health care facility  Essential hypertension - Plan: Hemoglobin A1c, CBC with Differential/Platelet, Comprehensive metabolic panel with GFR, Lipid panel, Microalbumin / creatinine urine ratio  Prostate cancer screening - Plan: PSA  Hypertension associated with type 2 diabetes mellitus (HCC) - Plan: Hemoglobin A1c, CBC with Differential/Platelet, Comprehensive metabolic panel with GFR, Lipid panel, Microalbumin / creatinine urine ratio  Type 2 diabetes mellitus with hyperglycemia, with long-term current use of insulin  (HCC) - Plan: tirzepatide  (MOUNJARO ) 10 MG/0.5ML Pen, insulin  glargine, 2 Unit Dial, (TOUJEO  MAX SOLOSTAR) 300 UNIT/ML Solostar Pen  Hyperlipidemia associated with type 2 diabetes mellitus (HCC) - Plan: atorvastatin  (LIPITOR) 40 MG tablet I am very happy with his blood pressure today.  Patient's time in range appears excellent on 60 units of Toujeo .  Given the fact that he is on insulin , I do not feel glipizide   is beneficial any longer.  Therefore I will have the patient discontinue glipizide  and recheck sugars in 2 weeks to see if there has been any negative impact.  I will screen for prostate cancer with a PSA.  Screening his cholesterol with a lipid panel.  Goal LDL is less than 100.  Check a urine microalbumin to creatinine ratio to monitor for any nephropathy.  We will use Ativan 0.5 mg p.o. nightly as needed insomnia as needed.  Patient received the shingles  vaccine today.  Recommended the COVID-vaccine.  Diabetic foot exam was normal

## 2024-04-03 NOTE — Telephone Encounter (Signed)
 Pharmacy Patient Advocate Encounter   Received notification from Onbase that prior authorization for Toujeo  Max Solostar 300 unit/ml is required/requested.   Insurance verification completed.   The patient is insured through [facility].   Per test claim: The current 30 day co-pay is, $49.25.  No PA needed at this time. This test claim was processed through [facility]- copay amounts may vary at other pharmacies due to pharmacy/plan contracts, or as the patient moves through the different stages of their insurance plan.      **Patient's plan will only pay for 30 day supply at retail level**

## 2024-04-03 NOTE — Telephone Encounter (Signed)
 Already refilled on 04/03/24 in a separate refill encounter.

## 2024-04-04 ENCOUNTER — Ambulatory Visit: Payer: Self-pay | Admitting: Family Medicine

## 2024-04-04 ENCOUNTER — Telehealth: Payer: Self-pay

## 2024-04-04 ENCOUNTER — Other Ambulatory Visit (HOSPITAL_COMMUNITY): Payer: Self-pay

## 2024-04-04 DIAGNOSIS — E1100 Type 2 diabetes mellitus with hyperosmolarity without nonketotic hyperglycemic-hyperosmolar coma (NKHHC): Secondary | ICD-10-CM

## 2024-04-04 MED ORDER — PIOGLITAZONE HCL 30 MG PO TABS: 30.0000 mg | ORAL_TABLET | Freq: Every day | ORAL | 1 refills | Status: AC

## 2024-04-04 NOTE — Telephone Encounter (Signed)
 Good afternoon Sheena, I did see his new card but that's for medical, when I did an eligibility check for prescription benefits it's the same card that's already on file for his prescriptions and his toujeo  and mounjaro  both went through his prescription card successfully

## 2024-04-05 LAB — CBC WITH DIFFERENTIAL/PLATELET
Absolute Lymphocytes: 2926 {cells}/uL (ref 850–3900)
Absolute Monocytes: 1221 {cells}/uL — ABNORMAL HIGH (ref 200–950)
Basophils Absolute: 88 {cells}/uL (ref 0–200)
Basophils Relative: 0.8 %
Eosinophils Absolute: 506 {cells}/uL — ABNORMAL HIGH (ref 15–500)
Eosinophils Relative: 4.6 %
HCT: 49.4 % (ref 39.4–51.1)
Hemoglobin: 16.2 g/dL (ref 13.2–17.1)
MCH: 26.6 pg — ABNORMAL LOW (ref 27.0–33.0)
MCHC: 32.8 g/dL (ref 31.6–35.4)
MCV: 81.3 fL — ABNORMAL LOW (ref 81.4–101.7)
MPV: 10.9 fL (ref 7.5–12.5)
Monocytes Relative: 11.1 %
Neutro Abs: 6259 {cells}/uL (ref 1500–7800)
Neutrophils Relative %: 56.9 %
Platelets: 337 Thousand/uL (ref 140–400)
RBC: 6.08 Million/uL — ABNORMAL HIGH (ref 4.20–5.80)
RDW: 15.3 % — ABNORMAL HIGH (ref 11.0–15.0)
Total Lymphocyte: 26.6 %
WBC: 11 Thousand/uL — ABNORMAL HIGH (ref 3.8–10.8)

## 2024-04-05 LAB — LIPID PANEL
Cholesterol: 119 mg/dL (ref <200)
HDL: 37 mg/dL — ABNORMAL LOW (ref >=40)
LDL Cholesterol (Calc): 55 mg/dL
Non-HDL Cholesterol (Calc): 82 mg/dL (ref <130)
Total CHOL/HDL Ratio: 3.2 (calc) (ref <5.0)
Triglycerides: 194 mg/dL — ABNORMAL HIGH (ref <150)

## 2024-04-05 LAB — MICROALBUMIN / CREATININE URINE RATIO
Creatinine, Urine: 43 mg/dL (ref 20–320)
Microalb Creat Ratio: 126 mg/g{creat} — ABNORMAL HIGH (ref <30)
Microalb, Ur: 5.4 mg/dL

## 2024-04-05 LAB — HEMOGLOBIN A1C
Hgb A1c MFr Bld: 7.8 % — ABNORMAL HIGH (ref <5.7)
Mean Plasma Glucose: 177 mg/dL
eAG (mmol/L): 9.8 mmol/L

## 2024-04-05 LAB — PSA: PSA: 1.08 ng/mL (ref <=4.00)

## 2024-04-05 LAB — COMPREHENSIVE METABOLIC PANEL WITH GFR
AG Ratio: 1.8 (calc) (ref 1.0–2.5)
ALT: 55 U/L — ABNORMAL HIGH (ref 9–46)
AST: 28 U/L (ref 10–35)
Albumin: 4.6 g/dL (ref 3.6–5.1)
Alkaline phosphatase (APISO): 63 U/L (ref 35–144)
BUN: 18 mg/dL (ref 7–25)
CO2: 25 mmol/L (ref 20–32)
Calcium: 9.3 mg/dL (ref 8.6–10.3)
Chloride: 102 mmol/L (ref 98–110)
Creat: 1.17 mg/dL (ref 0.70–1.35)
Globulin: 2.6 g/dL (ref 1.9–3.7)
Glucose, Bld: 134 mg/dL — ABNORMAL HIGH (ref 65–99)
Potassium: 3.4 mmol/L — ABNORMAL LOW (ref 3.5–5.3)
Sodium: 139 mmol/L (ref 135–146)
Total Bilirubin: 0.4 mg/dL (ref 0.2–1.2)
Total Protein: 7.2 g/dL (ref 6.1–8.1)
eGFR: 71 mL/min/1.73m2 (ref >=60)

## 2024-04-07 ENCOUNTER — Other Ambulatory Visit (HOSPITAL_COMMUNITY): Payer: Self-pay

## 2024-04-07 NOTE — Telephone Encounter (Signed)
Good morning, you're welcome!!

## 2024-05-01 ENCOUNTER — Other Ambulatory Visit: Payer: Self-pay | Admitting: Family Medicine

## 2024-05-07 ENCOUNTER — Other Ambulatory Visit: Payer: Self-pay | Admitting: Family Medicine

## 2024-05-11 ENCOUNTER — Other Ambulatory Visit: Payer: Self-pay | Admitting: Family Medicine

## 2024-05-14 ENCOUNTER — Other Ambulatory Visit: Payer: Self-pay | Admitting: Family Medicine

## 2024-05-14 NOTE — Telephone Encounter (Signed)
 Prescription Request  05/14/2024  LOV: 04/03/2024  What is the name of the medication or equipment?   celecoxib  (CELEBREX ) 200 MG capsule   Have you contacted your pharmacy to request a refill? Yes   Which pharmacy would you like this sent to?  [id number] [address], [place] - [address] [place] [address] Phone: [phone] Fax: [phone]    Patient notified that their request is being sent to the clinical staff for review and that they should receive a response within 2 business days.   Please advise pharmacist.

## 2024-05-15 ENCOUNTER — Other Ambulatory Visit: Payer: Self-pay | Admitting: Family Medicine

## 2024-05-15 MED ORDER — CELECOXIB 200 MG PO CAPS: 200.0000 mg | ORAL_CAPSULE | Freq: Every day | ORAL | 2 refills | Status: AC

## 2024-05-15 MED ORDER — OZEMPIC (0.25 OR 0.5 MG/DOSE) 2 MG/3ML ~~LOC~~ SOPN: 0.5000 mg | PEN_INJECTOR | SUBCUTANEOUS | 1 refills | Status: DC

## 2024-05-15 NOTE — Telephone Encounter (Signed)
 Requested Prescriptions  Pending Prescriptions Disp Refills   celecoxib  (CELEBREX ) 200 MG capsule 30 capsule 2    Sig: Take by mouth daily.     Analgesics:  COX2 Inhibitors Failed - 05/15/2024  1:41 PM      Failed - Manual Review: Labs are only required if the patient has taken medication for more than 8 weeks.      Failed - ALT in normal range and within 360 days    ALT  Date Value Ref Range Status  04/03/2024 55 (H) 9 - 46 U/L Final         Passed - HGB in normal range and within 360 days    Hemoglobin  Date Value Ref Range Status  04/03/2024 16.2 13.2 - 17.1 g/dL Final         Passed - Cr in normal range and within 360 days    Creat  Date Value Ref Range Status  04/03/2024 1.17 0.70 - 1.35 mg/dL Final   Creatinine, Urine  Date Value Ref Range Status  04/03/2024 43 20 - 320 mg/dL Final         Passed - HCT in normal range and within 360 days    HCT  Date Value Ref Range Status  04/03/2024 49.4 39.4 - 51.1 % Final         Passed - AST in normal range and within 360 days    AST  Date Value Ref Range Status  04/03/2024 28 10 - 35 U/L Final         Passed - eGFR is 30 or above and within 360 days    GFR, Est African American  Date Value Ref Range Status  12/16/2019 86 > OR = 60 mL/min/1.75m2 Final   GFR, Est Non African American  Date Value Ref Range Status  12/16/2019 74 > OR = 60 mL/min/1.35m2 Final   eGFR  Date Value Ref Range Status  04/03/2024 71 > OR = 60 mL/min/1.78m2 Final         Passed - Patient is not pregnant      Passed - Valid encounter within last 12 months    Recent Outpatient Visits           1 month ago Routine general medical examination at a health care facility   [place] [facility] Medicine Duanne Butler DASEN, MD   3 months ago Type 2 diabetes mellitus with hyperglycemia, with long-term current use of insulin  [facility])   [place] [facility] Family Medicine Pickard, Butler DASEN, MD   1 year ago Colon cancer screening    [place] [facility] Duanne Butler DASEN, MD   1 year ago Type 2 diabetes mellitus with hyperosmolarity without coma, without long-term current use of insulin  [facility])   [place] [facility] Pickard, Butler DASEN, MD   1 year ago 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

## 2024-05-30 ENCOUNTER — Other Ambulatory Visit: Payer: Self-pay

## 2024-05-30 ENCOUNTER — Other Ambulatory Visit: Payer: Self-pay | Admitting: Family Medicine

## 2024-05-30 MED ORDER — SEMAGLUTIDE (1 MG/DOSE) 4 MG/3ML ~~LOC~~ SOPN: 1.0000 mg | PEN_INJECTOR | SUBCUTANEOUS | 1 refills | Status: AC

## 2024-06-04 ENCOUNTER — Ambulatory Visit

## 2024-07-02 ENCOUNTER — Ambulatory Visit

## 2024-10-03 ENCOUNTER — Other Ambulatory Visit
